# Patient Record
Sex: Male | Born: 1970 | Race: Black or African American | Hispanic: No | Smoking: Current every day smoker
Health system: Southern US, Community
[De-identification: ages and names within clinical notes are randomized; demographics above are authoritative.]

## PROBLEM LIST (undated history)

## (undated) DIAGNOSIS — K08409 Partial loss of teeth, unspecified cause, unspecified class: Secondary | ICD-10-CM

## (undated) DIAGNOSIS — K219 Gastro-esophageal reflux disease without esophagitis: Secondary | ICD-10-CM

## (undated) DIAGNOSIS — I10 Essential (primary) hypertension: Secondary | ICD-10-CM

## (undated) DIAGNOSIS — R251 Tremor, unspecified: Secondary | ICD-10-CM

## (undated) DIAGNOSIS — K649 Unspecified hemorrhoids: Secondary | ICD-10-CM

## (undated) DIAGNOSIS — E119 Type 2 diabetes mellitus without complications: Secondary | ICD-10-CM

## (undated) DIAGNOSIS — M542 Cervicalgia: Secondary | ICD-10-CM

## (undated) DIAGNOSIS — R0989 Other specified symptoms and signs involving the circulatory and respiratory systems: Secondary | ICD-10-CM

## (undated) DIAGNOSIS — Z8659 Personal history of other mental and behavioral disorders: Secondary | ICD-10-CM

## (undated) HISTORY — PX: OTHER SURGICAL HISTORY: SHX169

## (undated) HISTORY — PX: HEMORRHOID BANDING: SHX5850

## (undated) HISTORY — DX: Tremor, unspecified: R25.1

## (undated) HISTORY — DX: Unspecified hemorrhoids: K64.9

## (undated) HISTORY — DX: Other specified symptoms and signs involving the circulatory and respiratory systems: R09.89

## (undated) HISTORY — DX: Partial loss of teeth, unspecified cause, unspecified class: K08.409

---

## 2001-11-30 ENCOUNTER — Encounter: Payer: Self-pay | Admitting: Emergency Medicine

## 2001-11-30 ENCOUNTER — Emergency Department (HOSPITAL_COMMUNITY): Admission: EM | Admit: 2001-11-30 | Discharge: 2001-11-30 | Payer: Self-pay | Admitting: Emergency Medicine

## 2003-11-30 ENCOUNTER — Emergency Department (HOSPITAL_COMMUNITY): Admission: EM | Admit: 2003-11-30 | Discharge: 2003-11-30 | Payer: Self-pay | Admitting: Emergency Medicine

## 2004-03-05 ENCOUNTER — Emergency Department (HOSPITAL_COMMUNITY): Admission: EM | Admit: 2004-03-05 | Discharge: 2004-03-05 | Payer: Self-pay | Admitting: Emergency Medicine

## 2004-04-24 ENCOUNTER — Emergency Department (HOSPITAL_COMMUNITY): Admission: EM | Admit: 2004-04-24 | Discharge: 2004-04-24 | Payer: Self-pay | Admitting: Emergency Medicine

## 2004-11-10 ENCOUNTER — Ambulatory Visit (HOSPITAL_COMMUNITY): Admission: RE | Admit: 2004-11-10 | Discharge: 2004-11-10 | Payer: Self-pay | Admitting: Family Medicine

## 2005-07-31 ENCOUNTER — Ambulatory Visit: Payer: Self-pay | Admitting: Family Medicine

## 2005-08-14 ENCOUNTER — Ambulatory Visit: Payer: Self-pay | Admitting: Family Medicine

## 2005-08-15 ENCOUNTER — Ambulatory Visit: Payer: Self-pay | Admitting: Cardiology

## 2005-08-15 ENCOUNTER — Ambulatory Visit (HOSPITAL_COMMUNITY): Admission: RE | Admit: 2005-08-15 | Discharge: 2005-08-15 | Payer: Self-pay | Admitting: Family Medicine

## 2005-08-21 ENCOUNTER — Emergency Department (HOSPITAL_COMMUNITY): Admission: EM | Admit: 2005-08-21 | Discharge: 2005-08-21 | Payer: Self-pay | Admitting: Emergency Medicine

## 2005-08-28 ENCOUNTER — Ambulatory Visit (HOSPITAL_COMMUNITY): Admission: RE | Admit: 2005-08-28 | Discharge: 2005-08-28 | Payer: Self-pay | Admitting: Family Medicine

## 2005-08-28 ENCOUNTER — Ambulatory Visit: Payer: Self-pay | Admitting: Family Medicine

## 2007-11-10 ENCOUNTER — Emergency Department (HOSPITAL_COMMUNITY): Admission: EM | Admit: 2007-11-10 | Discharge: 2007-11-10 | Payer: Self-pay | Admitting: Emergency Medicine

## 2008-01-21 ENCOUNTER — Encounter: Payer: Self-pay | Admitting: Orthopedic Surgery

## 2008-01-21 ENCOUNTER — Emergency Department (HOSPITAL_COMMUNITY): Admission: EM | Admit: 2008-01-21 | Discharge: 2008-01-21 | Payer: Self-pay | Admitting: Emergency Medicine

## 2008-01-30 ENCOUNTER — Ambulatory Visit: Payer: Self-pay | Admitting: Orthopedic Surgery

## 2008-01-30 DIAGNOSIS — M79609 Pain in unspecified limb: Secondary | ICD-10-CM

## 2008-09-29 ENCOUNTER — Emergency Department (HOSPITAL_COMMUNITY): Admission: EM | Admit: 2008-09-29 | Discharge: 2008-09-29 | Payer: Self-pay | Admitting: Emergency Medicine

## 2009-02-22 ENCOUNTER — Emergency Department (HOSPITAL_COMMUNITY): Admission: EM | Admit: 2009-02-22 | Discharge: 2009-02-23 | Payer: Self-pay | Admitting: Family Medicine

## 2009-10-10 ENCOUNTER — Emergency Department (HOSPITAL_COMMUNITY): Admission: EM | Admit: 2009-10-10 | Discharge: 2009-10-11 | Payer: Self-pay | Admitting: Emergency Medicine

## 2009-10-31 ENCOUNTER — Emergency Department (HOSPITAL_COMMUNITY): Admission: EM | Admit: 2009-10-31 | Discharge: 2009-10-31 | Payer: Self-pay | Admitting: Emergency Medicine

## 2010-06-25 LAB — URINALYSIS, ROUTINE W REFLEX MICROSCOPIC
Bilirubin Urine: NEGATIVE
Ketones, ur: NEGATIVE mg/dL
Nitrite: NEGATIVE
Protein, ur: NEGATIVE mg/dL
Specific Gravity, Urine: 1.02 (ref 1.005–1.030)
pH: 5.5 (ref 5.0–8.0)

## 2010-06-25 LAB — URINE MICROSCOPIC-ADD ON

## 2010-06-25 LAB — URINE CULTURE

## 2010-06-26 LAB — DIFFERENTIAL
Basophils Absolute: 0 10*3/uL (ref 0.0–0.1)
Eosinophils Absolute: 0.5 10*3/uL (ref 0.0–0.7)
Lymphs Abs: 3.2 10*3/uL (ref 0.7–4.0)
Neutro Abs: 2.7 10*3/uL (ref 1.7–7.7)

## 2010-06-26 LAB — CBC
HCT: 44.9 % (ref 39.0–52.0)
MCH: 30.9 pg (ref 26.0–34.0)
MCHC: 34.9 g/dL (ref 30.0–36.0)
RDW: 13.7 % (ref 11.5–15.5)

## 2010-06-26 LAB — POCT CARDIAC MARKERS
CKMB, poc: 1.1 ng/mL (ref 1.0–8.0)
Myoglobin, poc: 113 ng/mL (ref 12–200)

## 2010-06-26 LAB — BASIC METABOLIC PANEL
Chloride: 102 mEq/L (ref 96–112)
Creatinine, Ser: 1.33 mg/dL (ref 0.4–1.5)
GFR calc Af Amer: >60 mL/min (ref >60)
Potassium: 3.4 mEq/L — ABNORMAL LOW (ref 3.5–5.1)

## 2010-07-13 LAB — BASIC METABOLIC PANEL
BUN: 10 mg/dL (ref 6–23)
CO2: 30 mEq/L (ref 19–32)
Calcium: 9.7 mg/dL (ref 8.4–10.5)
Chloride: 102 mEq/L (ref 96–112)
GFR calc Af Amer: >60 mL/min (ref >60)
GFR calc non Af Amer: >60 mL/min (ref >60)
Glucose, Bld: 170 mg/dL — ABNORMAL HIGH (ref 70–99)
Potassium: 3.3 mEq/L — ABNORMAL LOW (ref 3.5–5.1)

## 2010-07-13 LAB — DIFFERENTIAL
Basophils Absolute: 0 10*3/uL (ref 0.0–0.1)
Basophils Relative: 1 % (ref 0–1)
Lymphs Abs: 3.1 10*3/uL (ref 0.7–4.0)
Monocytes Relative: 5 % (ref 3–12)
Neutro Abs: 3.2 10*3/uL (ref 1.7–7.7)
Neutrophils Relative %: 44 % (ref 43–77)

## 2010-07-13 LAB — POCT CARDIAC MARKERS
Myoglobin, poc: 174 ng/mL (ref 12–200)
Troponin i, poc: <0.05 ng/mL (ref 0.00–0.09)
Troponin i, poc: <0.05 ng/mL (ref 0.00–0.09)

## 2010-07-13 LAB — CBC
Hemoglobin: 16.1 g/dL (ref 13.0–17.0)
RBC: 5.19 MIL/uL (ref 4.22–5.81)

## 2010-08-26 NOTE — Procedures (Signed)
[name], Daniel Nicholson NO.:  [id number]   MEDICAL RECORD NO.:  [id number]          PATIENT TYPE:  OUT   LOCATION:  RAD                           FACILITY:  [facility]   PHYSICIAN:  [place] Bing, M.D. [facility] OF BIRTH:  [birth year]   DATE OF PROCEDURE:  08/15/2005  DATE OF DISCHARGE:                                  ECHOCARDIOGRAM   REFERRING:  __________   CLINICAL DATA:  A 40 year old gentleman with syncope and hypertension.   M-MODE:  Aorta 3.7, left atrium 3.9, septum 1.5, posterior wall 1.2, LV  diastole 4, LV systole 3.1.   1.  Technically adequate echocardiographic study.  2.  Normal left atrium, right atrium and right ventricle.  3.  Mild aortic valvular sclerosis with normal function; aortic root      diameter at the upper limit of normal; mild calcification of the wall.  4.  Normal mitral, tricuspid, and pulmonic valve; normal proximal pulmonary      artery.  5.  Normal left ventricular size; mild hypertrophy; normal regional and      global function.  6.  Normal IVC.      [place] Bing, M.D. [facility]  Electronically Signed     RR/MEDQ  D:  08/15/2005  T:  08/16/2005  Job:  [id number]

## 2010-10-16 ENCOUNTER — Emergency Department (HOSPITAL_COMMUNITY)
Admission: EM | Admit: 2010-10-16 | Discharge: 2010-10-16 | Disposition: A | Discharge status: Home / Self Care | Payer: Medicaid Other | Attending: Emergency Medicine | Admitting: Emergency Medicine

## 2010-10-16 ENCOUNTER — Encounter: Payer: Self-pay | Admitting: Emergency Medicine

## 2010-10-16 ENCOUNTER — Emergency Department (HOSPITAL_COMMUNITY): Payer: Medicaid Other

## 2010-10-16 DIAGNOSIS — M778 Other enthesopathies, not elsewhere classified: Secondary | ICD-10-CM

## 2010-10-16 DIAGNOSIS — M109 Gout, unspecified: Secondary | ICD-10-CM | POA: Insufficient documentation

## 2010-10-16 DIAGNOSIS — K219 Gastro-esophageal reflux disease without esophagitis: Secondary | ICD-10-CM | POA: Insufficient documentation

## 2010-10-16 DIAGNOSIS — M65839 Other synovitis and tenosynovitis, unspecified forearm: Secondary | ICD-10-CM | POA: Insufficient documentation

## 2010-10-16 DIAGNOSIS — I10 Essential (primary) hypertension: Secondary | ICD-10-CM | POA: Insufficient documentation

## 2010-10-16 HISTORY — DX: Essential (primary) hypertension: I10

## 2010-10-16 HISTORY — DX: Gastro-esophageal reflux disease without esophagitis: K21.9

## 2010-10-16 MED ORDER — HYDROCODONE-ACETAMINOPHEN 5-325 MG PO TABS: ORAL_TABLET | ORAL | Status: DC

## 2010-10-16 MED ORDER — IBUPROFEN 800 MG PO TABS: 800.0000 mg | ORAL_TABLET | Freq: Three times a day (TID) | ORAL | Status: DC

## 2010-10-16 NOTE — ED Provider Notes (Signed)
History     Chief Complaint  Patient presents with  . Wrist Pain   HPI Comments: Patient c/o pain to right wrist for two days.  States the pain began after excessive use of the wrist from playing video games.  Denies swelling, numbness or weakness  Patient is a 40 y.o. male presenting with wrist pain. The history is provided by the patient.  Wrist Pain This is a new problem. The current episode started in the past 7 days. The problem occurs constantly. The problem has been unchanged. Associated symptoms include arthralgias. Pertinent negatives include no fever, myalgias, neck pain, numbness, rash, vomiting or weakness. The symptoms are aggravated by twisting (movement). He has tried nothing for the symptoms. The treatment provided no relief.    Past Medical History  Diagnosis Date  . Hypertension   . Acid reflux   . Gout     History reviewed. No pertinent past surgical history.  Family History  Problem Relation Age of Onset  . Hypertension Mother     History  Substance Use Topics  . Smoking status: Smoker, Current Status Unknown -- 0.2 packs/day  . Smokeless tobacco: Not on file  . Alcohol Use: No      Review of Systems  Constitutional: Negative.  Negative for fever.  HENT: Negative.  Negative for neck pain.   Respiratory: Negative.   Cardiovascular: Negative.   Gastrointestinal: Negative for vomiting.  Musculoskeletal: Positive for arthralgias. Negative for myalgias and gait problem.  Skin: Negative.  Negative for rash.  Neurological: Negative for weakness and numbness.    Physical Exam  BP 114/69  Pulse 80  Temp(Src) 98.5 F (36.9 C) (Oral)  Resp 20  Ht 6\' 4"  (1.93 m)  Wt 300 lb (136.079 kg)  BMI 36.52 kg/m2  SpO2 99%  Physical Exam  Constitutional: He is oriented to person, place, and time. Vital signs are normal. He appears well-developed and well-nourished.  Non-toxic appearance.  HENT:  Head: Normocephalic and atraumatic.  Eyes: Pupils are equal,  round, and reactive to light.  Neck: Normal range of motion. Neck supple.  Cardiovascular: Normal rate, regular rhythm and normal heart sounds.   Pulmonary/Chest: Effort normal and breath sounds normal.  Musculoskeletal:       Right wrist: He exhibits decreased range of motion and tenderness. He exhibits no swelling, no effusion, no crepitus and no deformity.       ttp of the right wrist.  No erythema, crepitus, numbness or weakness.  Radial pulse is strong.  Distal sensation intact.  CR<2 sec  Lymphadenopathy:    He has no cervical adenopathy.  Neurological: He is alert and oriented to person, place, and time.  Skin: Skin is warm and dry.    ED Course  Procedures  MDM  No erythema, excessive warmth to suggest cellulitis or gout.  Likely tendonitis.  No median nerve tenderness or distal numbness to suggest carpal tunnel       Brienne Liguori L. [place] Winters, [place] 10/16/10 1551

## 2010-10-16 NOTE — ED Notes (Signed)
Pt c/o r wrist pain since yesterday. Denies injury.

## 2010-10-19 ENCOUNTER — Encounter (HOSPITAL_COMMUNITY): Payer: Self-pay | Admitting: *Deleted

## 2010-10-19 ENCOUNTER — Emergency Department (HOSPITAL_COMMUNITY)
Admission: EM | Admit: 2010-10-19 | Discharge: 2010-10-19 | Disposition: A | Discharge status: Home / Self Care | Payer: Medicaid Other | Attending: Emergency Medicine | Admitting: Emergency Medicine

## 2010-10-19 ENCOUNTER — Emergency Department (HOSPITAL_COMMUNITY): Payer: Medicaid Other

## 2010-10-19 DIAGNOSIS — I10 Essential (primary) hypertension: Secondary | ICD-10-CM | POA: Insufficient documentation

## 2010-10-19 DIAGNOSIS — F17200 Nicotine dependence, unspecified, uncomplicated: Secondary | ICD-10-CM | POA: Insufficient documentation

## 2010-10-19 DIAGNOSIS — M19039 Primary osteoarthritis, unspecified wrist: Secondary | ICD-10-CM | POA: Insufficient documentation

## 2010-10-19 DIAGNOSIS — M1990 Unspecified osteoarthritis, unspecified site: Secondary | ICD-10-CM

## 2010-10-19 MED ORDER — OXYCODONE-ACETAMINOPHEN 5-325 MG PO TABS: 1.0000 | ORAL_TABLET | Freq: Four times a day (QID) | ORAL | Status: AC | PRN

## 2010-10-19 MED ORDER — IBUPROFEN 800 MG PO TABS: 800.0000 mg | ORAL_TABLET | Freq: Three times a day (TID) | ORAL | Status: AC

## 2010-10-19 MED ORDER — HYDROCODONE-ACETAMINOPHEN 5-325 MG PO TABS
2.0000 | ORAL_TABLET | Freq: Once | ORAL | Status: AC
  Administered 2010-10-19: 2 via ORAL
  Filled 2010-10-19: qty 2

## 2010-10-19 MED ORDER — DEXAMETHASONE SODIUM PHOSPHATE 4 MG/ML IJ SOLN
8.0000 mg | Freq: Once | INTRAMUSCULAR | Status: AC
  Administered 2010-10-19: 8 mg via INTRAMUSCULAR
  Filled 2010-10-19: qty 2

## 2010-10-19 NOTE — ED Notes (Signed)
Pt c/o worsening pain in his rt wrist since Sunday. States that he was seen in the ED on Sunday and told that he had tendonitis. Pt has on wrist brace but states that something is just not right.

## 2010-10-19 NOTE — ED Notes (Signed)
Patient is resting comfortably. 

## 2010-10-19 NOTE — ED Provider Notes (Signed)
History     Chief Complaint  Patient presents with  . Wrist Pain   Patient is a 40 y.o. male presenting with wrist pain. The history is provided by the patient and the spouse.  Wrist Pain This is a recurrent problem. The current episode started 1 to 4 weeks ago. The problem occurs daily. The problem has been gradually worsening. Associated symptoms include arthralgias. Pertinent negatives include no abdominal pain, chest pain, coughing or neck pain. Exacerbated by: movement. He has tried ice, NSAIDs and oral narcotics for the symptoms. The treatment provided mild relief.    Past Medical History  Diagnosis Date  . Hypertension   . Acid reflux   . Gout     History reviewed. No pertinent past surgical history.  Family History  Problem Relation Age of Onset  . Hypertension Mother     History  Substance Use Topics  . Smoking status: Smoker, Current Status Unknown -- 0.2 packs/day  . Smokeless tobacco: Not on file  . Alcohol Use: No      Review of Systems  Constitutional: Positive for activity change.       All ROS Neg except as noted in HPI  HENT: Negative for nosebleeds and neck pain.   Eyes: Negative for photophobia and discharge.  Respiratory: Negative for cough, shortness of breath and wheezing.   Cardiovascular: Negative for chest pain and palpitations.  Gastrointestinal: Negative for abdominal pain and blood in stool.  Genitourinary: Negative for dysuria, frequency and hematuria.  Musculoskeletal: Positive for arthralgias. Negative for back pain.  Skin: Negative.   Neurological: Negative for dizziness, seizures and speech difficulty.  Psychiatric/Behavioral: Negative for hallucinations and confusion. The patient is nervous/anxious.     Physical Exam  Pulse 106  Temp(Src) 98.3 F (36.8 C) (Oral)  Resp 20  Ht 6\' 4"  (1.93 m)  Wt 300 lb (136.079 kg)  BMI 36.52 kg/m2  SpO2 98%  Physical Exam  Nursing note and vitals reviewed. Constitutional: He is oriented to  person, place, and time. He appears well-developed and well-nourished.  Non-toxic appearance.  HENT:  Head: Normocephalic.  Right Ear: Tympanic membrane and external ear normal.  Left Ear: Tympanic membrane and external ear normal.  Eyes: EOM and lids are normal. Pupils are equal, round, and reactive to light.  Neck: Normal range of motion. Neck supple. Carotid bruit is not present.  Cardiovascular: Normal rate, regular rhythm, normal heart sounds, intact distal pulses and normal pulses.   Pulmonary/Chest: Breath sounds normal. No respiratory distress.  Abdominal: Soft. Bowel sounds are normal. There is no tenderness. There is no guarding.  Musculoskeletal:       Rt wrist swollen. Painful to palpation or movement of the fingers and wrist.  Lymphadenopathy:       Head (right side): No submandibular adenopathy present.       Head (left side): No submandibular adenopathy present.    He has no cervical adenopathy.  Neurological: He is alert and oriented to person, place, and time. He has normal strength. No cranial nerve deficit or sensory deficit.  Skin: Skin is warm and dry.  Psychiatric: His speech is normal.    ED Course  Procedures  MDM I have reviewed nursing notes, vital signs, and all appropriate lab and imaging results for this patient.     Kathie Dike, [place] 10/19/10 1755

## 2010-10-25 NOTE — ED Provider Notes (Signed)
Medical screening examination/treatment/procedure(s) were performed by non-physician practitioner and as supervising physician I was immediately available for consultation/collaboration.  Joya Gaskins, MD 10/25/10 [phone]

## 2010-12-08 ENCOUNTER — Encounter (HOSPITAL_COMMUNITY): Payer: Self-pay | Admitting: Emergency Medicine

## 2010-12-08 ENCOUNTER — Emergency Department (HOSPITAL_COMMUNITY)
Admission: EM | Admit: 2010-12-08 | Discharge: 2010-12-08 | Disposition: A | Discharge status: Home / Self Care | Payer: Medicaid Other | Attending: Emergency Medicine | Admitting: Emergency Medicine

## 2010-12-08 DIAGNOSIS — K219 Gastro-esophageal reflux disease without esophagitis: Secondary | ICD-10-CM | POA: Insufficient documentation

## 2010-12-08 DIAGNOSIS — I10 Essential (primary) hypertension: Secondary | ICD-10-CM | POA: Insufficient documentation

## 2010-12-08 DIAGNOSIS — M109 Gout, unspecified: Secondary | ICD-10-CM | POA: Insufficient documentation

## 2010-12-08 DIAGNOSIS — F17200 Nicotine dependence, unspecified, uncomplicated: Secondary | ICD-10-CM | POA: Insufficient documentation

## 2010-12-08 MED ORDER — HYDROCODONE-ACETAMINOPHEN 5-325 MG PO TABS
2.0000 | ORAL_TABLET | Freq: Once | ORAL | Status: AC
  Administered 2010-12-08: 2 via ORAL
  Filled 2010-12-08: qty 2
  Filled 2010-12-08: qty 1

## 2010-12-08 MED ORDER — HYDROCODONE-ACETAMINOPHEN 5-325 MG PO TABS: 1.0000 | ORAL_TABLET | ORAL | Status: AC | PRN

## 2010-12-08 NOTE — ED Notes (Signed)
Into room to evaluate patient states right wrist started hurting three days ago but got worse tonight. Characterized as a constant throbbing, aching pain. Hurts on movement and palpation. Mild swelling to wrist in comparison to left wrist. Strong pulse in right wrist. Pain 10\10. pending md eval.

## 2010-12-08 NOTE — ED Notes (Signed)
Splint applied to right wrist. Able to wiggle fingers. Wife at bedside.

## 2010-12-08 NOTE — ED Notes (Signed)
MD strand at bedside to evaluate patient. Patient resting in bed on back. Denies any needs. No distress. Pain 10\10.

## 2010-12-08 NOTE — ED Provider Notes (Signed)
History     CSN: [id number] Arrival date & time: 12/08/2010 10:37 PM  Chief Complaint  Patient presents with  . Extremity Pain   HPI Comments: Seen 2317. Patient with h/o gout, on medications who has had right wrist pain and swelling x 3 days. Using colchicine, allopurinol and indocin without relief.  Patient is a 40 y.o. male presenting with extremity pain. The history is provided by the patient.  Extremity Pain This is a new (3 days) problem. The problem occurs constantly. The problem has not changed since onset.Pertinent negatives include no chest pain, no abdominal pain, no headaches and no shortness of breath. The symptoms are aggravated by bending. The symptoms are relieved by nothing. Treatments tried: colchicine, alopurinol, indocin. The treatment provided no relief.    Past Medical History  Diagnosis Date  . Hypertension   . Acid reflux   . Gout     History reviewed. No pertinent past surgical history.  Family History  Problem Relation Age of Onset  . Hypertension Mother     History  Substance Use Topics  . Smoking status: Smoker, Current Status Unknown -- 0.2 packs/day  . Smokeless tobacco: Not on file  . Alcohol Use: No      Review of Systems  Respiratory: Negative for shortness of breath.   Cardiovascular: Negative for chest pain.  Gastrointestinal: Negative for abdominal pain.  Neurological: Negative for headaches.  All other systems reviewed and are negative.    Physical Exam  BP 130/75  Pulse 107  Temp 98.7 F (37.1 C)  Resp 20  Ht 6\' 4"  (1.93 m)  Wt 300 lb (136.079 kg)  BMI 36.52 kg/m2  SpO2 100%  Physical Exam  Nursing note and vitals reviewed. Constitutional: He is oriented to person, place, and time. He appears well-developed and well-nourished.  HENT:  Head: Normocephalic and atraumatic.  Eyes: EOM are normal.  Neck: Normal range of motion.  Cardiovascular: Normal rate, normal heart sounds and intact distal pulses.     Pulmonary/Chest: Effort normal and breath sounds normal.  Musculoskeletal:       Right wrist with mild warmth, exquisite tenderness with palpation. FROM slowly with discomfort. Pulses 2+, cap refill brisk.  Neurological: He is alert and oriented to person, place, and time. He has normal reflexes.  Skin: Skin is warm and dry.    ED Course  Procedures  Patient with h/o gout here with gout to the right wrist. Given analgesics, Rx for same. Continued home medicines. Follow up with his doctor. MDM Reviewed: nursing note and vitals        Nicoletta Dress. Colon Branch, MD 12/08/10 2336

## 2010-12-08 NOTE — ED Notes (Signed)
Swelling right wrist, history of same

## 2010-12-31 NOTE — ED Provider Notes (Signed)
Medical screening examination/treatment/procedure(s) were performed by non-physician practitioner and as supervising physician I was immediately available for consultation/collaboration.   Hiliary Osorto W. Caleigha Zale, MD 12/31/10 0715 

## 2011-01-06 IMAGING — CT CT ABD-PELV W/O CM
2 of 4 series · 18 of 46 positions shown, 20 images · non-contrast
Comparison: None.

CLINICAL DATA: Hematuria

CT ABDOMEN AND PELVIS WITHOUT CONTRAST
TECHNIQUE: Multidetector CT imaging of the abdomen and pelvis was
performed following the standard protocol without intravenous
contrast.

[Series 2: standard/full over (age)lbs 5.0 · axial · 0.75mm/px · z∈[-525,-75]mm · 15 of 98 slices shown, 17 images]
[im 4/98  soft-tissue]
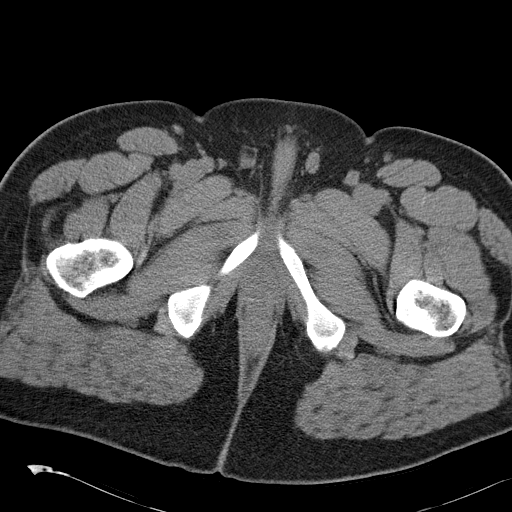
[im 4/98  bone]
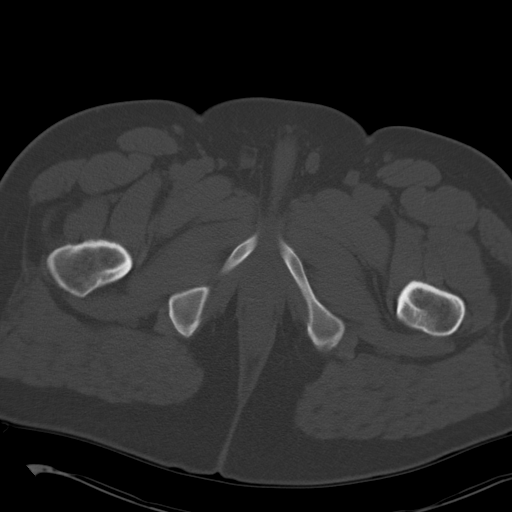
[im 12/98  soft-tissue]
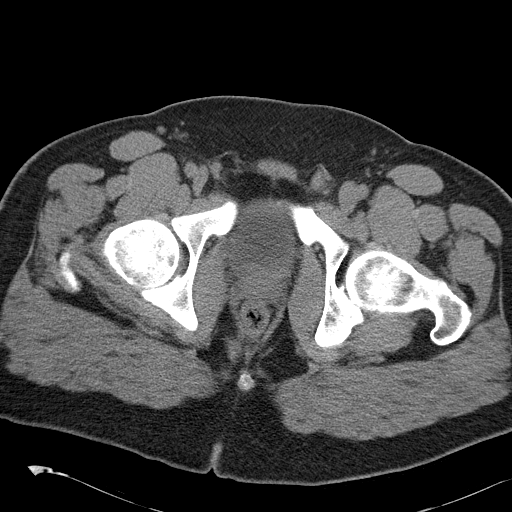
[im 20/98  soft-tissue]
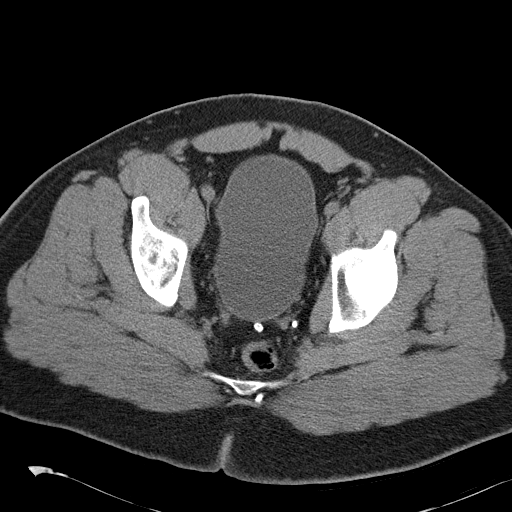
[im 24/98  soft-tissue]
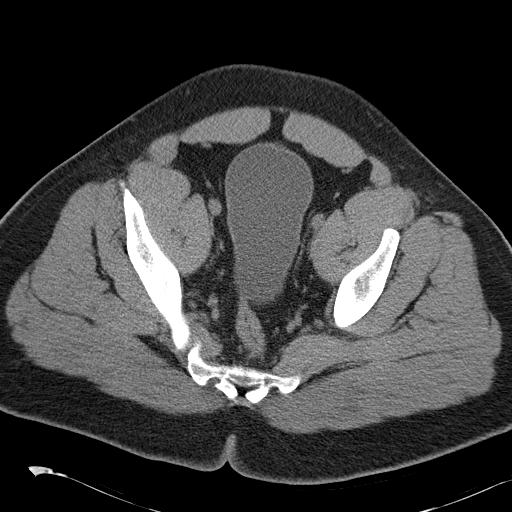
[im 32/98  soft-tissue]
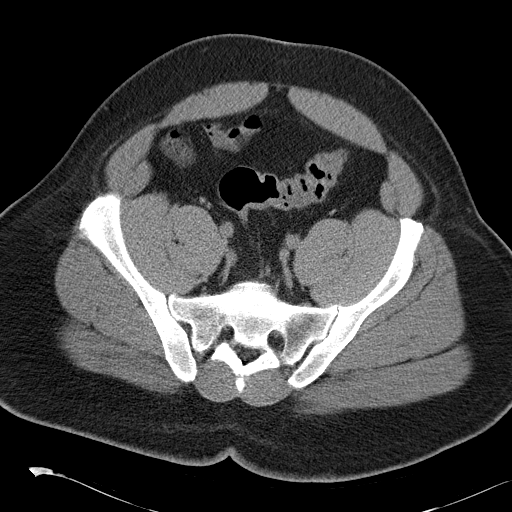
[im 35/98  soft-tissue]
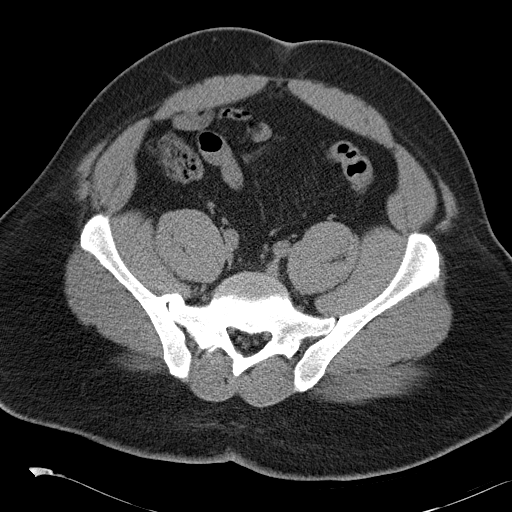
[im 43/98  soft-tissue]
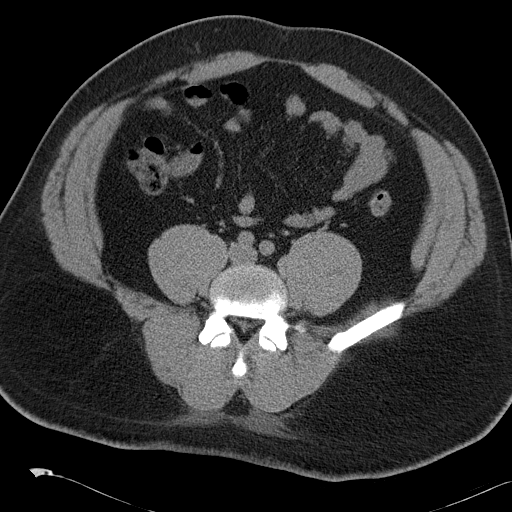
[im 51/98  soft-tissue]
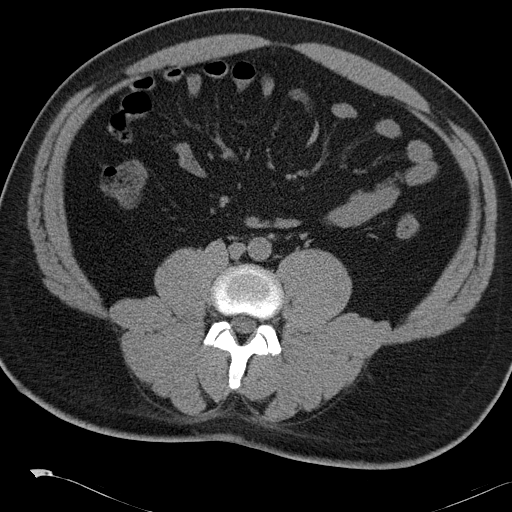
[im 55/98  soft-tissue]
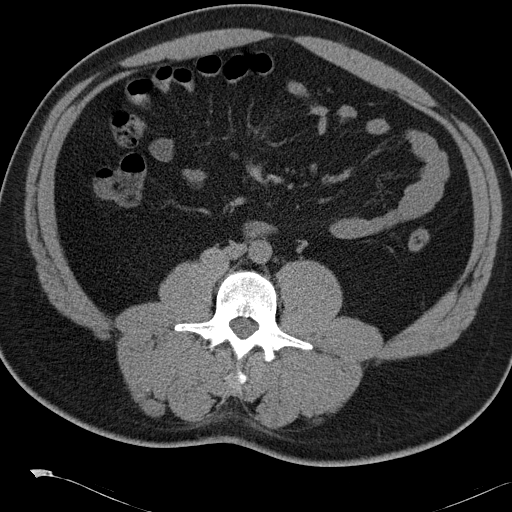
[im 55/98  bone]
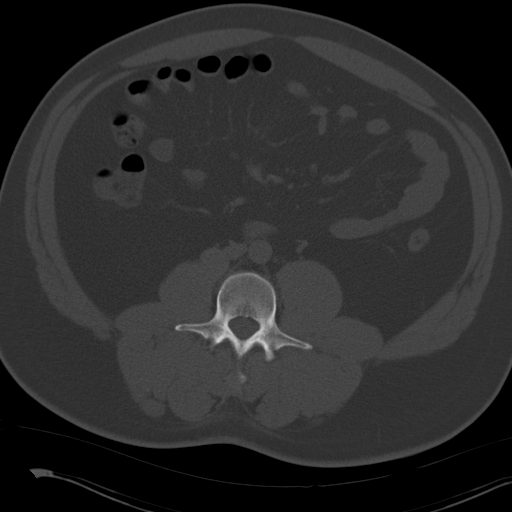
[im 63/98  soft-tissue]
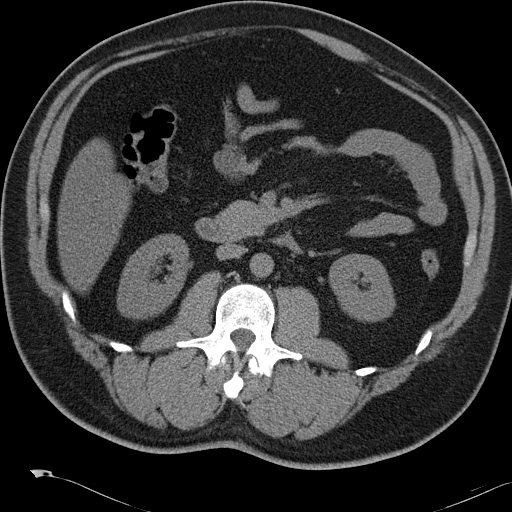
[im 66/98  soft-tissue]
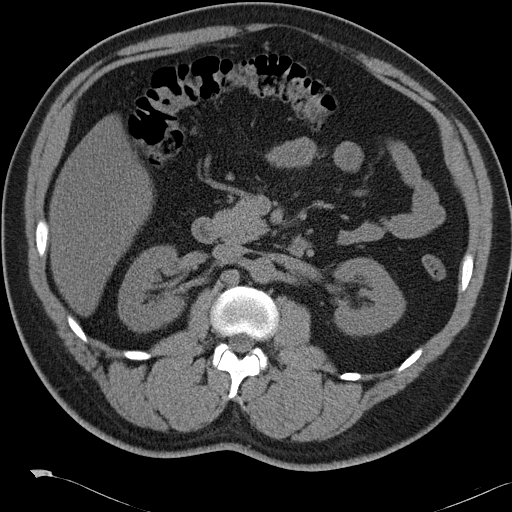
[im 74/98  soft-tissue]
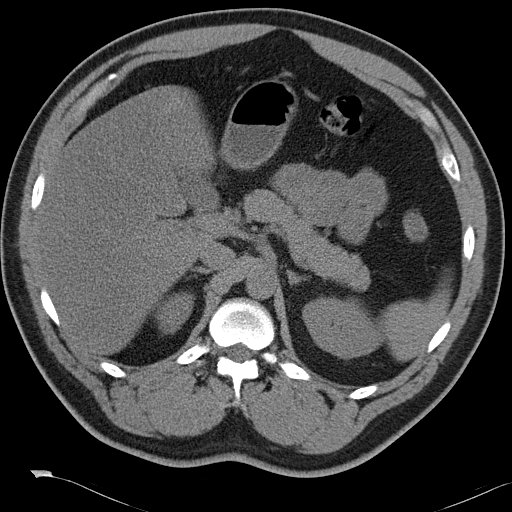
[im 82/98  soft-tissue]
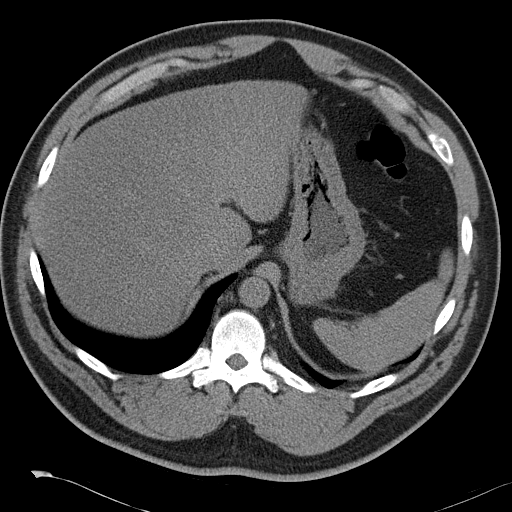
[im 86/98  soft-tissue]
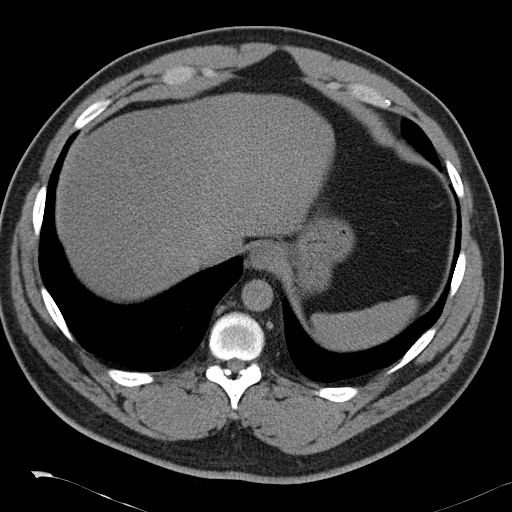
[im 94/98  soft-tissue]
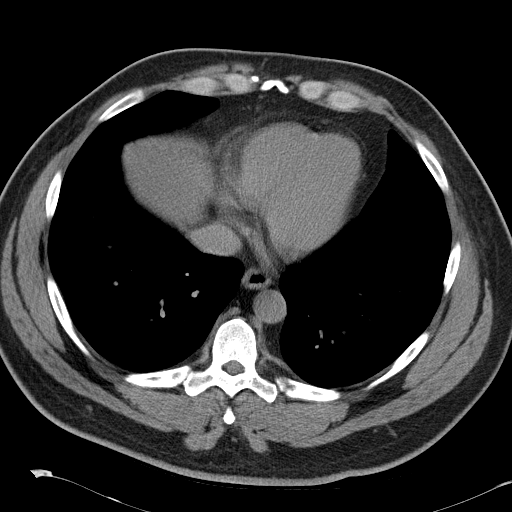

[Series 4: mpr coronal · coronal · 0.96mm/px · 3 of 98 slices shown]
[im 33/98  soft-tissue]
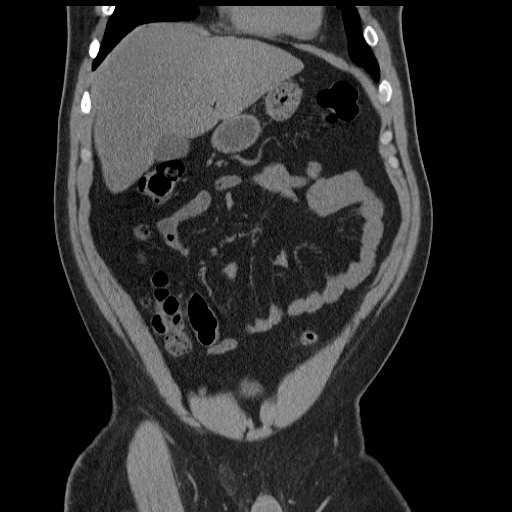
[im 44/98  soft-tissue]
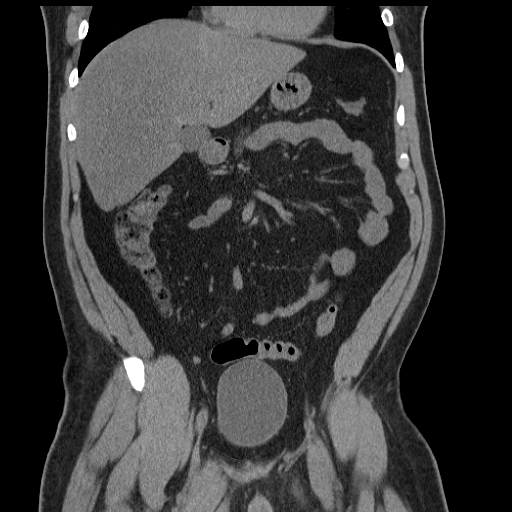
[im 54/98  soft-tissue]
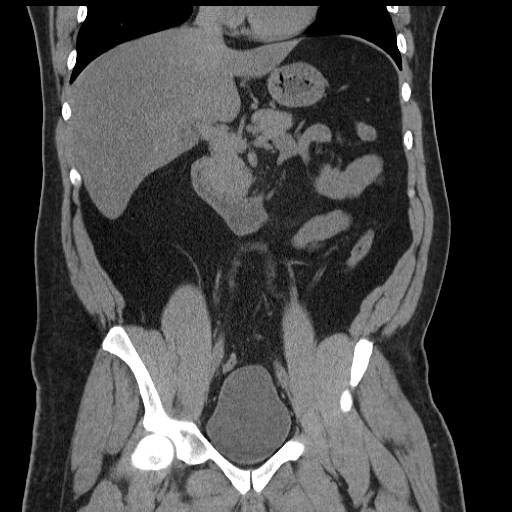

[18 of 46 positions shown; findings below may reference images not displayed]

FINDINGS: No hydronephrosis.  No ureteral calculus.  Diffuse fatty
infiltration of the liver.  Spleen, pancreas, adrenal glands,
gallbladder are within normal limits.  No free fluid.  Right
inguinal hernia containing only adipose tissue.  Normal appendix.
Bladder is distended.
IMPRESSION: No evidence of urinary calculus.

Fatty liver.

## 2011-01-10 ENCOUNTER — Encounter: Payer: Self-pay | Admitting: Orthopedic Surgery

## 2011-01-10 ENCOUNTER — Telehealth: Payer: Self-pay | Admitting: Orthopedic Surgery

## 2011-01-10 ENCOUNTER — Ambulatory Visit (INDEPENDENT_AMBULATORY_CARE_PROVIDER_SITE_OTHER): Payer: Medicaid Other | Admitting: Orthopedic Surgery

## 2011-01-10 VITALS — BP 132/98 | Ht 76.0 in | Wt 300.0 lb

## 2011-01-10 DIAGNOSIS — M109 Gout, unspecified: Secondary | ICD-10-CM

## 2011-01-10 DIAGNOSIS — M19039 Primary osteoarthritis, unspecified wrist: Secondary | ICD-10-CM

## 2011-01-10 MED ORDER — PREDNISONE 10 MG PO KIT: 10.0000 mg | PACK | ORAL | Status: DC

## 2011-01-10 NOTE — Patient Instructions (Signed)
When you get an attack  Continue the medications you are on and take the new medication as well  Wear the brace until the attack goes away

## 2011-01-10 NOTE — Progress Notes (Signed)
   New patient  Chief complaint RIGHT wrist pain  The patient is having pain on the aspect of his RIGHT wrist which came on suddenly but has now resolved.  It is associated with swelling and no trauma.  He was treated or is being treated with antigout medications  Review of systems is positive for weight gain and chills, chest pain and fainting, shortness of breath and wheezing, heartburn blood in the stool joint pain joint swelling skin poor healing, numbness tingling unsteady gait and tremor, nervousness and anxiety with depression as well as heat and cold intolerance  Physical Exam(12) GENERAL: normal development   CDV: pulses are normal   Skin: normal  Lymph: nodes were not palpable/normal  Psychiatric: awake, alert and oriented  Neuro: normal sensation  MSK Ambulation is normal 1 Wrist examination shows no swelling or tenderness at this time.  Full range of motion is noted.  The wrist is stable skin is intact.  Strength of the hand is normal.  Assessment: Hospital x-ray shows carpal arthritis.    Plan: Resolved gout attack recommend prednisone and bracing when he does get an attack.

## 2011-01-10 NOTE — Telephone Encounter (Addendum)
April at [facility] needs you to call her for clarification on the Predisone prescription she received for Aggie Cosier. Her # [phone].  She has called back and said that she just needs to know if 6-day or 12-day dose pack?

## 2011-05-19 ENCOUNTER — Emergency Department (HOSPITAL_COMMUNITY)
Admission: EM | Admit: 2011-05-19 | Discharge: 2011-05-19 | Disposition: A | Discharge status: Home / Self Care | Payer: Medicaid Other | Attending: Emergency Medicine | Admitting: Emergency Medicine

## 2011-05-19 ENCOUNTER — Encounter (HOSPITAL_COMMUNITY): Payer: Self-pay

## 2011-05-19 ENCOUNTER — Other Ambulatory Visit: Payer: Self-pay

## 2011-05-19 DIAGNOSIS — R739 Hyperglycemia, unspecified: Secondary | ICD-10-CM

## 2011-05-19 DIAGNOSIS — R5381 Other malaise: Secondary | ICD-10-CM | POA: Insufficient documentation

## 2011-05-19 DIAGNOSIS — I10 Essential (primary) hypertension: Secondary | ICD-10-CM | POA: Insufficient documentation

## 2011-05-19 DIAGNOSIS — K219 Gastro-esophageal reflux disease without esophagitis: Secondary | ICD-10-CM | POA: Insufficient documentation

## 2011-05-19 DIAGNOSIS — E1169 Type 2 diabetes mellitus with other specified complication: Secondary | ICD-10-CM | POA: Insufficient documentation

## 2011-05-19 DIAGNOSIS — R42 Dizziness and giddiness: Secondary | ICD-10-CM | POA: Insufficient documentation

## 2011-05-19 LAB — URINALYSIS, ROUTINE W REFLEX MICROSCOPIC
Bilirubin Urine: NEGATIVE
Glucose, UA: >1000 mg/dL — AB
Hgb urine dipstick: NEGATIVE
Protein, ur: NEGATIVE mg/dL
Urobilinogen, UA: 0.2 mg/dL (ref 0.0–1.0)

## 2011-05-19 LAB — CBC
MCV: 82.2 fL (ref 78.0–100.0)
Platelets: 184 10*3/uL (ref 150–400)
RBC: 5.38 MIL/uL (ref 4.22–5.81)
RDW: 12.8 % (ref 11.5–15.5)
WBC: 5.8 10*3/uL (ref 4.0–10.5)

## 2011-05-19 LAB — COMPREHENSIVE METABOLIC PANEL
ALT: 70 U/L — ABNORMAL HIGH (ref 0–53)
AST: 55 U/L — ABNORMAL HIGH (ref 0–37)
Albumin: 4.4 g/dL (ref 3.5–5.2)
Alkaline Phosphatase: 58 U/L (ref 39–117)
CO2: 25 mEq/L (ref 19–32)
Chloride: 92 mEq/L — ABNORMAL LOW (ref 96–112)
GFR calc non Af Amer: 71 mL/min — ABNORMAL LOW (ref >90)
Potassium: 3.8 mEq/L (ref 3.5–5.1)
Sodium: 131 mEq/L — ABNORMAL LOW (ref 135–145)
Total Bilirubin: 0.7 mg/dL (ref 0.3–1.2)

## 2011-05-19 LAB — CARDIAC PANEL(CRET KIN+CKTOT+MB+TROPI)
CK, MB: 2.6 ng/mL (ref 0.3–4.0)
Relative Index: 0.5 (ref 0.0–2.5)
Total CK: 526 U/L — ABNORMAL HIGH (ref 7–232)
Troponin I: <0.3 ng/mL (ref <0.30)

## 2011-05-19 LAB — GLUCOSE, CAPILLARY

## 2011-05-19 LAB — URINE MICROSCOPIC-ADD ON

## 2011-05-19 MED ORDER — INSULIN ASPART 100 UNIT/ML ~~LOC~~ SOLN
8.0000 [IU] | Freq: Once | SUBCUTANEOUS | Status: AC
  Administered 2011-05-19: 8 [IU] via SUBCUTANEOUS
  Filled 2011-05-19: qty 1

## 2011-05-19 MED ORDER — GLIPIZIDE 5 MG PO TABS: 5.0000 mg | ORAL_TABLET | ORAL | Status: DC

## 2011-05-19 MED ORDER — SODIUM CHLORIDE 0.9 % IV BOLUS (SEPSIS)
1000.0000 mL | Freq: Once | INTRAVENOUS | Status: AC
  Administered 2011-05-19: 1000 mL via INTRAVENOUS

## 2011-05-19 MED ORDER — METFORMIN HCL 500 MG PO TABS: 500.0000 mg | ORAL_TABLET | Freq: Two times a day (BID) | ORAL | Status: DC

## 2011-05-19 NOTE — ED Notes (Signed)
Pt DC to home with steady gait.  Pt verbalized understanding of DC instructions.

## 2011-05-19 NOTE — ED Notes (Signed)
Pt presents with weakness and dizziness x 3 days.

## 2011-05-19 NOTE — ED Notes (Signed)
MD at bedside. 

## 2011-05-19 NOTE — ED Provider Notes (Signed)
History     CSN: [id number]  Arrival date & time 05/19/11  1152   First MD Initiated Contact with Patient 05/19/11 1305      Chief Complaint  Patient presents with  . Weakness  . Dizziness    (Consider location/radiation/quality/duration/timing/severity/associated sxs/prior treatment) Patient is a 41 y.o. male presenting with weakness. The history is provided by the patient.  Weakness Primary symptoms do not include headaches or fever.  Additional symptoms include weakness. Additional symptoms do not include neck stiffness.  pt states has felt generally weak for 3 days. Constant. Denies exacerbating or alleviating factors. No focal weakness or numbness. Denies hx same. States has had scratchy sore throat for past few days, but currently improved. No trouble breathing or swallowing. No cough, nasal drainage, or other uri c/o. No fever or chills/sweats. Denies any current or recent chest discomfort. No unusual doe. No headache. No abd pain. No normal appetite. No nvd. No gu c/o. Denies recent change in meds. States occasionally light headed when stands. Denies change in stools, melena or rectal bleeding. Denies hx anemia.   Past Medical History  Diagnosis Date  . Hypertension   . Acid reflux   . Gout   . Poor circulation   . Acid reflux   . Tremors of nervous system     Past Surgical History  Procedure Date  . No past surgeries     Family History  Problem Relation Age of Onset  . Hypertension Mother   . Arthritis      History  Substance Use Topics  . Smoking status: Current Everyday Smoker -- 0.2 packs/day  . Smokeless tobacco: Not on file  . Alcohol Use: No      Review of Systems  Constitutional: Negative for fever, chills and diaphoresis.  HENT: Negative for neck pain and neck stiffness.   Eyes: Negative for redness and visual disturbance.  Respiratory: Negative for cough and shortness of breath.   Cardiovascular: Negative for chest pain, palpitations and leg  swelling.  Gastrointestinal: Negative for abdominal pain and blood in stool.  Genitourinary: Negative for dysuria and flank pain.  Musculoskeletal: Negative for back pain.  Skin: Negative for rash.  Neurological: Positive for weakness. Negative for numbness and headaches.  Hematological: Does not bruise/bleed easily.  Psychiatric/Behavioral: Negative for confusion.    Allergies  Review of patient's allergies indicates no known allergies.  Home Medications   Current Outpatient Rx  Name Route Sig Dispense Refill  . ALLOPURINOL 300 MG PO TABS Oral Take 300 mg by mouth daily.      . COLCHICINE 0.6 MG PO TABS Oral Take 0.6 mg by mouth daily.      [name] HYDROCHLOROTHIAZIDE 25 MG PO TABS Oral Take 25 mg by mouth daily.      . INDOMETHACIN 25 MG PO CAPS Oral Take 25 mg by mouth 2 (two) times daily with a meal.      . OMEPRAZOLE 20 MG PO CPDR Oral Take 20 mg by mouth daily.        BP 110/87  Pulse 120  Temp(Src) 98.2 F (36.8 C) (Oral)  Resp 20  Ht 6\' 2"  (1.88 m)  Wt 300 lb (136.079 kg)  BMI 38.52 kg/m2  SpO2 97%  Physical Exam  Nursing note and vitals reviewed. Constitutional: He is oriented to person, place, and time. He appears well-developed and well-nourished. No distress.  HENT:  Head: Atraumatic.  Right Ear: External ear normal.  Left Ear: External ear normal.  Eyes: Pupils  are equal, round, and reactive to light.  Neck: Normal range of motion. Neck supple. No tracheal deviation present.       No bruit  Cardiovascular: Normal rate, regular rhythm, normal heart sounds and intact distal pulses.  Exam reveals no gallop and no friction rub.   No murmur heard. Pulmonary/Chest: Effort normal and breath sounds normal. No accessory muscle usage. No respiratory distress.  Abdominal: Soft. Bowel sounds are normal. He exhibits no distension and no mass. There is no tenderness. There is no rebound and no guarding.  Musculoskeletal: Normal range of motion. He exhibits no edema and no  tenderness.  Neurological: He is alert and oriented to person, place, and time.       Motor intact bil.   Skin: Skin is warm and dry.  Psychiatric: He has a normal mood and affect.    ED Course  Procedures (including critical care time)   Labs Reviewed  CARDIAC PANEL(CRET KIN+CKTOT+MB+TROPI)  CBC  COMPREHENSIVE METABOLIC PANEL  URINALYSIS, ROUTINE W REFLEX MICROSCOPIC   Results for orders placed during the hospital encounter of 05/19/11  CARDIAC PANEL(CRET KIN+CKTOT+MB+TROPI)      Component Value Range   Total CK 526 (*) 7 - 232 (U/L)   CK, MB 2.6  0.3 - 4.0 (ng/mL)   Troponin I <0.30  <0.30 (ng/mL)   Relative Index 0.5  0.0 - 2.5   CBC      Component Value Range   WBC 5.8  4.0 - 10.5 (K/uL)   RBC 5.38  4.22 - 5.81 (MIL/uL)   Hemoglobin 15.8  13.0 - 17.0 (g/dL)   HCT 41.3  24.4 - 01.0 (%)   MCV 82.2  78.0 - 100.0 (fL)   MCH 29.4  26.0 - 34.0 (pg)   MCHC 35.7  30.0 - 36.0 (g/dL)   RDW 27.2  53.6 - 64.4 (%)   Platelets 184  150 - 400 (K/uL)  COMPREHENSIVE METABOLIC PANEL      Component Value Range   Sodium 131 (*) 135 - 145 (mEq/L)   Potassium 3.8  3.5 - 5.1 (mEq/L)   Chloride 92 (*) 96 - 112 (mEq/L)   CO2 25  19 - 32 (mEq/L)   Glucose, Bld 485 (*) 70 - 99 (mg/dL)   BUN 14  6 - 23 (mg/dL)   Creatinine, Ser 0.34  0.50 - 1.35 (mg/dL)   Calcium 9.9  8.4 - 74.2 (mg/dL)   Total Protein 7.5  6.0 - 8.3 (g/dL)   Albumin 4.4  3.5 - 5.2 (g/dL)   AST 55 (*) 0 - 37 (U/L)   ALT 70 (*) 0 - 53 (U/L)   Alkaline Phosphatase 58  39 - 117 (U/L)   Total Bilirubin 0.7  0.3 - 1.2 (mg/dL)   GFR calc non Af Amer 71 (*) >90 (mL/min)   GFR calc Af Amer 83 (*) >90 (mL/min)        MDM  Labs.    Date: 05/19/2011  Rate: 88  Rhythm: normal sinus rhythm  QRS Axis: normal  Intervals: normal  ST/T Wave abnormalities: normal  Conduction Disutrbances:none  Narrative Interpretation:   Old EKG Reviewed: unchanged   Glucose 485. Denies hx diabetes, prior blood sugar on labs noted in  170 range.  +fam hx dm.  Iv ns bolus. Discussed labs w pt. pcp is Dr Felecia Shelling.   Recheck bs improved, no nv.   Suzi Roots, MD 05/19/11 [phone]

## 2012-07-08 ENCOUNTER — Emergency Department (HOSPITAL_COMMUNITY): Payer: Medicaid Other

## 2012-07-08 ENCOUNTER — Emergency Department (HOSPITAL_COMMUNITY)
Admission: EM | Admit: 2012-07-08 | Discharge: 2012-07-08 | Disposition: A | Discharge status: Home / Self Care | Payer: Medicaid Other | Attending: Emergency Medicine | Admitting: Emergency Medicine

## 2012-07-08 ENCOUNTER — Encounter (HOSPITAL_COMMUNITY): Payer: Self-pay | Admitting: *Deleted

## 2012-07-08 DIAGNOSIS — IMO0002 Reserved for concepts with insufficient information to code with codable children: Secondary | ICD-10-CM | POA: Insufficient documentation

## 2012-07-08 DIAGNOSIS — I10 Essential (primary) hypertension: Secondary | ICD-10-CM | POA: Insufficient documentation

## 2012-07-08 DIAGNOSIS — F17200 Nicotine dependence, unspecified, uncomplicated: Secondary | ICD-10-CM | POA: Insufficient documentation

## 2012-07-08 DIAGNOSIS — K219 Gastro-esophageal reflux disease without esophagitis: Secondary | ICD-10-CM | POA: Insufficient documentation

## 2012-07-08 DIAGNOSIS — M25511 Pain in right shoulder: Secondary | ICD-10-CM

## 2012-07-08 DIAGNOSIS — E119 Type 2 diabetes mellitus without complications: Secondary | ICD-10-CM | POA: Insufficient documentation

## 2012-07-08 DIAGNOSIS — M25519 Pain in unspecified shoulder: Secondary | ICD-10-CM | POA: Insufficient documentation

## 2012-07-08 DIAGNOSIS — Z8669 Personal history of other diseases of the nervous system and sense organs: Secondary | ICD-10-CM | POA: Insufficient documentation

## 2012-07-08 DIAGNOSIS — L02412 Cutaneous abscess of left axilla: Secondary | ICD-10-CM

## 2012-07-08 DIAGNOSIS — Z79899 Other long term (current) drug therapy: Secondary | ICD-10-CM | POA: Insufficient documentation

## 2012-07-08 DIAGNOSIS — M109 Gout, unspecified: Secondary | ICD-10-CM | POA: Insufficient documentation

## 2012-07-08 DIAGNOSIS — Z8679 Personal history of other diseases of the circulatory system: Secondary | ICD-10-CM | POA: Insufficient documentation

## 2012-07-08 HISTORY — DX: Type 2 diabetes mellitus without complications: E11.9

## 2012-07-08 MED ORDER — DOXYCYCLINE HYCLATE 100 MG PO CAPS: 100.0000 mg | ORAL_CAPSULE | Freq: Two times a day (BID) | ORAL | Status: DC

## 2012-07-08 MED ORDER — HYDROCODONE-ACETAMINOPHEN 5-325 MG PO TABS: 1.0000 | ORAL_TABLET | ORAL | Status: DC | PRN

## 2012-07-08 MED ORDER — LIDOCAINE HCL (PF) 2 % IJ SOLN
INTRAMUSCULAR | Status: AC
  Administered 2012-07-08: 16:00:00
  Filled 2012-07-08: qty 10

## 2012-07-08 NOTE — ED Notes (Signed)
Pt reports left shoulder pain for 3 months, denies any known or recent injury. Also has abcess to left axilla for the last 4 days, area more swollen and red, painful to touch.

## 2012-07-08 NOTE — ED Notes (Signed)
Pain rt shoulder for 2-3 mos, no known injury, abscess to lt axilla for 3-4 days

## 2012-07-09 NOTE — ED Provider Notes (Signed)
History     CSN: [id number]  Arrival date & time 07/08/12  1257   First MD Initiated Contact with Patient 07/08/12 1453      Chief Complaint  Patient presents with  . Shoulder Pain  . Wound Check    (Consider location/radiation/quality/duration/timing/severity/associated sxs/prior treatment) Patient is a 42 y.o. male presenting with shoulder pain and abscess. The history is provided by the patient.  Shoulder Pain This is a chronic problem. The current episode started more than 1 month ago (Pain has been present for at least the past 2 months. He denies injury.). The problem occurs intermittently. The problem has been unchanged. Associated symptoms include arthralgias and joint swelling. Pertinent negatives include no fatigue, fever, headaches, myalgias, nausea, numbness, vomiting or weakness. Exacerbated by: Movement,  especially reaching overhead makes pain worse. He has tried acetaminophen and rest for the symptoms. The treatment provided no relief.  Abscess Location:  Shoulder/arm Shoulder/arm abscess location:  L axilla Size:  1 x 3 cm,  elongated. Abscess quality: induration, painful and warmth   Abscess quality: not draining   Red streaking: no   Duration:  4 days Progression:  Worsening Pain details:    Quality:  Throbbing and sharp   Severity:  Moderate   Timing:  Constant   Progression:  Worsening Chronicity:  New Context: diabetes   Context: not skin injury   Context comment:  His blood glucose is well controlled,  it was 115 today Relieved by:  Nothing Exacerbated by: palpation. Ineffective treatments:  Draining/squeezing and warm compresses Associated symptoms: no fatigue, no fever, no headaches, no nausea and no vomiting     Past Medical History  Diagnosis Date  . Hypertension   . Acid reflux   . Gout   . Poor circulation   . Acid reflux   . Tremors of nervous system   . Diabetes mellitus without complication     Past Surgical History  Procedure  Laterality Date  . No past surgeries      Family History  Problem Relation Age of Onset  . Hypertension Mother   . Arthritis      History  Substance Use Topics  . Smoking status: Current Every Day Smoker -- 0.25 packs/day  . Smokeless tobacco: Not on file  . Alcohol Use: No      Review of Systems  Constitutional: Negative for fever and fatigue.  Gastrointestinal: Negative for nausea and vomiting.  Musculoskeletal: Positive for joint swelling and arthralgias. Negative for myalgias.  Skin: Positive for wound.  Neurological: Negative for weakness, numbness and headaches.    Allergies  Review of patient's allergies indicates no known allergies.  Home Medications   Current Outpatient Rx  Name  Route  Sig  Dispense  Refill  . allopurinol (ZYLOPRIM) 300 MG tablet   Oral   Take 300 mg by mouth daily.           . colchicine 0.6 MG tablet   Oral   Take 0.6 mg by mouth daily.           [name] glipiZIDE (GLUCOTROL) 10 MG tablet   Oral   Take 10 mg by mouth daily.         . indomethacin (INDOCIN) 25 MG capsule   Oral   Take 25 mg by mouth 2 (two) times daily with a meal.           . lisinopril-hydrochlorothiazide (PRINZIDE,ZESTORETIC) 10-12.5 MG per tablet   Oral   Take 1 tablet by  mouth daily.         . metFORMIN (GLUCOPHAGE) 500 MG tablet   Oral   Take 500 mg by mouth 2 (two) times daily with a meal.         . omeprazole (PRILOSEC) 20 MG capsule   Oral   Take 20 mg by mouth 2 (two) times daily.          [name] doxycycline (VIBRAMYCIN) 100 MG capsule   Oral   Take 1 capsule (100 mg total) by mouth 2 (two) times daily.   20 capsule   0   . HYDROcodone-acetaminophen (NORCO/VICODIN) 5-325 MG per tablet   Oral   Take 1 tablet by mouth every 4 (four) hours as needed for pain.   15 tablet   0     BP 112/87  Pulse 84  Temp(Src) 99.3 F (37.4 C) (Oral)  Resp 20  Ht 6\' 4"  (1.93 m)  Wt 290 lb (131.543 kg)  BMI 35.31 kg/m2  SpO2 99%  Physical Exam   Constitutional: He appears well-developed and well-nourished.  HENT:  Head: Atraumatic.  Neck: Normal range of motion.  Cardiovascular:  Pulses equal bilaterally  Musculoskeletal: He exhibits tenderness. He exhibits no edema.       Right shoulder: He exhibits bony tenderness. He exhibits no swelling, no effusion, no crepitus, no deformity, no pain, no spasm, normal pulse and normal strength.  TTP along left anterior humeral head.  Pain triggered with resisted abduction.  Neurological: He is alert. He has normal strength. He displays normal reflexes. No sensory deficit.  Equal strength  Skin: Skin is warm and dry.  1 cm x 3 cm elongated indurated abscess,  Non draining.  Psychiatric: He has a normal mood and affect.    ED Course  Procedures (including critical care time)  INCISION AND DRAINAGE Performed by: Burgess Amor Consent: Verbal consent obtained. Risks and benefits: risks, benefits and alternatives were discussed Type: abscess  Body area: left axilla  Anesthesia: local infiltration  Incision was made with a scalpel.  Local anesthetic: lidocaine 2% without epinephrine  Anesthetic total: 2 ml  Complexity: simple Blunt dissection to break up loculations  Drainage: purulent  Drainage amount: small  Packing material: 1/4 in iodoform gauze  Patient tolerance: Patient tolerated the procedure well with no immediate complications.     Labs Reviewed  CULTURE, ROUTINE-ABSCESS   Dg Shoulder Right  07/08/2012  *RADIOLOGY REPORT*  Clinical Data: Pain  RIGHT SHOULDER - 2+ VIEW  Comparison: None.  Findings: The patient achieves good internal and external rotation. No degenerative change of the glenohumeral joint.  Normal humeral acromial distance.  Normal appearing AC joint.  Regional ribs appear normal.  IMPRESSION: Normal radiographs   Original Report Authenticated By: Paulina Fusi, M.D.      1. Abscess of axilla, left   2. Shoulder pain, acute, right       MDM   Patients labs and/or radiological studies were viewed and considered during the medical decision making and disposition process.   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  Suspect possible rotator strain/tendonitis.  Placed on hydrocodone.  Nsaids contraindicated with other meds.  Encouraged heat therapy,  Referral to pcp prn.  Left axillary abscess, simple with I & D.  Pt is comfortable removing the packing in 2 days.  Encouraged to start warm compress or shower soaks today.  Prescribed doxycline.  F/u for recheck with pcp or return here for any worsened sx.        Burgess Amor, PA-C 07/09/12 2152

## 2012-07-10 NOTE — ED Provider Notes (Signed)
Medical screening examination/treatment/procedure(s) were performed by non-physician practitioner and as supervising physician I was immediately available for consultation/collaboration. Derreon Consalvo, MD, FACEP   Amantha Sklar L Naveh Rickles, MD 07/10/12 0711 

## 2012-07-11 LAB — CULTURE, ROUTINE-ABSCESS: Culture: NO GROWTH

## 2012-08-27 ENCOUNTER — Encounter (HOSPITAL_COMMUNITY): Payer: Self-pay

## 2012-08-27 ENCOUNTER — Emergency Department (HOSPITAL_COMMUNITY)
Admission: EM | Admit: 2012-08-27 | Discharge: 2012-08-27 | Disposition: A | Discharge status: Home / Self Care | Payer: Medicaid Other | Attending: Emergency Medicine | Admitting: Emergency Medicine

## 2012-08-27 DIAGNOSIS — Z8739 Personal history of other diseases of the musculoskeletal system and connective tissue: Secondary | ICD-10-CM | POA: Insufficient documentation

## 2012-08-27 DIAGNOSIS — E119 Type 2 diabetes mellitus without complications: Secondary | ICD-10-CM | POA: Insufficient documentation

## 2012-08-27 DIAGNOSIS — I10 Essential (primary) hypertension: Secondary | ICD-10-CM | POA: Insufficient documentation

## 2012-08-27 DIAGNOSIS — Z202 Contact with and (suspected) exposure to infections with a predominantly sexual mode of transmission: Secondary | ICD-10-CM | POA: Insufficient documentation

## 2012-08-27 DIAGNOSIS — Z5189 Encounter for other specified aftercare: Secondary | ICD-10-CM | POA: Insufficient documentation

## 2012-08-27 DIAGNOSIS — F17200 Nicotine dependence, unspecified, uncomplicated: Secondary | ICD-10-CM | POA: Insufficient documentation

## 2012-08-27 DIAGNOSIS — R319 Hematuria, unspecified: Secondary | ICD-10-CM | POA: Insufficient documentation

## 2012-08-27 DIAGNOSIS — Z8679 Personal history of other diseases of the circulatory system: Secondary | ICD-10-CM | POA: Insufficient documentation

## 2012-08-27 DIAGNOSIS — Z8669 Personal history of other diseases of the nervous system and sense organs: Secondary | ICD-10-CM | POA: Insufficient documentation

## 2012-08-27 DIAGNOSIS — K219 Gastro-esophageal reflux disease without esophagitis: Secondary | ICD-10-CM | POA: Insufficient documentation

## 2012-08-27 DIAGNOSIS — Z79899 Other long term (current) drug therapy: Secondary | ICD-10-CM | POA: Insufficient documentation

## 2012-08-27 MED ORDER — METRONIDAZOLE 500 MG PO TABS: 500.0000 mg | ORAL_TABLET | Freq: Two times a day (BID) | ORAL | Status: DC

## 2012-08-27 MED ORDER — LIDOCAINE HCL (PF) 1 % IJ SOLN
INTRAMUSCULAR | Status: AC
  Filled 2012-08-27: qty 5

## 2012-08-27 MED ORDER — CEFTRIAXONE SODIUM 250 MG IJ SOLR
250.0000 mg | Freq: Once | INTRAMUSCULAR | Status: AC
  Administered 2012-08-27: 250 mg via INTRAMUSCULAR
  Filled 2012-08-27: qty 250

## 2012-08-27 MED ORDER — AZITHROMYCIN 250 MG PO TABS
1000.0000 mg | ORAL_TABLET | Freq: Once | ORAL | Status: AC
  Administered 2012-08-27: 1000 mg via ORAL
  Filled 2012-08-27: qty 4

## 2012-08-27 NOTE — ED Provider Notes (Signed)
History     This chart was scribed for Gilda Crease,, MD by Smitty Pluck, ED Scribe. The patient was seen in room APA15/APA15 and the patient's care was started at 10:00 AM.   CSN: [id number]  Arrival date & time 08/27/12  0941      No chief complaint on file.    The history is provided by the patient and medical records. No language interpreter was used.   HPI Comments: Daniel Nicholson is a 42 y.o. male who presents to the Emergency Department complaining of possible STD exposure. Pt reports that his partner was treated for trichomonas 1 day ago. He reports hematuria. Pt denies penile discharge, dysuria, lesions, fever, chills, nausea, vomiting, diarrhea, weakness, cough, SOB and any other pain.   Past Medical History  Diagnosis Date  . Hypertension   . Acid reflux   . Gout   . Poor circulation   . Acid reflux   . Tremors of nervous system   . Diabetes mellitus without complication     Past Surgical History  Procedure Laterality Date  . No past surgeries      Family History  Problem Relation Age of Onset  . Hypertension Mother   . Arthritis      History  Substance Use Topics  . Smoking status: Current Every Day Smoker -- 0.25 packs/day  . Smokeless tobacco: Not on file  . Alcohol Use: No      Review of Systems  Constitutional: Negative for fever and chills.  Genitourinary: Positive for hematuria. Negative for dysuria and discharge.  All other systems reviewed and are negative.    Allergies  Review of patient's allergies indicates no known allergies.  Home Medications   Current Outpatient Rx  Name  Route  Sig  Dispense  Refill  . allopurinol (ZYLOPRIM) 300 MG tablet   Oral   Take 300 mg by mouth daily.           . colchicine 0.6 MG tablet   Oral   Take 0.6 mg by mouth daily.           [name] doxycycline (VIBRAMYCIN) 100 MG capsule   Oral   Take 1 capsule (100 mg total) by mouth 2 (two) times daily.   20 capsule   0   .  glipiZIDE (GLUCOTROL) 10 MG tablet   Oral   Take 10 mg by mouth daily.         [name] HYDROcodone-acetaminophen (NORCO/VICODIN) 5-325 MG per tablet   Oral   Take 1 tablet by mouth every 4 (four) hours as needed for pain.   15 tablet   0   . indomethacin (INDOCIN) 25 MG capsule   Oral   Take 25 mg by mouth 2 (two) times daily with a meal.           . lisinopril-hydrochlorothiazide (PRINZIDE,ZESTORETIC) 10-12.5 MG per tablet   Oral   Take 1 tablet by mouth daily.         . metFORMIN (GLUCOPHAGE) 500 MG tablet   Oral   Take 500 mg by mouth 2 (two) times daily with a meal.         . omeprazole (PRILOSEC) 20 MG capsule   Oral   Take 20 mg by mouth 2 (two) times daily.            BP 144/102  Pulse 83  Temp(Src) 97.9 F (36.6 C) (Oral)  Resp 17  SpO2 100%  Physical Exam  Nursing note  and vitals reviewed. Constitutional: He is oriented to person, place, and time. He appears well-developed and well-nourished. No distress.  HENT:  Head: Normocephalic and atraumatic.  Right Ear: Hearing normal.  Left Ear: Hearing normal.  Nose: Nose normal.  Mouth/Throat: Oropharynx is clear and moist and mucous membranes are normal.  Eyes: Conjunctivae and EOM are normal. Pupils are equal, round, and reactive to light.  Neck: Normal range of motion. Neck supple.  Cardiovascular: Regular rhythm, S1 normal and S2 normal.  Exam reveals no gallop and no friction rub.   No murmur heard. Pulmonary/Chest: Effort normal and breath sounds normal. No respiratory distress. He exhibits no tenderness.  Abdominal: Soft. Normal appearance and bowel sounds are normal. There is no hepatosplenomegaly. There is no tenderness. There is no rebound, no guarding, no tenderness at McBurney's point and negative Murphy's sign. No hernia.  Musculoskeletal: Normal range of motion.  Neurological: He is alert and oriented to person, place, and time. He has normal strength. No cranial nerve deficit or sensory deficit.  Coordination normal. GCS eye subscore is 4. GCS verbal subscore is 5. GCS motor subscore is 6.  Skin: Skin is warm, dry and intact. No rash noted. No cyanosis.  Psychiatric: He has a normal mood and affect. His speech is normal and behavior is normal. Thought content normal.    ED Course  Procedures (including critical care time) DIAGNOSTIC STUDIES: Oxygen Saturation is 100% on room air, normal by my interpretation.    COORDINATION OF CARE: 10:02 AM Discussed ED treatment with pt and pt agrees.     Labs Reviewed - No data to display No results found.   Diagnosis: Trichomonas exposure    MDM  Patient presents with positive Trichomonas exposure. He is asymptomatic. Patient to be treated empirically for STD including Rocephin, Zithromax and a one-week course of Flagyl.  I personally performed the services described in this documentation, which was scribed in my presence. The recorded information has been reviewed and is accurate.      Gilda Crease, MD 08/27/12 1009

## 2012-08-27 NOTE — ED Notes (Signed)
Pt reports his sexual partner currently being treated for trichomonas and he would like to be tested for it.  Pt reports he would like to be checked for all STD's.  Pt denies any symptoms.

## 2012-11-12 ENCOUNTER — Telehealth: Payer: Self-pay

## 2012-11-12 NOTE — Telephone Encounter (Signed)
T/C from Tiffany at Dr[facility] Letitia Neri checking on status of pt's referral for colonoscopy. Said she sent it about 3 weeks ago. I checked and I have not received a referral for him. I asked was it just for screening and she said he has rectal bleed and I told her he would need OV first. She said she will fax over the referral.

## 2012-11-12 NOTE — Telephone Encounter (Signed)
Referral received and pt was scheduled appt by [facility] for OV with AS on 12/05/2012 at 2:00 PM.

## 2012-11-21 ENCOUNTER — Encounter (HOSPITAL_COMMUNITY): Payer: Self-pay | Admitting: Emergency Medicine

## 2012-11-21 ENCOUNTER — Emergency Department (HOSPITAL_COMMUNITY)
Admission: EM | Admit: 2012-11-21 | Discharge: 2012-11-21 | Disposition: A | Discharge status: Home / Self Care | Payer: Medicaid Other | Attending: Emergency Medicine | Admitting: Emergency Medicine

## 2012-11-21 DIAGNOSIS — M109 Gout, unspecified: Secondary | ICD-10-CM | POA: Insufficient documentation

## 2012-11-21 DIAGNOSIS — R300 Dysuria: Secondary | ICD-10-CM | POA: Insufficient documentation

## 2012-11-21 DIAGNOSIS — Z79899 Other long term (current) drug therapy: Secondary | ICD-10-CM | POA: Insufficient documentation

## 2012-11-21 DIAGNOSIS — E119 Type 2 diabetes mellitus without complications: Secondary | ICD-10-CM | POA: Insufficient documentation

## 2012-11-21 DIAGNOSIS — R197 Diarrhea, unspecified: Secondary | ICD-10-CM | POA: Insufficient documentation

## 2012-11-21 DIAGNOSIS — K921 Melena: Secondary | ICD-10-CM | POA: Insufficient documentation

## 2012-11-21 DIAGNOSIS — K219 Gastro-esophageal reflux disease without esophagitis: Secondary | ICD-10-CM | POA: Insufficient documentation

## 2012-11-21 DIAGNOSIS — F17200 Nicotine dependence, unspecified, uncomplicated: Secondary | ICD-10-CM | POA: Insufficient documentation

## 2012-11-21 DIAGNOSIS — R319 Hematuria, unspecified: Secondary | ICD-10-CM | POA: Insufficient documentation

## 2012-11-21 DIAGNOSIS — K649 Unspecified hemorrhoids: Secondary | ICD-10-CM | POA: Insufficient documentation

## 2012-11-21 DIAGNOSIS — K625 Hemorrhage of anus and rectum: Secondary | ICD-10-CM | POA: Insufficient documentation

## 2012-11-21 DIAGNOSIS — Z8679 Personal history of other diseases of the circulatory system: Secondary | ICD-10-CM | POA: Insufficient documentation

## 2012-11-21 DIAGNOSIS — I10 Essential (primary) hypertension: Secondary | ICD-10-CM | POA: Insufficient documentation

## 2012-11-21 DIAGNOSIS — R109 Unspecified abdominal pain: Secondary | ICD-10-CM | POA: Insufficient documentation

## 2012-11-21 LAB — URINALYSIS, ROUTINE W REFLEX MICROSCOPIC
Leukocytes, UA: NEGATIVE
Nitrite: NEGATIVE
Specific Gravity, Urine: 1.02 (ref 1.005–1.030)
Urobilinogen, UA: 0.2 mg/dL (ref 0.0–1.0)
pH: 6 (ref 5.0–8.0)

## 2012-11-21 LAB — OCCULT BLOOD, POC DEVICE: Fecal Occult Bld: NEGATIVE

## 2012-11-21 NOTE — ED Notes (Signed)
Pt c/o rectal bleeding x1-2 months as well as hematuria x2 days. Pt reports mild left side pain.

## 2012-11-21 NOTE — ED Notes (Signed)
Patient reports rectal bleeding x2 months, had an appointment at the end of this month but started having left flank pain with blood in urine 2 days ago. C/o some pain with urination. Patient reports blood as bright red blood in stool and urine. Patient reports diarrhea but denies any fevers, nausea, or vomiting.

## 2012-11-21 NOTE — ED Provider Notes (Signed)
CSN: [id number]     Arrival date & time 11/21/12  1351 History    This chart was scribed for Joya Gaskins, MD,  by Ashley Jacobs, ED Scribe. The patient was seen in room APA09/APA09 and the patient's care was started at 3:38 PM     Chief Complaint  Patient presents with  . Rectal Bleeding  . Flank Pain  . Hematuria    Patient is a 42 y.o. male presenting with hematochezia. The history is provided by the patient and medical records. No language interpreter was used.  Rectal Bleeding Quality:  Bright red Amount:  Scant Duration:  2 months Timing:  Constant Progression:  Unchanged Chronicity:  New Context: diarrhea and hemorrhoids   Similar prior episodes: yes   Relieved by:  Nothing Worsened by:  Wiping Ineffective treatments:  None tried Associated symptoms: no abdominal pain, no fever and no vomiting    HPI Comments: Daniel Nicholson is a 42 y.o. male who presents to the Emergency Department complaining of rectal bleeding, sharp, non radiating, L flank pain, and hematuria that present 2 days ago. Pt reports the associated symptoms of dysuria but denies penile discharge and no recent STD exposures  Pt reports bright red stool in stool and urine that is present when wiping. Pt denies abd pain, weakness, vomiting and fever. Pt mentions that was dx with hemorroids two months ago and is now scheduled for a colonoscopy at the end of the month  Past Medical History  Diagnosis Date  . Hypertension   . Acid reflux   . Gout   . Poor circulation   . Acid reflux   . Tremors of nervous system   . Diabetes mellitus without complication    Past Surgical History  Procedure Laterality Date  . No past surgeries     Family History  Problem Relation Age of Onset  . Hypertension Mother   . Arthritis     History  Substance Use Topics  . Smoking status: Current Every Day Smoker -- 0.25 packs/day for 30 years    Types: Cigarettes  . Smokeless tobacco: Never Used  . Alcohol  Use: No    Review of Systems  Constitutional: Negative for fever.  Gastrointestinal: Positive for diarrhea and hematochezia. Negative for nausea, vomiting, abdominal pain and constipation.       Rectal bleeding   Genitourinary: Positive for hematuria.  Musculoskeletal:       Flank pain  Neurological: Negative for weakness.    Allergies  Review of patient's allergies indicates no known allergies.  Home Medications   Current Outpatient Rx  Name  Route  Sig  Dispense  Refill  . allopurinol (ZYLOPRIM) 300 MG tablet   Oral   Take 300 mg by mouth every morning.          . colchicine 0.6 MG tablet   Oral   Take 0.6 mg by mouth every morning.          . indomethacin (INDOCIN) 25 MG capsule   Oral   Take 25 mg by mouth 2 (two) times daily with a meal.           . lisinopril-hydrochlorothiazide (PRINZIDE,ZESTORETIC) 10-12.5 MG per tablet   Oral   Take 1 tablet by mouth every morning.          . metFORMIN (GLUCOPHAGE) 500 MG tablet   Oral   Take 500 mg by mouth 2 (two) times daily with a meal.         .  omeprazole (PRILOSEC) 20 MG capsule   Oral   Take 20 mg by mouth 2 (two) times daily.           BP 110/62  Pulse 84  Temp(Src) 98.4 F (36.9 C) (Oral)  Resp 18  Ht 6\' 4"  (1.93 m)  Wt 295 lb (133.811 kg)  BMI 35.92 kg/m2  SpO2 100% Physical Exam CONSTITUTIONAL: Well developed/well nourished HEAD: Normocephalic/atraumatic EYES: EOMI/PERRL, conjunctiva pink  ENMT: Mucous membranes moist NECK: supple no meningeal signs SPINE:entire spine nontender CV: S1/S2 noted, no murmurs/rubs/gallops noted LUNGS: Lungs are clear to auscultation bilaterally, no apparent distress ABDOMEN: soft, nontender, no rebound or guarding GU:no cva tenderness, no testicular tenderness , no penile discharge, chaperone present Rectal: no hemorrhoids, brown stool noted, no melena or blood noted Chaperone present NEURO: Pt is awake/alert, moves all extremitiesx4 EXTREMITIES: pulses  normal, full ROM SKIN: warm, color normal PSYCH: no abnormalities of mood noted  ED Course  DIAGNOSTIC STUDIES: Oxygen Saturation is 100% on room air, normal by my interpretation.    COORDINATION OF CARE: 3:41 PM Discussed course of care with pt . Pt understands and agrees. Pt stable.  No signs of GI bleed.  No external hemorrhoids noted U/a negative and no abd/flank tenderness on my evaluation I do not feel further workup necessary as he is otherwise well appearing/nontoxic.  We discussed strict return precautions Stable for d/c Procedures   MDM  Nursing notes including past medical history and social history reviewed and considered in documentation Labs/vital reviewed and considered    I personally performed the services described in this documentation, which was scribed in my presence. The recorded information has been reviewed and is accurate.    Joya Gaskins, MD 11/21/12 [phone]

## 2012-12-05 ENCOUNTER — Ambulatory Visit (INDEPENDENT_AMBULATORY_CARE_PROVIDER_SITE_OTHER): Payer: Medicaid Other | Admitting: Gastroenterology

## 2012-12-05 ENCOUNTER — Encounter: Payer: Self-pay | Admitting: Gastroenterology

## 2012-12-05 ENCOUNTER — Other Ambulatory Visit: Payer: Self-pay | Admitting: Internal Medicine

## 2012-12-05 ENCOUNTER — Other Ambulatory Visit: Payer: Self-pay | Admitting: Gastroenterology

## 2012-12-05 VITALS — BP 125/80 | HR 100 | Temp 98.2°F | Ht 72.0 in | Wt 297.8 lb

## 2012-12-05 DIAGNOSIS — K625 Hemorrhage of anus and rectum: Secondary | ICD-10-CM

## 2012-12-05 DIAGNOSIS — R310 Gross hematuria: Secondary | ICD-10-CM

## 2012-12-05 DIAGNOSIS — K760 Fatty (change of) liver, not elsewhere classified: Secondary | ICD-10-CM | POA: Insufficient documentation

## 2012-12-05 DIAGNOSIS — K7689 Other specified diseases of liver: Secondary | ICD-10-CM

## 2012-12-05 MED ORDER — PEG 3350-KCL-NA BICARB-NACL 420 G PO SOLR: 4000.0000 mL | ORAL | Status: DC

## 2012-12-05 MED ORDER — HYDROCORTISONE ACETATE 25 MG RE SUPP: 25.0000 mg | Freq: Two times a day (BID) | RECTAL | Status: DC

## 2012-12-05 NOTE — Patient Instructions (Addendum)
Please have blood work completed. We will call with the results.  Instructions for fatty liver: Recommend 1-2# weight loss per week until ideal body weight through exercise & diet. Low fat/cholesterol diet.   Avoid sweets, sodas, fruit juices, sweetened beverages like tea, etc. Gradually increase exercise from 15 min daily up to 1 hr per day 5 days/week. Limit alcohol use.  We have set you up for a colonoscopy with Dr. Jena Gauss in the near future. I have also sent a prescription for suppositories to your pharmacy. Use this twice a day for 5 days. Avoid constipation and straining. Please review the high fiber diet and follow this.   We have also sent you to a urologist due to your urinary symptoms.   How to Increase Fiber in the Meal Plan for Diabetes Increasing fiber in the diet has many benefits including lowering blood cholesterol, helping to control blood glucose (sugar), preventing constipation, and aiding in weight management by helping you feel full longer. Start adding fiber to your diet slowly. A gradual substitution of high fiber foods for low fiber foods will allow the digestive tract to adjust. Most men under 29 years of age should aim to eat 38 g of fiber a day. Women should aim for 25 g. Over 27 years of age, most men need 30 g of fiber and most women need 21 g. Below are some suggestions for increasing fiber.  Try whole-wheat bread instead of white bread. Look for words high on the list of ingredients such as whole wheat, whole rye, or whole oats.  Try baked potato with skin instead of mashed potatoes.  Try a fresh apple with skin instead of applesauce.  Try a fresh orange instead of orange juice.  Try popcorn instead of potato chips.  Try bran cereal instead of corn flakes.  Try kidney, whole pinto, or garbanzo beans instead of bread.  Try whole-grain crackers instead of saltine crackers.  Try whole-wheat pasta instead of regular varieties.  While on a high fiber diet,  drink enough water and fluids to keep your urine clear or pale yellow.  Eat a variety of high fiber foods such as fruits, vegetables, whole grains, nuts, and seeds.  Try to increase your intake of fiber by eating high fiber foods instead of taking fiber pills or supplements that contain small amounts of fiber. There can be additional benefits for long-term health and blood glucose control with high fiber foods. Aim for 5 servings of fruit and vegetables per day. SOURCES OF FIBER The following list shows the average dietary fiber for types of food in the various food groups. Starch and Bread / Dietary Fiber (g)  Whole-grain breads, 1 slice / 2 g  Whole grain,  cup / 2 g  Whole-grain cereals,  cup / 3 g  Bran cereals,  to  cup / 8 g  Starchy vegetables,  cup / 3 g  Legumes (beans, peas, lentils),  cup / 8 g  Oatmeal,  cup / 2 g  Whole-wheat pasta,  cup / 2 g  Brown rice,  cup / 2 g  Barley,  cup / 3 g Meat and Meat Substitutes / Dietary Fiber (g) This group averages 0 grams of fiber. Exceptions are:  Nuts, seeds, 1 oz or  cup / 3 g  Chunky peanut butter, 2 tbs / 3 g Vegetables / Dietary Fiber (g)  Cooked vegetables,  to  cup / 2 to 3 g  Raw vegetables, 1 to 2 cups /  2 to 3 g Fruit / Dietary Fiber (g)  Raw or cooked fruit,  cup or 1 small, fresh piece / 2 g Milk / Dietary Fiber (g)  Milk, 1 cup or 8 oz / 0 g Fats and Oils / Dietary Fiber (g)  Fats and oils, 1 tsp / 0 g You can determine how much fiber you are eating by reading the Nutrition Facts panel on the labels of the foods you eat. FIBER IN SPECIFIC FOODS Cereals / Dietary Fiber (g)  All Bran,  cup / 9 g  All Bran with Extra Fiber,  cup / 13 g  Bran Flakes,  cup / 4 g  Cheerios,  cup / 1.5 g  Corn Bran,  cup / 4 g  Corn Flakes,  cup / 0.75 g  Cracklin' Oat Bran,  cup / 4 g  Fiber One,  cup / 13 g  Grape Nuts, 3 tbs / 3 g  Grape Nuts Flakes,  cup / 3 g  Noodles,   cup, cooked / 0.5 g  Nutrigrain Wheat,  cup / 3.5 g  Oatmeal,  cup, cooked / 1.1 g  Pasta, white (macaroni, spaghetti),  cup, cooked / 0.5 g  Pasta, whole-wheat (macaroni, spaghetti),  cup, cooked / 2 g  [place],  cup, cooked / 3 g  Rice, wild,  cup, cooked / 0.5 g  Rice, brown,  cup, cooked / 1 g  Rice, white,  cup, cooked / 0.2 g  Shredded Wheat, bite-sized,  cup / 2 g  Total,  cup / 1.75 g  Wheat Chex,  cup / 2.5 g  Wheatena,  cup, cooked / 4 g  Wheaties,  cup / 2.75 g Bread, Starchy Vegetables, and Dried Peas and Beans / Dietary Fiber (g)  Bagel, whole / 0.6 g  Baked beans in tomato sauce,  cup, cooked / 3 g  Bran muffin, 1 small / 2.5 g  Bread, cracked wheat, 1 slice / 2.5 g  Bread, pumpernickel, 1 slice / 2.5 g  Bread, white, 1 slice / 0.4 g  Bread, whole-wheat, 1 slice / 1.4 g  Corn,  cup, canned / 2.9 g  Kidney beans,  cup, cooked / 3.5 g  Lentils, cup, cooked / 3 g  Lima beans,  cup, cooked / 4 g  Navy beans,  cup, cooked / 4 g  Peas,  cup, cooked / 4 g  Popcorn, 3 cups popped, unbuttered / 3.5 g  Potato, baked (with skin), 1 small / 4 g  Potato, baked (without skin), 1 small / 2 g  Ry-Krisp, 4 crackers / 3 g  Saltine crackers, 6 squares / 0 g  Split peas,  cup, cooked / 2.5 g  Yams (sweet potato),  cup / 1.7 g Fruit / Dietary Fiber (g)  Apple, 1 small, fresh, with skin / 4 g  Apple juice,  cup / 0.4 g  Apricots, 4 medium, fresh / 4 g  Apricots, 7 halves, dried / 2 g  Banana,  medium / 1.2 g  Blueberries,  cup / 2 g  Cantaloupe, melon / 1.3 g  Cherries,  cup, canned / 1.4 g  Grapefruit,  medium / 1.6 g  Grapes, 15 small / 1.2 g  Grape juice,  cup / 0.5 g  Orange, 1 medium, fresh / 2 g  Orange juice,  cup / 0.5 g  Peach, 1 medium,fresh, with skin / 2 g  Pear, 1 medium, fresh, with skin / 4 g  Pineapple, cup, canned / 0.7 g  Plums, 2 whole / 2 g  Prunes, 3 whole / 1.5  g  Raspberries, 1 cup / 6 g  Strawberries, 1  cup / 4 g  Watermelon, 1  cup / 0.5 g Vegetables / Dietary Fiber (g)  Asparagus,  cup, cooked / 1 g  Beans, green and wax,  cup, cooked / 1.6 g  Beets,  cup, cooked / 1.8 g  Broccoli,  cup, cooked / 2.2 g  Brussels sprouts,  cup, cooked / 4 g  Cabbage,  cup, cooked / 2.5 g  Carrots,  cup, cooked / 2.3 g  Cauliflower,  cup, cooked / 1.1 g  Celery, 1 cup, raw / 1.5 g  Cucumber, 1 cup, raw / 0.8 g  Green pepper,  cup sliced, cooked / 1.5 g  Lettuce, 1 cup, sliced / 0.9 g  Mushrooms, 1 cup sliced, raw / 1.8 g  Onion, 1 cup sliced, raw / 1.6 g  Spinach,  cup, cooked / 2.4 g  Tomato, 1 medium, fresh / 1.5 g  Tomato juice,  cup / 0 g  Zucchini,  cup, cooked / 1.8 g Document Released: 09/16/2001 Document Revised: 06/19/2011 Document Reviewed: 10/13/2008 [employer] Patient Information 2014 [place], [place].

## 2012-12-05 NOTE — Assessment & Plan Note (Signed)
Per patient. Last UA in ED WNL. Notes burning with urination. Refer to urology due to persistent symptoms.

## 2012-12-05 NOTE — Progress Notes (Signed)
Referring Provider: Avon Gully, MD Primary Care Physician:  Avon Gully, MD Primary Gastroenterologist:  Dr. Jena Gauss   Chief Complaint  Patient presents with  . Rectal Bleeding    HPI:   Daniel Nicholson is a 42 year old male presenting today at the request of Dr. Felecia Shelling secondary to rectal bleeding. Onset a few months ago. Tissue hematochezia. Rectal discomfort, itching. Lower abdominal vague discomfort "like I gotta go to bathroom but don't". No changes in bowel habits. Occasional straining. No weight loss or lack of appetite. Sometimes gags at night. No dysphagia. Prilosec BID. Eats around 7 or 8 pm.    Past Medical History  Diagnosis Date  . Hypertension   . Acid reflux   . Gout   . Poor circulation   . Acid reflux   . Tremors of nervous system   . Diabetes mellitus without complication     Past Surgical History  Procedure Laterality Date  . No past surgeries      Current Outpatient Prescriptions  Medication Sig Dispense Refill  . allopurinol (ZYLOPRIM) 300 MG tablet Take 300 mg by mouth every morning.       . colchicine 0.6 MG tablet Take 0.6 mg by mouth every morning.       . indomethacin (INDOCIN) 25 MG capsule Take 25 mg by mouth 2 (two) times daily with a meal.        . lisinopril-hydrochlorothiazide (PRINZIDE,ZESTORETIC) 10-12.5 MG per tablet Take 1 tablet by mouth every morning.       . metFORMIN (GLUCOPHAGE) 500 MG tablet Take 500 mg by mouth 2 (two) times daily with a meal.      . omeprazole (PRILOSEC) 20 MG capsule Take 20 mg by mouth 2 (two) times daily.        No current facility-administered medications for this visit.    Allergies as of 12/05/2012  . (No Known Allergies)    Family History  Problem Relation Age of Onset  . Hypertension Mother   . Arthritis      History   Social History  . Marital Status: Divorced    Spouse Name: N/A    Number of Children: N/A  . Years of Education: 12th grade   Occupational History  . ?    Social History  Main Topics  . Smoking status: Current Every Day Smoker -- 0.25 packs/day for 30 years    Types: Cigarettes  . Smokeless tobacco: Never Used  . Alcohol Use: No  . Drug Use: No  . Sexual Activity: Yes    Birth Control/ Protection: Condom   Other Topics Concern  . Not on file   Social History Narrative  . No narrative on file    Review of Systems: Gen: Denies any fever, chills, loss of appetite, fatigue, weight loss. CV: Denies chest pain, heart palpitations, syncope, peripheral edema. Resp: +cough GI: see HPI GU : +urinary burning, sometimes hematuria  MS: Denies joint pain, muscle weakness, cramps, limited movement Derm: Denies rash, itching, dry skin Psych: Denies depression, anxiety, confusion or memory loss  Heme: Denies bruising, bleeding, and enlarged lymph nodes.  Physical Exam: BP 125/80  Pulse 100  Temp(Src) 98.2 F (36.8 C) (Oral)  Ht 6' (1.829 m)  Wt 297 lb 12.8 oz (135.081 kg)  BMI 40.38 kg/m2 General:   Alert and oriented. Well-developed, well-nourished, pleasant and cooperative. Head:  Normocephalic and atraumatic. Eyes:  Conjunctiva pink, sclera clear, no icterus.   Conjunctiva pink. Ears:  Normal auditory acuity. Nose:  No deformity, discharge,  or lesions. Mouth:  No deformity or lesions, mucosa pink and moist.  Neck:  Supple, without mass or thyromegaly. Lungs:  Clear to auscultation bilaterally, without wheezing, rales, or rhonchi.  Heart:  S1, S2 present without murmurs noted.  Abdomen:  +BS, soft, non-tender and non-distended. Without mass or HSM. No rebound or guarding. No hernias noted. Rectal:  External exam without abnormalities, no mass or stricture appreciated Msk:  Symmetrical without gross deformities. Normal posture. Extremities:  Without clubbing or edema. Neurologic:  Alert and  oriented x4;  grossly normal neurologically. Skin:  Intact, warm and dry without significant lesions or rashes Cervical Nodes:  No significant cervical  adenopathy. Psych:  Alert and cooperative. Normal mood and affect.

## 2012-12-05 NOTE — Assessment & Plan Note (Signed)
Noted on CT scan in past. Check HFP today for baseline. Instructions for fatty liver provided.

## 2012-12-05 NOTE — Assessment & Plan Note (Addendum)
New-onset, associated with rectal discomfort and itching. Query hemorrhoids or anal fissure, likely benign anorectal source. Low likelihood of malignancy. Needs initial screening colonoscopy in the near future. In the interim, we will provide anusol suppositories BID, high fiber diet.   Proceed with TCS with Dr. Jena Gauss in near future: the risks, benefits, and alternatives have been discussed with the patient in detail. The patient states understanding and desires to proceed. CBC now

## 2012-12-06 ENCOUNTER — Encounter (HOSPITAL_COMMUNITY): Payer: Self-pay | Admitting: Pharmacy Technician

## 2012-12-06 NOTE — Progress Notes (Signed)
CC'd to PCP 

## 2012-12-10 LAB — HEPATIC FUNCTION PANEL
AST: 27 U/L (ref 0–37)
Albumin: 4.5 g/dL (ref 3.5–5.2)
Total Bilirubin: 0.5 mg/dL (ref 0.3–1.2)

## 2012-12-11 LAB — CBC WITH DIFFERENTIAL/PLATELET
Basophils Absolute: 0 10*3/uL (ref 0.0–0.1)
HCT: 44.3 % (ref 39.0–52.0)
Lymphocytes Relative: 47 % — ABNORMAL HIGH (ref 12–46)
Lymphs Abs: 2.5 10*3/uL (ref 0.7–4.0)
Monocytes Absolute: 0.3 10*3/uL (ref 0.1–1.0)
Neutro Abs: 1.9 10*3/uL (ref 1.7–7.7)
RBC: 5.2 MIL/uL (ref 4.22–5.81)
RDW: 15 % (ref 11.5–15.5)
WBC: 5.4 10*3/uL (ref 4.0–10.5)

## 2012-12-13 NOTE — Progress Notes (Signed)
Quick Note:  LFTs WNL. Repeat in 1 year. Dx: fatty liver No anemia on CBC. Proceed with procedures as planned.   ______

## 2012-12-16 ENCOUNTER — Encounter (HOSPITAL_COMMUNITY): Payer: Self-pay | Admitting: *Deleted

## 2012-12-16 ENCOUNTER — Encounter (HOSPITAL_COMMUNITY)
Admission: RE | Disposition: A | Discharge status: Home / Self Care | Payer: Self-pay | Source: Ambulatory Visit | Attending: Internal Medicine

## 2012-12-16 ENCOUNTER — Ambulatory Visit (HOSPITAL_COMMUNITY)
Admission: RE | Admit: 2012-12-16 | Discharge: 2012-12-16 | Disposition: A | Discharge status: Home / Self Care | Payer: Medicaid Other | Source: Ambulatory Visit | Attending: Internal Medicine | Admitting: Internal Medicine

## 2012-12-16 ENCOUNTER — Telehealth: Payer: Self-pay

## 2012-12-16 DIAGNOSIS — K648 Other hemorrhoids: Secondary | ICD-10-CM | POA: Insufficient documentation

## 2012-12-16 DIAGNOSIS — E119 Type 2 diabetes mellitus without complications: Secondary | ICD-10-CM | POA: Insufficient documentation

## 2012-12-16 DIAGNOSIS — K921 Melena: Secondary | ICD-10-CM

## 2012-12-16 DIAGNOSIS — Z01812 Encounter for preprocedural laboratory examination: Secondary | ICD-10-CM | POA: Insufficient documentation

## 2012-12-16 DIAGNOSIS — K625 Hemorrhage of anus and rectum: Secondary | ICD-10-CM

## 2012-12-16 DIAGNOSIS — I10 Essential (primary) hypertension: Secondary | ICD-10-CM | POA: Insufficient documentation

## 2012-12-16 HISTORY — PX: COLONOSCOPY: SHX5424

## 2012-12-16 LAB — GLUCOSE, CAPILLARY: Glucose-Capillary: 127 mg/dL — ABNORMAL HIGH (ref 70–99)

## 2012-12-16 SURGERY — COLONOSCOPY
Anesthesia: Moderate Sedation

## 2012-12-16 MED ORDER — MIDAZOLAM HCL 5 MG/5ML IJ SOLN
INTRAMUSCULAR | Status: DC | PRN
  Administered 2012-12-16: 2 mg via INTRAVENOUS
  Administered 2012-12-16 (×2): 1 mg via INTRAVENOUS

## 2012-12-16 MED ORDER — STERILE WATER FOR IRRIGATION IR SOLN
Status: DC | PRN
  Administered 2012-12-16: 11:00:00

## 2012-12-16 MED ORDER — ONDANSETRON HCL 4 MG/2ML IJ SOLN
INTRAMUSCULAR | Status: DC | PRN
  Administered 2012-12-16: 4 mg via INTRAVENOUS

## 2012-12-16 MED ORDER — MIDAZOLAM HCL 5 MG/5ML IJ SOLN
INTRAMUSCULAR | Status: AC
  Filled 2012-12-16: qty 10

## 2012-12-16 MED ORDER — MEPERIDINE HCL 100 MG/ML IJ SOLN
INTRAMUSCULAR | Status: AC
  Filled 2012-12-16: qty 1

## 2012-12-16 MED ORDER — ONDANSETRON HCL 4 MG/2ML IJ SOLN
INTRAMUSCULAR | Status: AC
  Filled 2012-12-16: qty 2

## 2012-12-16 MED ORDER — MEPERIDINE HCL 100 MG/ML IJ SOLN
INTRAMUSCULAR | Status: DC | PRN
  Administered 2012-12-16: 25 mg via INTRAVENOUS
  Administered 2012-12-16: 50 mg via INTRAVENOUS
  Administered 2012-12-16: 25 mg via INTRAVENOUS

## 2012-12-16 MED ORDER — SODIUM CHLORIDE 0.9 % IV SOLN
INTRAVENOUS | Status: DC
  Administered 2012-12-16: 11:00:00 via INTRAVENOUS

## 2012-12-16 NOTE — Op Note (Signed)
[facility] [address] [place], [address]   COLONOSCOPY PROCEDURE REPORT  PATIENT: Daniel Nicholson, Daniel Nicholson  MR#:         [id number] BIRTHDATE: 1970/05/19 , 42  yrs. old GENDER: Male ENDOSCOPIST: R.  Roetta Sessions, MD FACP FACG REFERRED BY:  Glenice Laine, M.D. PROCEDURE DATE:  12/16/2012 PROCEDURE:     diagnostic colonoscopy  INDICATIONS: hematochezia  INFORMED CONSENT:  The risks, benefits, alternatives and imponderables including but not limited to bleeding, perforation as well as the possibility of a missed lesion have been reviewed.  The potential for biopsy, lesion removal, etc. have also been discussed.  Questions have been answered.  All parties agreeable. Please see the history and physical in the medical record for more information.  MEDICATIONS: Versed 4 mg IV and Demerol 100 mg IV in divided doses. Zofran 4 mg  DESCRIPTION OF PROCEDURE:  After a digital rectal exam was performed, the EC-3890Li ([id number])  colonoscope was advanced from the anus through the rectum and colon to the area of the cecum, ileocecal valve and appendiceal orifice.  The cecum was deeply intubated.  These structures were well-seen and photographed for the record.  From the level of the cecum and ileocecal valve, the scope was slowly and cautiously withdrawn.  The mucosal surfaces were carefully surveyed utilizing scope tip deflection to facilitate fold flattening as needed.  The scope was pulled down into the rectum where a thorough examination including retroflexion was performed.    FINDINGS:  Adequate preparation. Couple of prominent hemorrhoidal piles present; otherwise,  normal rectum. Normal-appearing colonic mucosa.  THERAPEUTIC / DIAGNOSTIC MANEUVERS PERFORMED:  None  COMPLICATIONS: none  CECAL WITHDRAWAL TIME:  8 minutes  IMPRESSION:  Internal hemorrhoids -- likely source of hematochezia. Normal colonoscopy otherwise  RECOMMENDATIONS: Will offer   hemorrhoidal banding in the office in the near future. Patient to return for a screening colonoscopy in 10 years.   _______________________________ eSigned:  R. Roetta Sessions, MD FACP [facility] 12/16/2012 11:11 AM   CC:

## 2012-12-16 NOTE — Telephone Encounter (Signed)
Darl Pikes, per RMR- pt needs to be set up for a hemorrhoid banding in the office.

## 2012-12-16 NOTE — H&P (View-Only) (Signed)
Referring Provider: Fanta, Tesfaye, MD Primary Care Physician:  FANTA,TESFAYE, MD Primary Gastroenterologist:  Dr. Rourk   Chief Complaint  Patient presents with  . Rectal Bleeding    HPI:   [name] is a 42-year-old male presenting today at the request of Dr. Fanta secondary to rectal bleeding. Onset a few months ago. Tissue hematochezia. Rectal discomfort, itching. Lower abdominal vague discomfort "like I gotta go to bathroom but don't". No changes in bowel habits. Occasional straining. No weight loss or lack of appetite. Sometimes gags at night. No dysphagia. Prilosec BID. Eats around 7 or 8 pm.    Past Medical History  Diagnosis Date  . Hypertension   . Acid reflux   . Gout   . Poor circulation   . Acid reflux   . Tremors of nervous system   . Diabetes mellitus without complication     Past Surgical History  Procedure Laterality Date  . No past surgeries      Current Outpatient Prescriptions  Medication Sig Dispense Refill  . allopurinol (ZYLOPRIM) 300 MG tablet Take 300 mg by mouth every morning.       . colchicine 0.6 MG tablet Take 0.6 mg by mouth every morning.       . indomethacin (INDOCIN) 25 MG capsule Take 25 mg by mouth 2 (two) times daily with a meal.        . lisinopril-hydrochlorothiazide (PRINZIDE,ZESTORETIC) 10-12.5 MG per tablet Take 1 tablet by mouth every morning.       . metFORMIN (GLUCOPHAGE) 500 MG tablet Take 500 mg by mouth 2 (two) times daily with a meal.      . omeprazole (PRILOSEC) 20 MG capsule Take 20 mg by mouth 2 (two) times daily.        No current facility-administered medications for this visit.    Allergies as of 12/05/2012  . (No Known Allergies)    Family History  Problem Relation Age of Onset  . Hypertension Mother   . Arthritis      History   Social History  . Marital Status: Divorced    Spouse Name: N/A    Number of Children: N/A  . Years of Education: 12th grade   Occupational History  . ?    Social History  Main Topics  . Smoking status: Current Every Day Smoker -- 0.25 packs/day for 30 years    Types: Cigarettes  . Smokeless tobacco: Never Used  . Alcohol Use: No  . Drug Use: No  . Sexual Activity: Yes    Birth Control/ Protection: Condom   Other Topics Concern  . Not on file   Social History Narrative  . No narrative on file    Review of Systems: Gen: Denies any fever, chills, loss of appetite, fatigue, weight loss. CV: Denies chest pain, heart palpitations, syncope, peripheral edema. Resp: +cough GI: see HPI GU : +urinary burning, sometimes hematuria  MS: Denies joint pain, muscle weakness, cramps, limited movement Derm: Denies rash, itching, dry skin Psych: Denies depression, anxiety, confusion or memory loss  Heme: Denies bruising, bleeding, and enlarged lymph nodes.  Physical Exam: BP 125/80  Pulse 100  Temp(Src) 98.2 F (36.8 C) (Oral)  Ht 6' (1.829 m)  Wt 297 lb 12.8 oz (135.081 kg)  BMI 40.38 kg/m2 General:   Alert and oriented. Well-developed, well-nourished, pleasant and cooperative. Head:  Normocephalic and atraumatic. Eyes:  Conjunctiva pink, sclera clear, no icterus.   Conjunctiva pink. Ears:  Normal auditory acuity. Nose:  No deformity, discharge,    or lesions. Mouth:  No deformity or lesions, mucosa pink and moist.  Neck:  Supple, without mass or thyromegaly. Lungs:  Clear to auscultation bilaterally, without wheezing, rales, or rhonchi.  Heart:  S1, S2 present without murmurs noted.  Abdomen:  +BS, soft, non-tender and non-distended. Without mass or HSM. No rebound or guarding. No hernias noted. Rectal:  External exam without abnormalities, no mass or stricture appreciated Msk:  Symmetrical without gross deformities. Normal posture. Extremities:  Without clubbing or edema. Neurologic:  Alert and  oriented x4;  grossly normal neurologically. Skin:  Intact, warm and dry without significant lesions or rashes Cervical Nodes:  No significant cervical  adenopathy. Psych:  Alert and cooperative. Normal mood and affect.   

## 2012-12-16 NOTE — Interval H&P Note (Signed)
History and Physical Interval Note:  12/16/2012 10:41 AM  Daniel Nicholson  has presented today for surgery, with the diagnosis of RECTAL BLEED  The various methods of treatment have been discussed with the patient and family. After consideration of risks, benefits and other options for treatment, the patient has consented to  Procedure(s) with comments: COLONOSCOPY (N/A) - 11:15 as a surgical intervention .  The patient's history has been reviewed, patient examined, no change in status, stable for surgery.  I have reviewed the patient's chart and labs.  Questions were answered to the patient's satisfaction.     No change. Colonoscopy per plan.The risks, benefits, limitations, alternatives and imponderables have been reviewed with the patient. Questions have been answered. All parties are agreeable.   Eula Listen

## 2012-12-17 ENCOUNTER — Other Ambulatory Visit: Payer: Self-pay | Admitting: Gastroenterology

## 2012-12-17 DIAGNOSIS — K760 Fatty (change of) liver, not elsewhere classified: Secondary | ICD-10-CM

## 2012-12-17 NOTE — Telephone Encounter (Signed)
Pt is aware of Banding in the office on 9/24 at 215 with RMR and appt card was mailed

## 2012-12-18 ENCOUNTER — Encounter (HOSPITAL_COMMUNITY): Payer: Self-pay | Admitting: Internal Medicine

## 2013-01-01 ENCOUNTER — Encounter: Payer: Self-pay | Admitting: Internal Medicine

## 2013-01-01 ENCOUNTER — Ambulatory Visit: Payer: Medicaid Other | Admitting: Internal Medicine

## 2013-01-01 VITALS — BP 142/92 | HR 88 | Temp 98.4°F | Ht 76.0 in | Wt 293.4 lb

## 2013-01-01 DIAGNOSIS — K649 Unspecified hemorrhoids: Secondary | ICD-10-CM

## 2013-01-01 NOTE — Progress Notes (Signed)
Patient recently underwent a colonoscopy for hematochezia. His found to have prominent hemorrhoids as a cause of bleeding. I offered and hemorrhoid banding. He came to the office today to have that done. Patient is hesitant about having procedure done. Wanted to discuss it further. We discussed the potential risks, benefits, limitations and alternatives at some length. His sister accompanied him today. I provided him a pamphlet on banding. I told him I felt banding would help him.  I recommended he go home and think about it and if he decided he wanted to try, he can give [place] a call and we can set it up.

## 2013-01-22 ENCOUNTER — Telehealth: Payer: Self-pay | Admitting: *Deleted

## 2013-01-22 NOTE — Telephone Encounter (Signed)
I explained to the patient that Medicaid has a $3 copayment for ov.  He stated he will come to our office before we close today and take care of that balance.

## 2013-01-22 NOTE — Telephone Encounter (Signed)
Pt came into office today stating he has a bill for 6.00$ pt states when he came in for his office visit he was told that he didn't have a copay. I made a copy of the bill and gave it to camille. I told pt that Durward Mallard will be in touch with him, that she was out of the office.

## 2013-01-29 ENCOUNTER — Encounter: Payer: Self-pay | Admitting: Internal Medicine

## 2013-01-29 ENCOUNTER — Ambulatory Visit (INDEPENDENT_AMBULATORY_CARE_PROVIDER_SITE_OTHER): Payer: Medicaid Other | Admitting: Internal Medicine

## 2013-01-29 VITALS — BP 151/97 | HR 84 | Temp 97.6°F | Wt 295.6 lb

## 2013-01-29 DIAGNOSIS — K648 Other hemorrhoids: Secondary | ICD-10-CM

## 2013-01-29 NOTE — Progress Notes (Signed)
[facility] banding procedure note:  The patient presents with symptomatic grade 2 hemorrhoids, unresponsive to maximal medical therapy, requesting rubber band ligation of his/her hemorrhoidal disease. All risks, benefits, and alternative forms of therapy were described and informed consent was obtained.  In the left lateral decubitus position,  anoscopic examination revealed a particularly prominent grade 2 right posterior hemorrhoid.  The decision was made to band the right posterior internal hemorrhoid, and the [facility] O'Regan System was used to perform band ligation without complication. Digital rectal exam was performed prior to the procedure. Digital anorectal examination was then performed after band placement and to adjust the banded tissue as required. The band was palpated to be in excellent position and the patient had a pinching her pain following deployment. The patient was discharged home without pain or other issues. No complications were encountered and the patient tolerated the procedure well.  Office visit in 2-3 weeks to reassess and decide about additional band placement

## 2013-01-29 NOTE — Patient Instructions (Signed)
Continue to avoid straining with a bowel movement  Continue daily fiber supplement  Call with any problems.  Office visit with [place] in 2-3 weeks to decide about further banding

## 2013-02-14 ENCOUNTER — Encounter: Payer: Self-pay | Admitting: Internal Medicine

## 2013-02-26 ENCOUNTER — Encounter: Payer: Medicaid Other | Admitting: Internal Medicine

## 2013-03-11 ENCOUNTER — Encounter: Payer: Medicaid Other | Admitting: Internal Medicine

## 2013-03-25 ENCOUNTER — Ambulatory Visit (INDEPENDENT_AMBULATORY_CARE_PROVIDER_SITE_OTHER): Payer: Medicaid Other | Admitting: Internal Medicine

## 2013-03-25 ENCOUNTER — Encounter (INDEPENDENT_AMBULATORY_CARE_PROVIDER_SITE_OTHER): Payer: Self-pay

## 2013-03-25 ENCOUNTER — Encounter: Payer: Self-pay | Admitting: Internal Medicine

## 2013-03-25 VITALS — BP 146/98 | HR 60 | Temp 97.3°F | Ht 76.0 in | Wt 298.4 lb

## 2013-03-25 DIAGNOSIS — K648 Other hemorrhoids: Secondary | ICD-10-CM

## 2013-03-25 NOTE — Progress Notes (Signed)
[facility] banding procedure note:   Patient is status post banding of right posterior hemorrhoid a few weeks ago. Continues to have some bleeding itching and seepage but somewhat improved. Taking fiber supplement everyday was too much having upwards of 4-5  loose stools daily. Has cut back to every other day to every 2 days with better results. I discussed proceeding with banding the other 2 hemorrhoids piles-one at a time.. Patient has asked to do both bands today.  I explained to him it would best to do 1 band per session.   I elected to go ahead and offer banding of the left lateral hemorrhoid today.  The decision was made to band the left lateral internal hemorrhoid and the [facility] O'Regan System was used to perform band ligation without complication. Digital anorectal examination was then performed to assure proper positioning of the band and to adjust the banded tissue as required. Followup digital rectal exam demonstrated the band to be in good position. Patient had no pain or itching after placement The patient was discharged home without pain or other issues. Dietary and behavioral recommendations were given, along with follow-up instructions. The patient will return in 2-3  weeks for followup and possible additional banding as required.  No complications were encountered and the patient tolerated the procedure well.

## 2013-03-25 NOTE — Patient Instructions (Signed)
Avoid straining.  Okay to take fiber supplement every other day or every 2 days as he is currently doing to get good results.  Limit toilet time to 2-3 minutes  Call with any interim problems  Schedule followup appointment in 2-3 weeks from now

## 2013-04-22 ENCOUNTER — Encounter: Payer: Self-pay | Admitting: Internal Medicine

## 2013-04-22 ENCOUNTER — Ambulatory Visit (INDEPENDENT_AMBULATORY_CARE_PROVIDER_SITE_OTHER): Payer: Medicaid Other | Admitting: Internal Medicine

## 2013-04-22 ENCOUNTER — Encounter (INDEPENDENT_AMBULATORY_CARE_PROVIDER_SITE_OTHER): Payer: Self-pay

## 2013-04-22 VITALS — BP 144/92 | HR 87 | Temp 98.4°F | Wt 298.2 lb

## 2013-04-22 DIAGNOSIS — K648 Other hemorrhoids: Secondary | ICD-10-CM

## 2013-04-22 MED ORDER — HYDROCORTISONE 2.5 % RE CREA: 1.0000 "application " | TOPICAL_CREAM | Freq: Two times a day (BID) | RECTAL | Status: DC

## 2013-04-22 NOTE — Progress Notes (Signed)
[facility] banding procedure note:  The patient presents with symptomatic hemorrhoids. He is status post banding of the left lateral and right posterior pile. He states overall he is doing much better. He is here to get his right anterior hemorrhoid banded; . All risks, benefits, and alternative forms of therapy were described and informed consent was obtained.  In the left lateral decubitus position digital rectal exam performed. The decision was made to band the right anterior internal hemorrhoid; the [facility] O'Regan System was used to perform band ligation without complication. Digital anorectal examination was then performed to assure proper positioning of the band.  Band was in excellent position. No pinching or pain after this procedure was performed. The patient was discharged home without pain or other issues. Dietary and behavioral recommendations were given; prescription for Anusol cream as needed provided.

## 2013-04-22 NOTE — Patient Instructions (Addendum)
Avoid straining.  Benefiber 2 teaspoons twice daily  Limit toilet time to 2-3 minutes  Anusol cream to ano-rectum twice daily as needed  Call with any interim problems  Schedule followup appointment in 2-3 months from now

## 2013-07-08 ENCOUNTER — Telehealth: Payer: Self-pay

## 2013-07-08 MED ORDER — HYDROCORTISONE 2.5 % RE CREA: 1.0000 "application " | TOPICAL_CREAM | Freq: Two times a day (BID) | RECTAL | Status: DC

## 2013-07-08 NOTE — Telephone Encounter (Signed)
Pt is calling because he is having burn and itching around his rectal area. He does not think it is coming from his bands. He used the [facility] in [place].  Please advise.

## 2013-07-08 NOTE — Telephone Encounter (Signed)
I sent in more Anusol. Call if no improvement.

## 2013-07-09 NOTE — Telephone Encounter (Signed)
Pt is aware of medication at drug store.

## 2013-07-09 NOTE — Telephone Encounter (Signed)
Okay. Will try Lotrisone cream. Dispense one unit. Apply to the anorectum twice daily as needed. 1 refill.

## 2013-07-21 ENCOUNTER — Ambulatory Visit (INDEPENDENT_AMBULATORY_CARE_PROVIDER_SITE_OTHER): Payer: Medicaid Other | Admitting: Gastroenterology

## 2013-07-21 ENCOUNTER — Encounter: Payer: Self-pay | Admitting: Gastroenterology

## 2013-07-21 ENCOUNTER — Encounter (INDEPENDENT_AMBULATORY_CARE_PROVIDER_SITE_OTHER): Payer: Self-pay

## 2013-07-21 VITALS — BP 128/85 | HR 97 | Temp 98.1°F | Ht 76.0 in | Wt 299.8 lb

## 2013-07-21 DIAGNOSIS — K760 Fatty (change of) liver, not elsewhere classified: Secondary | ICD-10-CM

## 2013-07-21 DIAGNOSIS — K7689 Other specified diseases of liver: Secondary | ICD-10-CM

## 2013-07-21 DIAGNOSIS — K625 Hemorrhage of anus and rectum: Secondary | ICD-10-CM

## 2013-07-21 MED ORDER — HYDROCORTISONE 2.5 % RE CREA: 1.0000 "application " | TOPICAL_CREAM | Freq: Two times a day (BID) | RECTAL | Status: DC

## 2013-07-21 NOTE — Assessment & Plan Note (Signed)
Secondary to internal hemorrhoids. Banding X 3 completed, with last banding Jan 2015. Since that time, patient noted itching that was actually located in intergluteal cleft (picture brought in by patient). ?Yeast. Keep area dry, will refill proctosol for any further rectal discomfort. Contact office if recurrent symptoms or rectal bleeding.

## 2013-07-21 NOTE — Assessment & Plan Note (Signed)
Recent LFTs normal. No recent US abdomen. Hepatomegaly on exam. proceed with US abdomen.

## 2013-07-21 NOTE — Progress Notes (Signed)
Referring Provider: Avon GullyFanta, Tesfaye, MD Primary Care Physician:  Avon GullyFANTA,TESFAYE, MD Primary GI: Dr Jena Gaussourk   Chief Complaint  Patient presents with  . Follow-up    HPI:   Daniel Nicholson presents today in follow-up with history of internal hemorrhoids s/p right posterior, left lateral, right anterior banding. Last banding procedure Jan 2015. Called in with rectal burning and itching a few weeks ago. Given Lotrisone and Proctosol cream. Thinks he is allergic to Lotrisone. States rectum became very red with this. However, looking at a picture that he brought, it appears to be in the intergluteal cleft, not at rectum.  Proctosol cream helped significantly. No rectal bleeding. No constipation, diarrhea.    Past Medical History  Diagnosis Date  . Hypertension   . Acid reflux   . Gout   . Poor circulation   . Tremors of nervous system   . Diabetes mellitus without complication   . Hemorrhoids     s/p right posterior, left lateral, right anterior banding. Last banding procedure Jan 2015.     Past Surgical History  Procedure Laterality Date  . None    . Colonoscopy N/A 12/16/2012    Dr. Jena Gaussourk- internal hemorrhoids otherwise normal. Screening in 2024.     Current Outpatient Prescriptions  Medication Sig Dispense Refill  . allopurinol (ZYLOPRIM) 300 MG tablet Take 300 mg by mouth every morning.       . colchicine 0.6 MG tablet Take 0.6 mg by mouth every morning.       . indomethacin (INDOCIN) 25 MG capsule Take 25 mg by mouth 2 (two) times daily with a meal.        . lisinopril-hydrochlorothiazide (PRINZIDE,ZESTORETIC) 10-12.5 MG per tablet Take 1 tablet by mouth every morning.       . metFORMIN (GLUCOPHAGE) 500 MG tablet Take 500 mg by mouth 2 (two) times daily with a meal.      . omeprazole (PRILOSEC) 20 MG capsule Take 20 mg by mouth 2 (two) times daily.       . hydrocortisone (ANUSOL-HC) 2.5 % rectal cream Place 1 application rectally 2 (two) times daily.  30 g  0   No current  facility-administered medications for this visit.    Allergies as of 07/21/2013  . (No Known Allergies)    Family History  Problem Relation Age of Onset  . Hypertension Mother   . Arthritis    . Colon cancer Neg Hx     History   Social History  . Marital Status: Divorced    Spouse Name: N/A    Number of Children: N/A  . Years of Education: 12th grade   Occupational History  . unemployed    Social History Main Topics  . Smoking status: Current Every Day Smoker -- 0.25 packs/day for 30 years    Types: Cigarettes  . Smokeless tobacco: Never Used     Comment: Smokes 6-7 cigarettes daily  . Alcohol Use: No  . Drug Use: No  . Sexual Activity: Yes    Birth Control/ Protection: Condom   Other Topics Concern  . None   Social History Narrative  . None    Review of Systems: As mentioned in HPI.   Physical Exam: BP 128/85  Pulse 97  Temp(Src) 98.1 F (36.7 C) (Oral)  Ht 6\' 4"  (1.93 m)  Wt 299 lb 12.8 oz (135.988 kg)  BMI 36.51 kg/m2 General:   Alert and oriented. No distress noted. Pleasant and cooperative.  Head:  Normocephalic  and atraumatic. Eyes:  Conjuctiva clear without scleral icterus. Mouth:  Oral mucosa pink and moist. Good dentition. No lesions. Heart:  S1, S2 present without murmurs, rubs, or gallops. Regular rate and rhythm. Abdomen:  +BS, soft, non-tender and non-distended. No rebound or guarding. Hepatomegaly with liver margin palpable several at least 4 fingerbreadths below right costal margin Msk:  Symmetrical without gross deformities. Normal posture. Extremities:  Without edema. Neurologic:  Alert and  oriented x4;  grossly normal neurologically. Skin:  Intact without significant lesions or rashes. Psych:  Alert and cooperative. Normal mood and affect.

## 2013-07-21 NOTE — Patient Instructions (Signed)
We have scheduled you for an ultrasound of your abdomen.   I have refilled the Proctosol.  Please call if you have any further rectal discomfort or notice any rectal bleeding.

## 2013-07-22 NOTE — Progress Notes (Signed)
cc'd to pcp 

## 2013-07-25 ENCOUNTER — Ambulatory Visit (HOSPITAL_COMMUNITY)
Admission: RE | Admit: 2013-07-25 | Discharge: 2013-07-25 | Disposition: A | Discharge status: Home / Self Care | Payer: Medicaid Other | Source: Ambulatory Visit | Attending: Gastroenterology | Admitting: Gastroenterology

## 2013-07-25 DIAGNOSIS — K7689 Other specified diseases of liver: Secondary | ICD-10-CM | POA: Insufficient documentation

## 2013-07-25 DIAGNOSIS — R160 Hepatomegaly, not elsewhere classified: Secondary | ICD-10-CM | POA: Insufficient documentation

## 2013-07-25 DIAGNOSIS — K760 Fatty (change of) liver, not elsewhere classified: Secondary | ICD-10-CM

## 2013-07-30 NOTE — Progress Notes (Signed)
Quick Note:  Liver upper limits of normal in size. Fatty liver. No significant abnormalities. Needs to follow low fat diet, serial measurements of LFTs. Recent set on file. ______

## 2013-07-31 ENCOUNTER — Telehealth: Payer: Self-pay | Admitting: Internal Medicine

## 2013-07-31 NOTE — Telephone Encounter (Signed)
Opened in Error.

## 2013-10-21 ENCOUNTER — Telehealth: Payer: Self-pay | Admitting: Internal Medicine

## 2013-10-21 NOTE — Telephone Encounter (Signed)
Pt has had several bandings already and was told to call us if he had any more problems. He said the past 2 days he is starting to notice blood when he wipes. Please advise if he needs OV or banding. [phone]

## 2013-10-23 NOTE — Telephone Encounter (Signed)
Tried to call pt- LMOM 

## 2013-10-23 NOTE — Telephone Encounter (Signed)
I spoke with pt, he said for two days he was having mucous and blood coming from his rectum. Slight diarrhea. He said he is feeling much better now and hasnt seen any more blood. He thinks he may have had a virus.

## 2013-10-23 NOTE — Telephone Encounter (Signed)
He said he doesn't want to do anything else at this time and will call back if he has any further problems.

## 2013-10-23 NOTE — Telephone Encounter (Signed)
Noted  

## 2013-11-20 ENCOUNTER — Other Ambulatory Visit: Payer: Self-pay

## 2013-11-20 DIAGNOSIS — K760 Fatty (change of) liver, not elsewhere classified: Secondary | ICD-10-CM

## 2013-11-25 LAB — HEPATIC FUNCTION PANEL
ALK PHOS: 45 U/L (ref 39–117)
ALT: 89 U/L — ABNORMAL HIGH (ref 0–53)
AST: 55 U/L — AB (ref 0–37)
Albumin: 4.5 g/dL (ref 3.5–5.2)
BILIRUBIN DIRECT: 0.1 mg/dL (ref 0.0–0.3)
BILIRUBIN TOTAL: 0.6 mg/dL (ref 0.2–1.2)
Indirect Bilirubin: 0.5 mg/dL (ref 0.2–1.2)
Total Protein: 6.7 g/dL (ref 6.0–8.3)

## 2013-12-03 NOTE — Progress Notes (Signed)
Quick Note:  Mild elevation in transaminases, AST 55 and ALT 89. Last drawn a year ago.  I say let's have him follow a strict low fat diet, avoid ETOH, goal of 1 lbs weight loss per week.  Recheck in 3 months.  If still elevated, need to proceed with at the minimum viral markers. ______

## 2013-12-15 ENCOUNTER — Emergency Department (HOSPITAL_COMMUNITY)
Admission: EM | Admit: 2013-12-15 | Discharge: 2013-12-15 | Disposition: A | Discharge status: Home / Self Care | Payer: Medicaid Other | Attending: Emergency Medicine | Admitting: Emergency Medicine

## 2013-12-15 ENCOUNTER — Encounter (HOSPITAL_COMMUNITY): Payer: Self-pay | Admitting: Emergency Medicine

## 2013-12-15 DIAGNOSIS — Z791 Long term (current) use of non-steroidal anti-inflammatories (NSAID): Secondary | ICD-10-CM | POA: Diagnosis not present

## 2013-12-15 DIAGNOSIS — M109 Gout, unspecified: Secondary | ICD-10-CM | POA: Diagnosis not present

## 2013-12-15 DIAGNOSIS — Z8719 Personal history of other diseases of the digestive system: Secondary | ICD-10-CM | POA: Diagnosis not present

## 2013-12-15 DIAGNOSIS — R739 Hyperglycemia, unspecified: Secondary | ICD-10-CM

## 2013-12-15 DIAGNOSIS — E119 Type 2 diabetes mellitus without complications: Secondary | ICD-10-CM | POA: Diagnosis present

## 2013-12-15 DIAGNOSIS — Z79899 Other long term (current) drug therapy: Secondary | ICD-10-CM | POA: Diagnosis not present

## 2013-12-15 DIAGNOSIS — K219 Gastro-esophageal reflux disease without esophagitis: Secondary | ICD-10-CM | POA: Insufficient documentation

## 2013-12-15 DIAGNOSIS — E669 Obesity, unspecified: Secondary | ICD-10-CM | POA: Diagnosis not present

## 2013-12-15 DIAGNOSIS — I10 Essential (primary) hypertension: Secondary | ICD-10-CM | POA: Insufficient documentation

## 2013-12-15 DIAGNOSIS — F17200 Nicotine dependence, unspecified, uncomplicated: Secondary | ICD-10-CM | POA: Diagnosis not present

## 2013-12-15 LAB — CBG MONITORING, ED: Glucose-Capillary: 270 mg/dL — ABNORMAL HIGH (ref 70–99)

## 2013-12-15 LAB — CBC WITH DIFFERENTIAL/PLATELET
BASOS ABS: 0 10*3/uL (ref 0.0–0.1)
Basophils Relative: 0 % (ref 0–1)
EOS PCT: 6 % — AB (ref 0–5)
Eosinophils Absolute: 0.4 10*3/uL (ref 0.0–0.7)
HEMATOCRIT: 42.5 % (ref 39.0–52.0)
HEMOGLOBIN: 15.3 g/dL (ref 13.0–17.0)
LYMPHS PCT: 47 % — AB (ref 12–46)
Lymphs Abs: 3.2 10*3/uL (ref 0.7–4.0)
MCH: 30.7 pg (ref 26.0–34.0)
MCHC: 36 g/dL (ref 30.0–36.0)
MCV: 85.3 fL (ref 78.0–100.0)
MONO ABS: 0.3 10*3/uL (ref 0.1–1.0)
MONOS PCT: 5 % (ref 3–12)
Neutro Abs: 2.9 10*3/uL (ref 1.7–7.7)
Neutrophils Relative %: 42 % — ABNORMAL LOW (ref 43–77)
Platelets: 186 10*3/uL (ref 150–400)
RBC: 4.98 MIL/uL (ref 4.22–5.81)
RDW: 13.1 % (ref 11.5–15.5)
WBC: 6.8 10*3/uL (ref 4.0–10.5)

## 2013-12-15 LAB — BASIC METABOLIC PANEL
ANION GAP: 22 — AB (ref 5–15)
BUN: 13 mg/dL (ref 6–23)
CALCIUM: 9.8 mg/dL (ref 8.4–10.5)
CHLORIDE: 99 meq/L (ref 96–112)
CO2: 19 meq/L (ref 19–32)
CREATININE: 1.03 mg/dL (ref 0.50–1.35)
GFR calc Af Amer: >90 mL/min (ref >90)
GFR calc non Af Amer: 87 mL/min — ABNORMAL LOW (ref >90)
GLUCOSE: 302 mg/dL — AB (ref 70–99)
Potassium: 4.1 mEq/L (ref 3.7–5.3)
Sodium: 140 mEq/L (ref 137–147)

## 2013-12-15 MED ORDER — SODIUM CHLORIDE 0.9 % IV BOLUS (SEPSIS)
1000.0000 mL | Freq: Once | INTRAVENOUS | Status: AC
  Administered 2013-12-15: 1000 mL via INTRAVENOUS

## 2013-12-15 MED ORDER — METFORMIN HCL 500 MG PO TABS: ORAL_TABLET | ORAL | Status: DC

## 2013-12-15 NOTE — ED Notes (Signed)
Pt states CBG elevated recently, last checked at home was 319, on oral agents for DM

## 2013-12-15 NOTE — ED Notes (Signed)
Pt c/o high blood sugars. Denies any nausea, abdominal pain, increased thirst or urinary frequency. Nad noted at present.

## 2013-12-15 NOTE — ED Notes (Signed)
Pt given 3 - 500 mg tablets of Metformin to replace home meds dropped by pharmacy tech.

## 2013-12-15 NOTE — ED Provider Notes (Signed)
CSN: [id number]     Arrival date & time 12/15/13  2134 History  This chart was scribed for Linwood Dibbles, MD by Milly Jakob, ED Scribe. The patient was seen in room APA18/APA18. Patient's care was started at 9:48 PM.   Chief Complaint  Patient presents with  . Hyperglycemia   The history is provided by the patient. No language interpreter was used.   HPI Comments: Daniel Nicholson is a 43 y.o. male with a history of HTN and DM who presents to the Emergency Department complaining of an abnormally high blood sugar of 319. He reports that his doctor recently prescribed him a new medication for diabetic nerve pain, after which his sugar has started running high. He denies trouble managing his DM in the past. He denies fevers, vomiting, and diarrhea.   Past Medical History  Diagnosis Date  . Hypertension   . Acid reflux   . Gout   . Poor circulation   . Tremors of nervous system   . Diabetes mellitus without complication   . Hemorrhoids     s/p right posterior, left lateral, right anterior banding. Last banding procedure Jan 2015.    Past Surgical History  Procedure Laterality Date  . None    . Colonoscopy N/A 12/16/2012    Dr. Jena Gauss- internal hemorrhoids otherwise normal. Screening in 2024.    Family History  Problem Relation Age of Onset  . Hypertension Mother   . Arthritis    . Colon cancer Neg Hx    History  Substance Use Topics  . Smoking status: Current Every Day Smoker -- 0.25 packs/day for 30 years    Types: Cigarettes  . Smokeless tobacco: Never Used     Comment: Smokes 6-7 cigarettes daily  . Alcohol Use: Yes     Comment: occ. beer    Review of Systems  Endocrine:       Hyperglycemia  All other systems reviewed and are negative.   Allergies  Review of patient's allergies indicates no known allergies.  Home Medications   Prior to Admission medications   Medication Sig Start Date End Date Taking? Authorizing Provider  allopurinol (ZYLOPRIM) 300 MG tablet  Take 300 mg by mouth every morning.    Yes Historical Provider, MD  atorvastatin (LIPITOR) 20 MG tablet Take 20 mg by mouth daily.   Yes Historical Provider, MD  colchicine 0.6 MG tablet Take 0.6 mg by mouth every morning.    Yes Historical Provider, MD  gabapentin (NEURONTIN) 300 MG capsule Take 600 mg by mouth 2 (two) times daily.   Yes Historical Provider, MD  indomethacin (INDOCIN) 25 MG capsule Take 25 mg by mouth 2 (two) times daily with a meal.     Yes Historical Provider, MD  lisinopril-hydrochlorothiazide (PRINZIDE,ZESTORETIC) 10-12.5 MG per tablet Take 1 tablet by mouth every morning.    Yes Historical Provider, MD  loratadine (CLARITIN) 10 MG tablet Take 10 mg by mouth daily.   Yes Historical Provider, MD  omeprazole (PRILOSEC) 20 MG capsule Take 20 mg by mouth 2 (two) times daily.    Yes Historical Provider, MD  metFORMIN (GLUCOPHAGE) 500 MG tablet Take 1000 mg in the AM and 500 mg in the evening 12/15/13   Linwood Dibbles, MD   Triage Vitals: BP 144/96  Pulse 99  Temp(Src) 98.2 F (36.8 C) (Oral)  Resp 18  Ht  (1.93 m)  Wt 305 lb (138.347 kg)  BMI 37.14 kg/m2  SpO2 99% Physical Exam  Nursing  note and vitals reviewed. Constitutional: He appears well-developed. No distress.  Obese.  HENT:  Head: Normocephalic and atraumatic.  Right Ear: External ear normal.  Left Ear: External ear normal.  Eyes: Conjunctivae are normal. Right eye exhibits no discharge. Left eye exhibits no discharge. No scleral icterus.  Neck: Neck supple. No tracheal deviation present.  Cardiovascular: Normal rate, regular rhythm and intact distal pulses.   Pulmonary/Chest: Effort normal and breath sounds normal. No stridor. No respiratory distress. He has no wheezes. He has no rales.  Abdominal: Soft. Bowel sounds are normal. He exhibits no distension. There is no tenderness. There is no rebound and no guarding.  Musculoskeletal: He exhibits no edema and no tenderness.  Neurological: He is alert. He has  normal strength. No cranial nerve deficit (no facial droop, extraocular movements intact, no slurred speech) or sensory deficit. He exhibits normal muscle tone. He displays no seizure activity. Coordination normal.  Skin: Skin is warm and dry. No rash noted.  Psychiatric: He has a normal mood and affect.    ED Course  Procedures (including critical care time) DIAGNOSTIC STUDIES: Oxygen Saturation is 99% on room air, normal by my interpretation.    COORDINATION OF CARE: 9:53 PM-Discussed treatment plan which includes blood work with pt at bedside and pt agreed to plan.   Labs Review Labs Reviewed  CBC WITH DIFFERENTIAL - Abnormal; Notable for the following:    Neutrophils Relative % 42 (*)    Lymphocytes Relative 47 (*)    Eosinophils Relative 6 (*)    All other components within normal limits  BASIC METABOLIC PANEL - Abnormal; Notable for the following:    Glucose, Bld 302 (*)    GFR calc non Af Amer 87 (*)    Anion gap 22 (*)    All other components within normal limits  CBG MONITORING, ED - Abnormal; Notable for the following:    Glucose-Capillary 270 (*)    All other components within normal limits    MDM   Final diagnoses:  Hyperglycemia    Pt with hyperglycemia.  No DKA.  He is asymptomatic otherwise.  I will have him increase his metformin.  Follow up with his PCP to discuss his diabetes management.   Linwood Dibbles, MD 12/15/13 [phone]

## 2013-12-15 NOTE — Discharge Instructions (Signed)

## 2013-12-15 NOTE — ED Notes (Signed)
MD at bedside. 

## 2013-12-16 ENCOUNTER — Other Ambulatory Visit: Payer: Self-pay | Admitting: Gastroenterology

## 2013-12-16 DIAGNOSIS — K760 Fatty (change of) liver, not elsewhere classified: Secondary | ICD-10-CM

## 2014-01-11 ENCOUNTER — Emergency Department (HOSPITAL_COMMUNITY)
Admission: EM | Admit: 2014-01-11 | Discharge: 2014-01-11 | Disposition: A | Discharge status: Home / Self Care | Payer: Medicaid Other | Attending: Emergency Medicine | Admitting: Emergency Medicine

## 2014-01-11 ENCOUNTER — Encounter (HOSPITAL_COMMUNITY): Payer: Self-pay | Admitting: Emergency Medicine

## 2014-01-11 DIAGNOSIS — E119 Type 2 diabetes mellitus without complications: Secondary | ICD-10-CM | POA: Diagnosis not present

## 2014-01-11 DIAGNOSIS — Z79899 Other long term (current) drug therapy: Secondary | ICD-10-CM | POA: Diagnosis not present

## 2014-01-11 DIAGNOSIS — I10 Essential (primary) hypertension: Secondary | ICD-10-CM | POA: Diagnosis not present

## 2014-01-11 DIAGNOSIS — M109 Gout, unspecified: Secondary | ICD-10-CM | POA: Diagnosis not present

## 2014-01-11 DIAGNOSIS — S3120XA Unspecified open wound of penis, initial encounter: Secondary | ICD-10-CM | POA: Insufficient documentation

## 2014-01-11 DIAGNOSIS — S39848A Other specified injuries of external genitals, initial encounter: Secondary | ICD-10-CM | POA: Diagnosis present

## 2014-01-11 DIAGNOSIS — S3121XA Laceration without foreign body of penis, initial encounter: Secondary | ICD-10-CM

## 2014-01-11 DIAGNOSIS — K219 Gastro-esophageal reflux disease without esophagitis: Secondary | ICD-10-CM | POA: Diagnosis not present

## 2014-01-11 DIAGNOSIS — N501 Vascular disorders of male genital organs: Secondary | ICD-10-CM | POA: Diagnosis not present

## 2014-01-11 DIAGNOSIS — Z720 Tobacco use: Secondary | ICD-10-CM | POA: Insufficient documentation

## 2014-01-11 NOTE — ED Provider Notes (Signed)
CSN: [id number]     Arrival date & time 01/11/14  [id number] History  This chart was scribed for Enid SkeensJoshua M Katelan Hirt, MD, by Yevette EdwardsAngela Bracken, ED Scribe. This patient was seen in room APA06/APA06 and the patient's care was started at 8:17 AM.   First MD Initiated Contact with Patient 01/11/14 [phone]     Chief Complaint  Patient presents with  . Penis Injury    Patient is a 43 y.o. male presenting with penile injury. The history is provided by the patient. No language interpreter was used.  Penis Injury This is a new problem. The current episode started 1 to 2 hours ago. The problem occurs rarely. The problem has been rapidly improving. Pertinent negatives include no abdominal pain. Nothing aggravates the symptoms. Nothing relieves the symptoms. He has tried nothing for the symptoms. The treatment provided no relief.   HPI Comments: Daniel Nicholson is a 43 y.o. male who presents to the Emergency Department complaining of bleeding from his penile tissue which he noticed this morning prior to intercourse. There is no active bleeding. He denies penile pain and abdominal pain. He denies a h/o similar symptoms. He also denies use of blood thinners. The pt endorses a h/o HTN, DM, gout, GERD, and neuropathy. He is a current smoker.   Past Medical History  Diagnosis Date  . Hypertension   . Acid reflux   . Gout   . Poor circulation   . Tremors of nervous system   . Diabetes mellitus without complication   . Hemorrhoids     s/p right posterior, left lateral, right anterior banding. Last banding procedure Jan 2015.    Past Surgical History  Procedure Laterality Date  . None    . Colonoscopy N/A 12/16/2012    Dr. Jena Gaussourk- internal hemorrhoids otherwise normal. Screening in 2024.    Family History  Problem Relation Age of Onset  . Hypertension Mother   . Arthritis    . Colon cancer Neg Hx    History  Substance Use Topics  . Smoking status: Current Every Day Smoker -- 0.25 packs/day for 30 years     Types: Cigarettes  . Smokeless tobacco: Never Used     Comment: Smokes 6-7 cigarettes daily  . Alcohol Use: Yes     Comment: occ. beer    Review of Systems  Gastrointestinal: Negative for abdominal pain.    A complete 10 system review of systems was obtained, and all systems were negative except where indicated in the HPI and PE.    Allergies  Review of patient's allergies indicates no known allergies.  Home Medications   Prior to Admission medications   Medication Sig Start Date End Date Taking? Authorizing Provider  allopurinol (ZYLOPRIM) 300 MG tablet Take 300 mg by mouth every morning.     Historical Provider, MD  atorvastatin (LIPITOR) 20 MG tablet Take 20 mg by mouth daily.    Historical Provider, MD  colchicine 0.6 MG tablet Take 0.6 mg by mouth every morning.     Historical Provider, MD  gabapentin (NEURONTIN) 300 MG capsule Take 600 mg by mouth 2 (two) times daily.    Historical Provider, MD  indomethacin (INDOCIN) 25 MG capsule Take 25 mg by mouth 2 (two) times daily with a meal.      Historical Provider, MD  lisinopril-hydrochlorothiazide (PRINZIDE,ZESTORETIC) 10-12.5 MG per tablet Take 1 tablet by mouth every morning.     Historical Provider, MD  loratadine (CLARITIN) 10 MG tablet Take 10 mg by  mouth daily.    Historical Provider, MD  metFORMIN (GLUCOPHAGE) 500 MG tablet Take 1000 mg in the AM and 500 mg in the evening 12/15/13   Linwood Dibbles, MD  omeprazole (PRILOSEC) 20 MG capsule Take 20 mg by mouth 2 (two) times daily.     Historical Provider, MD   Triage Vitals: BP 139/107  Pulse 90  Temp(Src) 98.1 F (36.7 C) (Oral)  Resp 18  Ht 6\' 4"  (1.93 m)  Wt 300 lb (136.079 kg)  BMI 36.53 kg/m2  SpO2 97%  Physical Exam  Nursing note and vitals reviewed. Constitutional: He is oriented to person, place, and time. He appears well-developed and well-nourished. No distress.  HENT:  Head: Normocephalic and atraumatic.  Eyes: Conjunctivae and EOM are normal.  Neck: Neck  supple. No tracheal deviation present.  Cardiovascular: Normal rate.   Pulmonary/Chest: Effort normal. No respiratory distress.  Abdominal: Soft. There is no tenderness.  Genitourinary: No penile tenderness.  2 mm papule (small skin tag) with dried blood at tip. Penis is non-tender. Testicles no swelling, no tenderness, normal position.   Musculoskeletal: Normal range of motion.  Neurological: He is alert and oriented to person, place, and time.  Skin: Skin is warm and dry.  Psychiatric: He has a normal mood and affect. His behavior is normal.    ED Course  Procedures (including critical care time) Penile shaft skin tear/laceration Distal shaft of the penis 2 mm area a small amount of blood, Dermabond applied conservatively. Bleeding controlled. Performed by myself  DIAGNOSTIC STUDIES: Oxygen Saturation is 97% on room air, normal by my interpretation.    COORDINATION OF CARE:  8:21 AM- Discussed treatment plan with patient, and the patient agreed to the plan.   8:25 AM- Applied skin glue.   Labs Review Labs Reviewed - No data to display  Imaging Review No results found.   EKG Interpretation None      MDM   Final diagnoses:  Bleeding of penis  Laceration of penis, initial encounter   Well-appearing patient with small skin tear/laceration to the distal shaft of his penis. Bleeding controlled in ER. Small amount of Dermabond placed on the site to help prevent recurrent bleeding. Discussed supportive care and reasons to return.  Results and differential diagnosis were discussed with the patient/parent/guardian. Close follow up outpatient was discussed, comfortable with the plan.   Medications - No data to display  Filed Vitals:   01/11/14 0753 01/11/14 0758 01/11/14 0815  BP: 139/107    Pulse: 90  86  Temp: 98.1 F (36.7 C)    TempSrc: Oral    Resp: 18 18   Height: 6\' 4"  (1.93 m)    Weight: 300 lb (136.079 kg)    SpO2: 96% 97% 98%    Final diagnoses:   Bleeding of penis  Laceration of penis, initial encounter      Enid Skeens, MD 01/11/14 [phone]

## 2014-01-11 NOTE — ED Notes (Signed)
Derma bond applied to penis by MD at this time.

## 2014-01-11 NOTE — ED Notes (Signed)
MD at bedside. 

## 2014-01-11 NOTE — ED Notes (Signed)
Pt noted that he was bleeding from a vessel on his penis this am.  Pts wife states that there was "quite a bit of blood".  Bleeding controlled at this time.

## 2014-01-11 NOTE — Discharge Instructions (Signed)
Keep area dry for 24 hours. If bleeding resumes hold direct pressure for 10 minutes at a time.  If you were given medicines take as directed.  If you are on coumadin or contraceptives realize their levels and effectiveness is altered by many different medicines.  If you have any reaction (rash, tongues swelling, other) to the medicines stop taking and see a physician.   Please follow up as directed and return to the ER or see a physician for new or worsening symptoms.  Thank you. Filed Vitals:   01/11/14 0753 01/11/14 0758 01/11/14 0815  BP: 139/107    Pulse: 90  86  Temp: 98.1 F (36.7 C)    TempSrc: Oral    Resp: 18 18   Height: 6\' 4"  (1.93 m)    Weight: 300 lb (136.079 kg)    SpO2: 96% 97% 98%

## 2014-02-05 ENCOUNTER — Encounter (HOSPITAL_COMMUNITY): Payer: Self-pay | Admitting: Emergency Medicine

## 2014-02-05 ENCOUNTER — Emergency Department (HOSPITAL_COMMUNITY)
Admission: EM | Admit: 2014-02-05 | Discharge: 2014-02-05 | Disposition: A | Discharge status: Home / Self Care | Payer: Medicaid Other | Attending: Emergency Medicine | Admitting: Emergency Medicine

## 2014-02-05 DIAGNOSIS — Z720 Tobacco use: Secondary | ICD-10-CM | POA: Diagnosis not present

## 2014-02-05 DIAGNOSIS — R531 Weakness: Secondary | ICD-10-CM | POA: Insufficient documentation

## 2014-02-05 DIAGNOSIS — E1165 Type 2 diabetes mellitus with hyperglycemia: Secondary | ICD-10-CM | POA: Insufficient documentation

## 2014-02-05 DIAGNOSIS — M109 Gout, unspecified: Secondary | ICD-10-CM | POA: Diagnosis not present

## 2014-02-05 DIAGNOSIS — R110 Nausea: Secondary | ICD-10-CM | POA: Insufficient documentation

## 2014-02-05 DIAGNOSIS — I10 Essential (primary) hypertension: Secondary | ICD-10-CM | POA: Diagnosis not present

## 2014-02-05 DIAGNOSIS — K219 Gastro-esophageal reflux disease without esophagitis: Secondary | ICD-10-CM | POA: Diagnosis not present

## 2014-02-05 DIAGNOSIS — Z79899 Other long term (current) drug therapy: Secondary | ICD-10-CM | POA: Diagnosis not present

## 2014-02-05 DIAGNOSIS — R739 Hyperglycemia, unspecified: Secondary | ICD-10-CM

## 2014-02-05 LAB — CBC WITH DIFFERENTIAL/PLATELET
Basophils Absolute: 0.1 10*3/uL (ref 0.0–0.1)
Basophils Relative: 1 % (ref 0–1)
EOS ABS: 0.6 10*3/uL (ref 0.0–0.7)
EOS PCT: 7 % — AB (ref 0–5)
HEMATOCRIT: 46 % (ref 39.0–52.0)
HEMOGLOBIN: 16.3 g/dL (ref 13.0–17.0)
LYMPHS ABS: 3.2 10*3/uL (ref 0.7–4.0)
LYMPHS PCT: 39 % (ref 12–46)
MCH: 29.5 pg (ref 26.0–34.0)
MCHC: 35.4 g/dL (ref 30.0–36.0)
MCV: 83.2 fL (ref 78.0–100.0)
MONO ABS: 0.4 10*3/uL (ref 0.1–1.0)
MONOS PCT: 4 % (ref 3–12)
Neutro Abs: 4 10*3/uL (ref 1.7–7.7)
Neutrophils Relative %: 49 % (ref 43–77)
Platelets: 195 10*3/uL (ref 150–400)
RBC: 5.53 MIL/uL (ref 4.22–5.81)
RDW: 12.8 % (ref 11.5–15.5)
WBC: 8.2 10*3/uL (ref 4.0–10.5)

## 2014-02-05 LAB — BLOOD GAS, VENOUS
Acid-Base Excess: 1.1 mmol/L (ref 0.0–2.0)
BICARBONATE: 25.9 meq/L — AB (ref 20.0–24.0)
O2 SAT: 49.8 %
PCO2 VEN: 47.1 mmHg (ref 45.0–50.0)
PH VEN: 7.36 — AB (ref 7.250–7.300)
Patient temperature: 37
TCO2: 22.7 mmol/L (ref 0–100)

## 2014-02-05 LAB — CBG MONITORING, ED
GLUCOSE-CAPILLARY: 258 mg/dL — AB (ref 70–99)
Glucose-Capillary: 423 mg/dL — ABNORMAL HIGH (ref 70–99)

## 2014-02-05 LAB — COMPREHENSIVE METABOLIC PANEL
ALT: 70 U/L — ABNORMAL HIGH (ref 0–53)
AST: 38 U/L — ABNORMAL HIGH (ref 0–37)
Albumin: 5 g/dL (ref 3.5–5.2)
Alkaline Phosphatase: 68 U/L (ref 39–117)
Anion gap: 17 — ABNORMAL HIGH (ref 5–15)
BILIRUBIN TOTAL: 0.6 mg/dL (ref 0.3–1.2)
BUN: 23 mg/dL (ref 6–23)
CALCIUM: 10.7 mg/dL — AB (ref 8.4–10.5)
CO2: 27 meq/L (ref 19–32)
Chloride: 90 mEq/L — ABNORMAL LOW (ref 96–112)
Creatinine, Ser: 1.37 mg/dL — ABNORMAL HIGH (ref 0.50–1.35)
GFR calc Af Amer: 72 mL/min — ABNORMAL LOW (ref >90)
GFR calc non Af Amer: 62 mL/min — ABNORMAL LOW (ref >90)
GLUCOSE: 438 mg/dL — AB (ref 70–99)
Potassium: 4.5 mEq/L (ref 3.7–5.3)
SODIUM: 134 meq/L — AB (ref 137–147)
TOTAL PROTEIN: 8.2 g/dL (ref 6.0–8.3)

## 2014-02-05 LAB — URINALYSIS, ROUTINE W REFLEX MICROSCOPIC
Bilirubin Urine: NEGATIVE
Hgb urine dipstick: NEGATIVE
LEUKOCYTES UA: NEGATIVE
Nitrite: NEGATIVE
PH: 5.5 (ref 5.0–8.0)
Protein, ur: NEGATIVE mg/dL
Specific Gravity, Urine: 1.01 (ref 1.005–1.030)
Urobilinogen, UA: 0.2 mg/dL (ref 0.0–1.0)

## 2014-02-05 LAB — URINE MICROSCOPIC-ADD ON

## 2014-02-05 MED ORDER — ONDANSETRON HCL 4 MG/2ML IJ SOLN
4.0000 mg | Freq: Once | INTRAMUSCULAR | Status: AC
  Administered 2014-02-05: 4 mg via INTRAVENOUS

## 2014-02-05 MED ORDER — SODIUM CHLORIDE 0.9 % IV BOLUS (SEPSIS)
1000.0000 mL | Freq: Once | INTRAVENOUS | Status: AC
  Administered 2014-02-05: 1000 mL via INTRAVENOUS

## 2014-02-05 MED ORDER — METFORMIN HCL 500 MG PO TABS: 1000.0000 mg | ORAL_TABLET | Freq: Two times a day (BID) | ORAL | Status: DC

## 2014-02-05 MED ORDER — ONDANSETRON HCL 4 MG/2ML IJ SOLN
4.0000 mg | Freq: Once | INTRAMUSCULAR | Status: DC
  Filled 2014-02-05: qty 2

## 2014-02-05 MED ORDER — INSULIN ASPART 100 UNIT/ML ~~LOC~~ SOLN
10.0000 [IU] | Freq: Once | SUBCUTANEOUS | Status: AC
  Administered 2014-02-05: 10 [IU] via INTRAVENOUS
  Filled 2014-02-05: qty 1

## 2014-02-05 NOTE — ED Provider Notes (Signed)
CSN: [id number]     Arrival date & time 02/05/14  1634 History   First MD Initiated Contact with Patient 02/05/14 1643     Chief Complaint  Patient presents with  . Hyperglycemia      HPI  Patient presents with concern of weakness. Weakness is generalized, nonfocal.  There is no associated pain, nor syncope, confusion. There is associated nausea. Patient notes that he is taking all medication as directed, including recently changed metformin dosing.  No clear alleviating factors.  Past Medical History  Diagnosis Date  . Hypertension   . Acid reflux   . Gout   . Poor circulation   . Tremors of nervous system   . Diabetes mellitus without complication   . Hemorrhoids     s/p right posterior, left lateral, right anterior banding. Last banding procedure Jan 2015.    Past Surgical History  Procedure Laterality Date  . None    . Colonoscopy N/A 12/16/2012    Dr. Jena Gaussourk- internal hemorrhoids otherwise normal. Screening in 2024.    Family History  Problem Relation Age of Onset  . Hypertension Mother   . Arthritis    . Colon cancer Neg Hx    History  Substance Use Topics  . Smoking status: Current Every Day Smoker -- 0.25 packs/day for 30 years    Types: Cigarettes  . Smokeless tobacco: Never Used     Comment: Smokes 6-7 cigarettes daily  . Alcohol Use: Yes     Comment: occ. beer    Review of Systems  Constitutional:       Per HPI, otherwise negative  HENT:       Per HPI, otherwise negative  Respiratory:       Per HPI, otherwise negative  Cardiovascular:       Per HPI, otherwise negative  Gastrointestinal: Positive for nausea. Negative for vomiting.  Endocrine:       Negative aside from HPI  Genitourinary:       Neg aside from HPI   Musculoskeletal:       Per HPI, otherwise negative  Skin: Negative.   Neurological: Positive for weakness. Negative for syncope.      Allergies  Review of patient's allergies indicates no known allergies.  Home Medications    Prior to Admission medications   Medication Sig Start Date End Date Taking? Authorizing Provider  allopurinol (ZYLOPRIM) 300 MG tablet Take 300 mg by mouth every morning.     Historical Provider, MD  atorvastatin (LIPITOR) 20 MG tablet Take 20 mg by mouth daily.    Historical Provider, MD  colchicine 0.6 MG tablet Take 0.6 mg by mouth every morning.     Historical Provider, MD  gabapentin (NEURONTIN) 300 MG capsule Take 600 mg by mouth 2 (two) times daily.    Historical Provider, MD  indomethacin (INDOCIN) 25 MG capsule Take 25 mg by mouth 2 (two) times daily with a meal.      Historical Provider, MD  lisinopril-hydrochlorothiazide (PRINZIDE,ZESTORETIC) 10-12.5 MG per tablet Take 1 tablet by mouth every morning.     Historical Provider, MD  loratadine (CLARITIN) 10 MG tablet Take 10 mg by mouth daily.    Historical Provider, MD  metFORMIN (GLUCOPHAGE) 500 MG tablet Take 1000 mg in the AM and 500 mg in the evening 12/15/13   Linwood DibblesJon Knapp, MD  omeprazole (PRILOSEC) 20 MG capsule Take 20 mg by mouth 2 (two) times daily.     Historical Provider, MD   BP 123/77  Pulse 92  Temp(Src) 97.4 F (36.3 C) (Oral)  Resp 18  Ht 6\' 4"  (1.93 m)  Wt 300 lb (136.079 kg)  BMI 36.53 kg/m2  SpO2 99% Physical Exam  Nursing note and vitals reviewed. Constitutional: He is oriented to person, place, and time. He appears well-developed. No distress.  HENT:  Head: Normocephalic and atraumatic.  Eyes: Conjunctivae and EOM are normal.  Cardiovascular: Normal rate and regular rhythm.   Pulmonary/Chest: Effort normal. No stridor. No respiratory distress.  Abdominal: He exhibits no distension.  Musculoskeletal: He exhibits no edema.  Neurological: He is alert and oriented to person, place, and time. No cranial nerve deficit. He exhibits normal muscle tone. Coordination normal.  Skin: Skin is warm and dry.  Psychiatric: He has a normal mood and affect.    ED Course  Procedures (including critical care time) Labs  Review Labs Reviewed  CBG MONITORING, ED - Abnormal; Notable for the following:    Glucose-Capillary 423 (*)    All other components within normal limits  CBC WITH DIFFERENTIAL  COMPREHENSIVE METABOLIC PANEL  URINALYSIS, ROUTINE W REFLEX MICROSCOPIC  BLOOD GAS, VENOUS    6:22 PM Patient was nauseous w emesis. He improved w zofran.  Labs initially notable for AG 17, hyperglycemia   8:52 PM On exam the patient is awake and alert, eating, drinking, no complaints. Patient's blood glucose is now less than 300.  Patient is concerned of his right great toe.  On exam, the toenail is long, with  trace erythema about the lateral ridge.  No evidence for infection, drainage, discharge.  We discussed hygiene  MDM   Patient presents with generalized weakness, some be hyperglycemic, with a borderline anion gap. With fluid resuscitation, insulin, patient improved substantially, was tolerant of oral intake. With no evidence for distress, patient had adjustment of his diabetic medication, will follow up with primary care in the next 3 days for long-term medication adjustments.    Gerhard Munchobert Deseray Daponte, MD 02/05/14 2053

## 2014-02-05 NOTE — Discharge Instructions (Signed)
As discussed, it is important to follow-up with your physician within the next 3 days for long-term adjustment of your medication.  In regards to your toe, please use Epsom salt baths, 3 times daily until the wound has healed. Return here for concerning changes in your condition. Hyperglycemia Hyperglycemia occurs when the glucose (sugar) in your blood is too high. Hyperglycemia can happen for many reasons, but it most often happens to people who do not know they have diabetes or are not managing their diabetes properly.  CAUSES  Whether you have diabetes or not, there are other causes of hyperglycemia. Hyperglycemia can occur when you have diabetes, but it can also occur in other situations that you might not be as aware of, such as: Diabetes  If you have diabetes and are having problems controlling your blood glucose, hyperglycemia could occur because of some of the following reasons:  Not following your meal plan.  Not taking your diabetes medications or not taking it properly.  Exercising less or doing less activity than you normally do.  Being sick. Pre-diabetes  This cannot be ignored. Before people develop Type 2 diabetes, they almost always have "pre-diabetes." This is when your blood glucose levels are higher than normal, but not yet high enough to be diagnosed as diabetes. Research has shown that some long-term damage to the body, especially the heart and circulatory system, may already be occurring during pre-diabetes. If you take action to manage your blood glucose when you have pre-diabetes, you may delay or prevent Type 2 diabetes from developing. Stress  If you have diabetes, you may be "diet" controlled or on oral medications or insulin to control your diabetes. However, you may find that your blood glucose is higher than usual in the hospital whether you have diabetes or not. This is often referred to as "stress hyperglycemia." Stress can elevate your blood glucose. This  happens because of hormones put out by the body during times of stress. If stress has been the cause of your high blood glucose, it can be followed regularly by your caregiver. That way he/she can make sure your hyperglycemia does not continue to get worse or progress to diabetes. Steroids  Steroids are medications that act on the infection fighting system (immune system) to block inflammation or infection. One side effect can be a rise in blood glucose. Most people can produce enough extra insulin to allow for this rise, but for those who cannot, steroids make blood glucose levels go even higher. It is not unusual for steroid treatments to "uncover" diabetes that is developing. It is not always possible to determine if the hyperglycemia will go away after the steroids are stopped. A special blood test called an A1c is sometimes done to determine if your blood glucose was elevated before the steroids were started. SYMPTOMS  Thirsty.  Frequent urination.  Dry mouth.  Blurred vision.  Tired or fatigue.  Weakness.  Sleepy.  Tingling in feet or leg. DIAGNOSIS  Diagnosis is made by monitoring blood glucose in one or all of the following ways:  A1c test. This is a chemical found in your blood.  Fingerstick blood glucose monitoring.  Laboratory results. TREATMENT  First, knowing the cause of the hyperglycemia is important before the hyperglycemia can be treated. Treatment may include, but is not be limited to:  Education.  Change or adjustment in medications.  Change or adjustment in meal plan.  Treatment for an illness, infection, etc.  More frequent blood glucose monitoring.  Change  in exercise plan.  Decreasing or stopping steroids.  Lifestyle changes. HOME CARE INSTRUCTIONS   Test your blood glucose as directed.  Exercise regularly. Your caregiver will give you instructions about exercise. Pre-diabetes or diabetes which comes on with stress is helped by  exercising.  Eat wholesome, balanced meals. Eat often and at regular, fixed times. Your caregiver or nutritionist will give you a meal plan to guide your sugar intake.  Being at an ideal weight is important. If needed, losing as little as 10 to 15 pounds may help improve blood glucose levels. SEEK MEDICAL CARE IF:   You have questions about medicine, activity, or diet.  You continue to have symptoms (problems such as increased thirst, urination, or weight gain). SEEK IMMEDIATE MEDICAL CARE IF:   You are vomiting or have diarrhea.  Your breath smells fruity.  You are breathing faster or slower.  You are very sleepy or incoherent.  You have numbness, tingling, or pain in your feet or hands.  You have chest pain.  Your symptoms get worse even though you have been following your caregiver's orders.  If you have any other questions or concerns. Document Released: 09/20/2000 Document Revised: 06/19/2011 Document Reviewed: 07/24/2011 [facility] Patient Information 2015 [place], [place]. This information is not intended to replace advice given to you by your health care provider. Make sure you discuss any questions you have with your health care provider.

## 2014-02-05 NOTE — ED Notes (Signed)
Pt says he feels dizzy and weak,  cbg was over 400 this am at home.  Alert, ambulatory into triage.  No NVD.

## 2014-02-20 ENCOUNTER — Emergency Department (HOSPITAL_COMMUNITY)
Admission: EM | Admit: 2014-02-20 | Discharge: 2014-02-20 | Disposition: A | Discharge status: Home / Self Care | Payer: Medicaid Other | Attending: Emergency Medicine | Admitting: Emergency Medicine

## 2014-02-20 ENCOUNTER — Encounter (HOSPITAL_COMMUNITY): Payer: Self-pay | Admitting: Emergency Medicine

## 2014-02-20 DIAGNOSIS — K219 Gastro-esophageal reflux disease without esophagitis: Secondary | ICD-10-CM | POA: Diagnosis not present

## 2014-02-20 DIAGNOSIS — Z791 Long term (current) use of non-steroidal anti-inflammatories (NSAID): Secondary | ICD-10-CM | POA: Diagnosis not present

## 2014-02-20 DIAGNOSIS — Z720 Tobacco use: Secondary | ICD-10-CM | POA: Insufficient documentation

## 2014-02-20 DIAGNOSIS — G40909 Epilepsy, unspecified, not intractable, without status epilepticus: Secondary | ICD-10-CM | POA: Diagnosis not present

## 2014-02-20 DIAGNOSIS — I10 Essential (primary) hypertension: Secondary | ICD-10-CM | POA: Insufficient documentation

## 2014-02-20 DIAGNOSIS — E119 Type 2 diabetes mellitus without complications: Secondary | ICD-10-CM | POA: Insufficient documentation

## 2014-02-20 DIAGNOSIS — M109 Gout, unspecified: Secondary | ICD-10-CM | POA: Insufficient documentation

## 2014-02-20 DIAGNOSIS — Z794 Long term (current) use of insulin: Secondary | ICD-10-CM | POA: Diagnosis not present

## 2014-02-20 DIAGNOSIS — M79645 Pain in left finger(s): Secondary | ICD-10-CM | POA: Diagnosis present

## 2014-02-20 DIAGNOSIS — L03012 Cellulitis of left finger: Secondary | ICD-10-CM | POA: Diagnosis not present

## 2014-02-20 DIAGNOSIS — Z79899 Other long term (current) drug therapy: Secondary | ICD-10-CM | POA: Diagnosis not present

## 2014-02-20 MED ORDER — HYDROCODONE-ACETAMINOPHEN 5-325 MG PO TABS: 1.0000 | ORAL_TABLET | ORAL | Status: DC | PRN

## 2014-02-20 MED ORDER — LIDOCAINE HCL (PF) 2 % IJ SOLN
2.0000 mL | Freq: Once | INTRAMUSCULAR | Status: DC
  Filled 2014-02-20: qty 10

## 2014-02-20 MED ORDER — SULFAMETHOXAZOLE-TRIMETHOPRIM 800-160 MG PO TABS: 1.0000 | ORAL_TABLET | Freq: Two times a day (BID) | ORAL | Status: DC

## 2014-02-20 NOTE — ED Notes (Signed)
Patient with no complaints at this time. Respirations even and unlabored. Skin warm/dry. Discharge instructions reviewed with patient at this time. Patient given opportunity to voice concerns/ask questions. Patient discharged at this time and left Emergency Department with steady gait.   

## 2014-02-20 NOTE — ED Notes (Addendum)
Pt reports left index finger swelling x1 week. Pt denies any known injury. Mild swelling noted to fingertip. Cap refil wnl. Distal pulses intact. No obvious deformity noted.no drainage or redness noted to site.

## 2014-02-20 NOTE — ED Provider Notes (Signed)
CSN: [id number]     Arrival date & time 02/20/14  1142 History   First MD Initiated Contact with Patient 02/20/14 1359     Chief Complaint  Patient presents with  . Finger Injury     (Consider location/radiation/quality/duration/timing/severity/associated sxs/prior Treatment) The history is provided by the patient.   Daniel Nicholson is a 43 y.o. male presenting with pain and swelling of his distal left index finger for the past week.  He has a history of pulling a "hangnail" along his cuticle edge which then became more sore, swollen and painful.  He squeezed the area and obtained a small amount of pus several days ago, but it has become more swollen and painful since.  He denies fevers or chills, swelling proximal to the site of infection, no nausea, vomiting or other complaint.  He has had no medicine for this prior to arrival.     Past Medical History  Diagnosis Date  . Hypertension   . Acid reflux   . Gout   . Poor circulation   . Tremors of nervous system   . Diabetes mellitus without complication   . Hemorrhoids     s/p right posterior, left lateral, right anterior banding. Last banding procedure Jan 2015.    Past Surgical History  Procedure Laterality Date  . None    . Colonoscopy N/A 12/16/2012    Dr. Jena Gaussourk- internal hemorrhoids otherwise normal. Screening in 2024.    Family History  Problem Relation Age of Onset  . Hypertension Mother   . Arthritis    . Colon cancer Neg Hx    History  Substance Use Topics  . Smoking status: Current Every Day Smoker -- 0.25 packs/day for 30 years    Types: Cigarettes  . Smokeless tobacco: Never Used     Comment: Smokes 6-7 cigarettes daily  . Alcohol Use: Yes     Comment: occ. beer    Review of Systems  Constitutional: Negative for fever and chills.  Respiratory: Negative for shortness of breath and wheezing.   Musculoskeletal: Positive for arthralgias.  Skin: Positive for color change and wound.  Neurological:  Negative for numbness.      Allergies  Review of patient's allergies indicates no known allergies.  Home Medications   Prior to Admission medications   Medication Sig Start Date End Date Taking? Authorizing Provider  allopurinol (ZYLOPRIM) 300 MG tablet Take 300 mg by mouth every morning.    Yes Historical Provider, MD  atorvastatin (LIPITOR) 20 MG tablet Take 20 mg by mouth daily.   Yes Historical Provider, MD  colchicine 0.6 MG tablet Take 0.6 mg by mouth every morning.    Yes Historical Provider, MD  gabapentin (NEURONTIN) 300 MG capsule Take 600 mg by mouth 2 (two) times daily.   Yes Historical Provider, MD  indomethacin (INDOCIN) 25 MG capsule Take 25 mg by mouth 2 (two) times daily with a meal.     Yes Historical Provider, MD  insulin detemir (LEVEMIR) 100 UNIT/ML injection Inject 30 Units into the skin at bedtime.   Yes Historical Provider, MD  lisinopril-hydrochlorothiazide (PRINZIDE,ZESTORETIC) 10-12.5 MG per tablet Take 1 tablet by mouth every morning.    Yes Historical Provider, MD  loratadine (CLARITIN) 10 MG tablet Take 10 mg by mouth daily.   Yes Historical Provider, MD  metFORMIN (GLUCOPHAGE) 500 MG tablet Take 2 tablets (1,000 mg total) by mouth 2 (two) times daily. Patient taking 2 tablets in the morning and 1 tablet in the evening.  02/05/14  Yes Gerhard Munchobert Lockwood, MD  omeprazole (PRILOSEC) 20 MG capsule Take 20 mg by mouth 2 (two) times daily.    Yes Historical Provider, MD  HYDROcodone-acetaminophen (NORCO/VICODIN) 5-325 MG per tablet Take 1 tablet by mouth every 4 (four) hours as needed. 02/20/14   Burgess AmorJulie Karrie Fluellen, PA-C  sulfamethoxazole-trimethoprim (SEPTRA DS) 800-160 MG per tablet Take 1 tablet by mouth every 12 (twelve) hours. 02/20/14   Burgess AmorJulie Leshawn Straka, PA-C   BP 130/82 mmHg  Pulse 98  Temp(Src) 98 F (36.7 C) (Oral)  Resp 18  Ht 6\' 4"  (1.93 m)  Wt 295 lb (133.811 kg)  BMI 35.92 kg/m2  SpO2 100% Physical Exam  Constitutional: He appears well-developed and  well-nourished.  HENT:  Head: Atraumatic.  Neck: Normal range of motion.  Cardiovascular:  Pulses equal bilaterally  Musculoskeletal: He exhibits edema and tenderness.  Neurological: He is alert. He has normal strength. He displays normal reflexes. No sensory deficit.  Skin: Skin is warm and dry. There is erythema.  Localized erythema and edema left index finger along cuticle nail fold.  Central white/yellow discoloration.  No pointing or drainage.  Distal cap refill less than 2 seconds.  No red streaking.  Psychiatric: He has a normal mood and affect.    ED Course  Procedures (including critical care time)  INCISION AND DRAINAGE Performed by: Burgess AmorIDOL, Danylah Holden Consent: Verbal consent obtained. Risks and benefits: risks, benefits and alternatives were discussed Type: abscess  Body area: left index finger  Anesthesia:digital block  Incision was made with a scalpel. No skin infection.  Cuticle was lifted from nail plate using #16#11 blade  Local anesthetic: lidocaine 2% without epinephrine  Anesthetic total: 2 ml  Complexity: simple  Blunt dissection to break up loculations  Drainage: purulent  Drainage amount: small  Packing material: no packing  Patient tolerance: Patient tolerated the procedure well with no immediate complications.    Labs Review Labs Reviewed - No data to display  Imaging Review No results found.   EKG Interpretation None      MDM   Final diagnoses:  Paronychia of finger, left    Pt prescribed bactrim, hydrocodone. Encouraged warm epsom salt soaks tid.  F/u with pcp for any worsened sx.    Burgess AmorJulie Osama Coleson, PA-C 02/20/14 2123  Layla MawKristen N Ward, DO 02/21/14 [id number]

## 2014-02-20 NOTE — Discharge Instructions (Signed)
Paronychia Paronychia is an inflammatory reaction involving the folds of the skin surrounding the fingernail. This is commonly caused by an infection in the skin around a nail. The most common cause of paronychia is frequent wetting of the hands (as seen with bartenders, food servers, nurses or others who wet their hands). This makes the skin around the fingernail susceptible to infection by bacteria (germs) or fungus. Other predisposing factors are:  Aggressive manicuring.  Nail biting.  Thumb sucking. The most common cause is a staphylococcal (a type of germ) infection, or a fungal (Candida) infection. When caused by a germ, it usually comes on suddenly with redness, swelling, pus and is often painful. It may get under the nail and form an abscess (collection of pus), or form an abscess around the nail. If the nail itself is infected with a fungus, the treatment is usually prolonged and may require oral medicine for up to one year. Your caregiver will determine the length of time treatment is required. The paronychia caused by bacteria (germs) may largely be avoided by not pulling on hangnails or picking at cuticles. When the infection occurs at the tips of the finger it is called felon. When the cause of paronychia is from the herpes simplex virus (HSV) it is called herpetic whitlow. TREATMENT  When an abscess is present treatment is often incision and drainage. This means that the abscess must be cut open so the pus can get out. When this is done, the following home care instructions should be followed. HOME CARE INSTRUCTIONS   It is important to keep the affected fingers very dry. Rubber or plastic gloves over cotton gloves should be used whenever the hand must be placed in water.  Keep wound clean, dry and dressed as suggested by your caregiver between warm soaks or warm compresses.  Soak in warm water for fifteen to twenty minutes three to four times per day for bacterial infections. For  bacterial (germ) infections take antibiotics (medicine which kill germs) as directed and finish the prescription, even if the problem appears to be solved before the medicine is gone.  Only take over-the-counter or prescription medicines for pain, discomfort, or fever as directed by your caregiver. SEEK IMMEDIATE MEDICAL CARE IF:  You have redness, swelling, or increasing pain in the wound.  You notice pus coming from the wound.  You have a fever.  You notice a bad smell coming from the wound or dressing. Document Released: 09/20/2000 Document Revised: 06/19/2011 Document Reviewed: 05/22/2008 [facility] Patient Information 2015 [place]Care, [place]. This information is not intended to replace advice given to you by your health care provider. Make sure you discuss any questions you have with your health care provider.

## 2014-02-24 ENCOUNTER — Encounter: Payer: Self-pay | Admitting: *Deleted

## 2014-02-24 ENCOUNTER — Other Ambulatory Visit: Payer: Self-pay | Admitting: *Deleted

## 2014-02-24 DIAGNOSIS — K760 Fatty (change of) liver, not elsewhere classified: Secondary | ICD-10-CM

## 2014-03-10 LAB — HEPATIC FUNCTION PANEL
ALT: 71 U/L — AB (ref 0–53)
AST: 42 U/L — AB (ref 0–37)
Albumin: 4.3 g/dL (ref 3.5–5.2)
Alkaline Phosphatase: 38 U/L — ABNORMAL LOW (ref 39–117)
BILIRUBIN DIRECT: 0.1 mg/dL (ref 0.0–0.3)
Indirect Bilirubin: 0.3 mg/dL (ref 0.2–1.2)
Total Bilirubin: 0.4 mg/dL (ref 0.2–1.2)
Total Protein: 6.2 g/dL (ref 6.0–8.3)

## 2014-03-24 NOTE — Progress Notes (Signed)
Quick Note:  Persistent mildly elevated transaminases. Is he still drinking ETOH? ______

## 2014-05-14 NOTE — Progress Notes (Signed)
Quick Note:  Noted. Instructions for fatty liver: Recommend 1-2# weight loss per week until ideal body weight through exercise & diet. Low fat/cholesterol diet.  Avoid sweets, sodas, fruit juices, sweetened beverages like tea, etc. Gradually increase exercise from 15 min daily up to 1 hr per day 5 days/week. Limit alcohol use.  Repeat HFP in 3 months. ______

## 2014-05-21 ENCOUNTER — Other Ambulatory Visit: Payer: Self-pay | Admitting: Gastroenterology

## 2014-05-21 DIAGNOSIS — K760 Fatty (change of) liver, not elsewhere classified: Secondary | ICD-10-CM

## 2014-07-01 ENCOUNTER — Other Ambulatory Visit: Payer: Self-pay

## 2014-07-01 DIAGNOSIS — K760 Fatty (change of) liver, not elsewhere classified: Secondary | ICD-10-CM

## 2014-07-14 LAB — HEPATIC FUNCTION PANEL
ALK PHOS: 35 U/L — AB (ref 39–117)
ALT: 52 U/L (ref 0–53)
AST: 37 U/L (ref 0–37)
Albumin: 4.6 g/dL (ref 3.5–5.2)
BILIRUBIN TOTAL: 0.5 mg/dL (ref 0.2–1.2)
Bilirubin, Direct: 0.1 mg/dL (ref 0.0–0.3)
Indirect Bilirubin: 0.4 mg/dL (ref 0.2–1.2)
Total Protein: 6.5 g/dL (ref 6.0–8.3)

## 2014-07-28 NOTE — Progress Notes (Signed)
Quick Note:  LFTs now normal! Let's have a routine office visit, non-urgent. Reason: fatty liver ______

## 2014-07-30 ENCOUNTER — Encounter: Payer: Self-pay | Admitting: Internal Medicine

## 2014-07-30 NOTE — Progress Notes (Signed)
APPOINTMENT MADE AND LETTER SENT °

## 2014-08-20 ENCOUNTER — Ambulatory Visit (INDEPENDENT_AMBULATORY_CARE_PROVIDER_SITE_OTHER): Payer: Medicaid Other | Admitting: Gastroenterology

## 2014-08-20 ENCOUNTER — Encounter: Payer: Self-pay | Admitting: Gastroenterology

## 2014-08-20 VITALS — BP 130/92 | HR 80 | Temp 98.2°F | Ht 76.0 in | Wt 296.0 lb

## 2014-08-20 DIAGNOSIS — K760 Fatty (change of) liver, not elsewhere classified: Secondary | ICD-10-CM

## 2014-08-20 NOTE — Progress Notes (Signed)
Referring Provider: Avon GullyFanta, Tesfaye, MD Primary Care Physician:  [facility]  Chief Complaint  Patient presents with  . fatty liver    HPI:   Daniel Nicholson is a 44 y.o. male presenting today with a history of internal hemorrhoids s/p right posterior, left lateral, right anterior banding. Last banding procedure Jan 2015. Denies any lower GI symptoms. No upper GI symptoms. History of fatty liver, with most recent transaminases virtually normalized. Walks daily. Attempting low fat diet. 3 lbs down since last year.   Past Medical History  Diagnosis Date  . Hypertension   . Acid reflux   . Gout   . Poor circulation   . Tremors of nervous system   . Diabetes mellitus without complication   . Hemorrhoids     s/p right posterior, left lateral, right anterior banding. Last banding procedure Jan 2015.     Past Surgical History  Procedure Laterality Date  . None    . Colonoscopy N/A 12/16/2012    Dr. Jena Gaussourk- internal hemorrhoids otherwise normal. Screening in 2024.     Current Outpatient Prescriptions  Medication Sig Dispense Refill  . allopurinol (ZYLOPRIM) 300 MG tablet Take 300 mg by mouth every morning.     [name]. atorvastatin (LIPITOR) 20 MG tablet Take 20 mg by mouth daily.    . colchicine 0.6 MG tablet Take 0.6 mg by mouth every morning.     . gabapentin (NEURONTIN) 300 MG capsule Take 600 mg by mouth 2 (two) times daily.    . indomethacin (INDOCIN) 25 MG capsule Take 25 mg by mouth 2 (two) times daily with a meal.      . insulin detemir (LEVEMIR) 100 UNIT/ML injection Inject 50 Units into the skin at bedtime.     [name]. lisinopril-hydrochlorothiazide (PRINZIDE,ZESTORETIC) 10-12.5 MG per tablet Take 1 tablet by mouth every morning.     . loratadine (CLARITIN) 10 MG tablet Take 10 mg by mouth daily.    . metFORMIN (GLUCOPHAGE) 500 MG tablet Take 2 tablets (1,000 mg total) by mouth 2 (two) times daily. Patient taking 2 tablets in the morning and 1 tablet in the  evening. 60 tablet 0  . omeprazole (PRILOSEC) 20 MG capsule Take 20 mg by mouth 2 (two) times daily.     [name]. HYDROcodone-acetaminophen (NORCO/VICODIN) 5-325 MG per tablet Take 1 tablet by mouth every 4 (four) hours as needed. (Patient not taking: Reported on 08/20/2014) 20 tablet 0  . sulfamethoxazole-trimethoprim (SEPTRA DS) 800-160 MG per tablet Take 1 tablet by mouth every 12 (twelve) hours. (Patient not taking: Reported on 08/20/2014) 20 tablet 0   No current facility-administered medications for this visit.    Allergies as of 08/20/2014  . (No Known Allergies)    Family History  Problem Relation Age of Onset  . Hypertension Mother   . Arthritis    . Colon cancer Neg Hx     History   Social History  . Marital Status: Divorced    Spouse Name: N/A  . Number of Children: N/A  . Years of Education: 12th grade   Occupational History  . unemployed    Social History Main Topics  . Smoking status: Current Every Day Smoker -- 0.25 packs/day for 30 years    Types: Cigarettes  . Smokeless tobacco: Never Used     Comment: Smokes 6-7 cigarettes daily  . Alcohol Use: 0.0 oz/week    0 Standard drinks or equivalent per week     Comment: occ.  beer but rare   . Drug Use: No  . Sexual Activity: Yes    Birth Control/ Protection: Condom   Other Topics Concern  . None   Social History Narrative    Review of Systems: Negative unless mentioned in HPI.   Physical Exam: BP 130/92 mmHg  Pulse 80  Temp(Src) 98.2 F (36.8 C)  Ht 6\' 4"  (1.93 m)  Wt 296 lb (134.265 kg)  BMI 36.05 kg/m2 General:   Alert and oriented. No distress noted. Pleasant and cooperative.  Head:  Normocephalic and atraumatic. Eyes:  Conjuctiva clear without scleral icterus. Heart:  S1, S2 present without murmurs, rubs, or gallops. Regular rate and rhythm. Abdomen:  +BS, soft, non-tender and non-distended. Palpable liver margin several fingerbreadths below right costal margin Msk:  Symmetrical without gross  deformities. Normal posture. Extremities:  Without edema. Neurologic:  Alert and  oriented x4;  grossly normal neurologically. Skin:  Intact without significant lesions or rashes. Psych:  Alert and cooperative. Normal mood and affect.  Lab Results  Component Value Date   ALT 52 07/13/2014   AST 37 07/13/2014   ALKPHOS 35* 07/13/2014   BILITOT 0.5 07/13/2014

## 2014-08-20 NOTE — Patient Instructions (Addendum)
I would like to check your liver numbers again in 6 months.   We will see you physically back in 1 year.   Please review the handout.   Fatty Liver Fatty liver is the accumulation of fat in liver cells. It is also called hepatosteatosis or steatohepatitis. It is normal for your liver to contain some fat. If fat is more than 5 to 10% of your liver's weight, you have fatty liver.  There are often no symptoms (problems) for years while damage is still occurring. People often learn about their fatty liver when they have medical tests for other reasons. Fat can damage your liver for years or even decades without causing problems. When it becomes severe, it can cause fatigue, weight loss, weakness, and confusion. This makes you more likely to develop more serious liver problems. The liver is the largest organ in the body. It does a lot of work and often gives no warning signs when it is sick until late in a disease. The liver has many important jobs including:  Breaking down foods.  Storing vitamins, iron, and other minerals.  Making proteins.  Making bile for food digestion.  Breaking down many products including medications, alcohol and some poisons. CAUSES  There are a number of different conditions, medications, and poisons that can cause a fatty liver. Eating too many calories causes fat to build up in the liver. Not processing and breaking fats down normally may also cause this. Certain conditions, such as obesity, diabetes, and high triglycerides also cause this. Most fatty liver patients tend to be middle-aged and over weight.  Some causes of fatty liver are:  Alcohol over consumption.  Malnutrition.  Steroid use.  Valproic acid toxicity.  Obesity.  Cushing's syndrome.  Poisons.  Tetracycline in high dosages.  Pregnancy.  Diabetes.  Hyperlipidemia.  Rapid weight loss. Some people develop fatty liver even having none of these conditions. SYMPTOMS  Fatty liver most  often causes no problems. This is called asymptomatic.  It can be diagnosed with blood tests and also by a liver biopsy.  It is one of the most common causes of minor elevations of liver enzymes on routine blood tests.  Specialized Imaging of the liver using ultrasound, CT (computed tomography) scan, or MRI (magnetic resonance imaging) can suggest a fatty liver but a biopsy is needed to confirm it.  A biopsy involves taking a small sample of liver tissue. This is done by using a needle. It is then looked at under a microscope by a specialist. TREATMENT  It is important to treat the cause. Simple fatty liver without a medical reason may not need treatment.  Weight loss, fat restriction, and exercise in overweight patients produces inconsistent results but is worth trying.  Fatty liver due to alcohol toxicity may not improve even with stopping drinking.  Good control of diabetes may reduce fatty liver.  Lower your triglycerides through diet, medication or both.  Eat a balanced, healthy diet.  Increase your physical activity.  Get regular checkups from a liver specialist.  There are no medical or surgical treatments for a fatty liver or NASH, but improving your diet and increasing your exercise may help prevent or reverse some of the damage. PROGNOSIS  Fatty liver may cause no damage or it can lead to an inflammation of the liver. This is, called steatohepatitis. When it is linked to alcohol abuse, it is called alcoholic steatohepatitis. It often is not linked to alcohol. It is then called nonalcoholic steatohepatitis, or  NASH. Over time the liver may become scarred and hardened. This condition is called cirrhosis. Cirrhosis is serious and may lead to liver failure or cancer. NASH is one of the leading causes of cirrhosis. About 10-20% of Americans have fatty liver and a smaller 2-5% has NASH. Document Released: 05/12/2005 Document Revised: 06/19/2011 Document Reviewed:  08/06/2013 [facility]Exit[facility] Patient Information 2015 [place]ExitCare, [place]LL[place]. This information is not intended to replace advice given to you by your health care provider. Make sure you discuss any questions you have with your health care provider.

## 2014-08-20 NOTE — Assessment & Plan Note (Signed)
44 year old male with known fatty liver and history of mild elevation of transaminases in past, most recently in April 2016 normalized. Discussed at length diet, exercise, behavior modification. Continues to limit alcohol to rare occasions. Serial LFTs recommended every 6 months, with return to clinic in 1 year for routine office visit. Next colonoscopy 2024.

## 2014-08-20 NOTE — Progress Notes (Signed)
cc'ed to pcp °

## 2014-08-20 NOTE — Addendum Note (Signed)
Addended by: Kennis CarinaFARRIS, Islam Eichinger L on: 08/20/2014 10:25 AM   Modules accepted: Medications

## 2014-08-26 ENCOUNTER — Other Ambulatory Visit: Payer: Self-pay

## 2014-08-26 DIAGNOSIS — K760 Fatty (change of) liver, not elsewhere classified: Secondary | ICD-10-CM

## 2014-11-07 ENCOUNTER — Encounter (HOSPITAL_COMMUNITY): Payer: Self-pay | Admitting: Emergency Medicine

## 2014-11-07 ENCOUNTER — Emergency Department (HOSPITAL_COMMUNITY)
Admission: EM | Admit: 2014-11-07 | Discharge: 2014-11-07 | Disposition: A | Discharge status: Home / Self Care | Payer: Medicaid Other | Attending: Emergency Medicine | Admitting: Emergency Medicine

## 2014-11-07 DIAGNOSIS — M62838 Other muscle spasm: Secondary | ICD-10-CM | POA: Diagnosis not present

## 2014-11-07 DIAGNOSIS — K219 Gastro-esophageal reflux disease without esophagitis: Secondary | ICD-10-CM | POA: Insufficient documentation

## 2014-11-07 DIAGNOSIS — Z794 Long term (current) use of insulin: Secondary | ICD-10-CM | POA: Diagnosis not present

## 2014-11-07 DIAGNOSIS — M542 Cervicalgia: Secondary | ICD-10-CM | POA: Diagnosis present

## 2014-11-07 DIAGNOSIS — E119 Type 2 diabetes mellitus without complications: Secondary | ICD-10-CM | POA: Insufficient documentation

## 2014-11-07 DIAGNOSIS — Z79899 Other long term (current) drug therapy: Secondary | ICD-10-CM | POA: Diagnosis not present

## 2014-11-07 DIAGNOSIS — M109 Gout, unspecified: Secondary | ICD-10-CM | POA: Diagnosis not present

## 2014-11-07 DIAGNOSIS — I10 Essential (primary) hypertension: Secondary | ICD-10-CM | POA: Diagnosis not present

## 2014-11-07 DIAGNOSIS — Z720 Tobacco use: Secondary | ICD-10-CM | POA: Diagnosis not present

## 2014-11-07 MED ORDER — METHOCARBAMOL 500 MG PO TABS
500.0000 mg | ORAL_TABLET | Freq: Two times a day (BID) | ORAL | Status: DC
Start: 2014-11-07 — End: 2016-12-09

## 2014-11-07 NOTE — Discharge Instructions (Signed)
Take the prescribed medication as directed. You may take Tylenol or Motrin with this if needed. Recommend heat therapy to help relax muscles --  warm soaks, hot showers, heating pad, warm compresses, etc.  Follow-up with  your primary care physician.  Return to the ED for new or worsening symptoms.

## 2014-11-07 NOTE — ED Provider Notes (Signed)
CSN: [id number]     Arrival date & time 11/07/14  1709 History   First MD Initiated Contact with Patient 11/07/14 1716     Chief Complaint  Patient presents with  . Neck Injury     (Consider location/radiation/quality/duration/timing/severity/associated sxs/prior Treatment) The history is provided by the patient and medical records.    44 year old male with history of hypertension, gout, diabetes, presenting to the ED for posterior neck pain. Patient states this has been intermittent over the past month. He states worse in the morning upon waking and gradually resolved throughout the day. He states his pain is like a "tension" in his muscles. He does admit to buying a new pillow top mattress approximately 1 month ago around the time his symptoms began. He denies any numbness, weakness, or paresthesias of his upper extremities. He denies any headache, fever, or chills. Patient has been taking hot showers which he states helped the pain in his neck, however pain returned about an hour after his shower. No medications tried.  VSS.  Past Medical History  Diagnosis Date  . Hypertension   . Acid reflux   . Gout   . Poor circulation   . Tremors of nervous system   . Diabetes mellitus without complication   . Hemorrhoids     s/p right posterior, left lateral, right anterior banding. Last banding procedure Jan 2015.    Past Surgical History  Procedure Laterality Date  . None    . Colonoscopy N/A 12/16/2012    Dr. Jena Gauss- internal hemorrhoids otherwise normal. Screening in 2024.   [name] Hemorrhoid banding     Family History  Problem Relation Age of Onset  . Hypertension Mother   . Arthritis    . Colon cancer Neg Hx    History  Substance Use Topics  . Smoking status: Current Every Day Smoker -- 0.50 packs/day for 30 years    Types: Cigarettes  . Smokeless tobacco: Never Used     Comment: Smokes 6-7 cigarettes daily  . Alcohol Use: 0.0 oz/week    0 Standard drinks or equivalent per week    Comment: occ. beer but rare     Review of Systems  Musculoskeletal: Positive for neck pain.  All other systems reviewed and are negative.     Allergies  Review of patient's allergies indicates no known allergies.  Home Medications   Prior to Admission medications   Medication Sig Start Date End Date Taking? Authorizing Provider  allopurinol (ZYLOPRIM) 300 MG tablet Take 300 mg by mouth every morning.     Historical Provider, MD  atorvastatin (LIPITOR) 20 MG tablet Take 20 mg by mouth daily.    Historical Provider, MD  colchicine 0.6 MG tablet Take 0.6 mg by mouth every morning.     Historical Provider, MD  gabapentin (NEURONTIN) 300 MG capsule Take 600 mg by mouth 2 (two) times daily.    Historical Provider, MD  hydrOXYzine (ATARAX/VISTARIL) 50 MG tablet Take 50 mg by mouth 3 (three) times daily as needed.    Historical Provider, MD  indomethacin (INDOCIN) 25 MG capsule Take 25 mg by mouth 2 (two) times daily with a meal.      Historical Provider, MD  insulin detemir (LEVEMIR) 100 UNIT/ML injection Inject 50 Units into the skin at bedtime.     Historical Provider, MD  lisinopril-hydrochlorothiazide (PRINZIDE,ZESTORETIC) 10-12.5 MG per tablet Take 1 tablet by mouth every morning.     Historical Provider, MD  loratadine (CLARITIN) 10 MG tablet Take 10  mg by mouth daily.    Historical Provider, MD  metFORMIN (GLUCOPHAGE) 500 MG tablet Take 2 tablets (1,000 mg total) by mouth 2 (two) times daily. Patient taking 2 tablets in the morning and 1 tablet in the evening. 02/05/14   Gerhard Munch, MD  omeprazole (PRILOSEC) 20 MG capsule Take 20 mg by mouth 2 (two) times daily.     Historical Provider, MD   BP 113/81 mmHg  Pulse 105  Temp(Src) 97.6 F (36.4 C) (Oral)  Resp 18  Ht  (1.93 m)  Wt 295 lb (133.811 kg)  BMI 35.92 kg/m2  SpO2 100%   Physical Exam  Constitutional: He is oriented to person, place, and time. He appears well-developed and well-nourished.  HENT:  Head:  Normocephalic and atraumatic.  Mouth/Throat: Oropharynx is clear and moist.  Eyes: Conjunctivae and EOM are normal. Pupils are equal, round, and reactive to light.  Neck: Normal range of motion and full passive range of motion without pain. Neck supple. Muscular tenderness present. No spinous process tenderness present. No rigidity. No edema, no erythema and normal range of motion present.    Tenderness of cervical paraspinal muscles bilaterally; no midline tenderness, deformity, or step-off; full range of motion maintained, no rigidity; normal strength and sensation of bilateral upper extremities; both arms are neurovascularly intact  Cardiovascular: Normal rate, regular rhythm and normal heart sounds.   Pulmonary/Chest: Effort normal and breath sounds normal. No respiratory distress. He has no wheezes.  Musculoskeletal: Normal range of motion.  Neurological: He is alert and oriented to person, place, and time.  Skin: Skin is warm and dry.  Psychiatric: He has a normal mood and affect.  Nursing note and vitals reviewed.   ED Course  Procedures (including critical care time) Labs Review Labs Reviewed - No data to display  Imaging Review No results found.   EKG Interpretation None      MDM   Final diagnoses:  Muscle spasms of neck   44 year old male with intermittent neck pain for the past month. Signs and symptoms consistent with muscle spasm, likely from his new mattress. No focal neurologic deficits to suggest central cord syndrome. No headache, nuchal rigidity, or fever to suggest meningitis. Patient will be discharged home with muscle relaxant, may use NSAIDs as well if needed. Also recommended continued heat therapy as this has been helping him.  He will follow with his primary care physician.  Discussed plan with patient, he/she acknowledged understanding and agreed with plan of care.  Return precautions given for new or worsening symptoms.  Garlon Hatchet, PA-C 11/07/14  1727  Mancel Bale, MD 11/07/14 [phone]

## 2014-11-07 NOTE — ED Notes (Signed)
Pt states that he has been having neck pain for over a month with no known injury.  States has been worse over the past 3 days.

## 2014-12-11 ENCOUNTER — Ambulatory Visit (HOSPITAL_COMMUNITY)
Admission: RE | Admit: 2014-12-11 | Discharge: 2014-12-11 | Disposition: A | Discharge status: Home / Self Care | Payer: Medicaid Other | Source: Ambulatory Visit | Attending: Nurse Practitioner | Admitting: Nurse Practitioner

## 2014-12-11 ENCOUNTER — Other Ambulatory Visit (HOSPITAL_COMMUNITY): Payer: Self-pay | Admitting: Nurse Practitioner

## 2014-12-11 DIAGNOSIS — M5032 Other cervical disc degeneration, mid-cervical region: Secondary | ICD-10-CM | POA: Insufficient documentation

## 2014-12-11 DIAGNOSIS — M542 Cervicalgia: Secondary | ICD-10-CM

## 2014-12-11 DIAGNOSIS — M47892 Other spondylosis, cervical region: Secondary | ICD-10-CM | POA: Insufficient documentation

## 2015-01-19 ENCOUNTER — Other Ambulatory Visit: Payer: Self-pay

## 2015-01-19 DIAGNOSIS — K760 Fatty (change of) liver, not elsewhere classified: Secondary | ICD-10-CM

## 2015-01-26 LAB — HEPATIC FUNCTION PANEL
ALBUMIN: 4.4 g/dL (ref 3.6–5.1)
ALK PHOS: 33 U/L — AB (ref 40–115)
ALT: 23 U/L (ref 9–46)
AST: 21 U/L (ref 10–40)
BILIRUBIN TOTAL: 0.5 mg/dL (ref 0.2–1.2)
Bilirubin, Direct: 0.1 mg/dL (ref <=0.2)
Indirect Bilirubin: 0.4 mg/dL (ref 0.2–1.2)
TOTAL PROTEIN: 6.8 g/dL (ref 6.1–8.1)

## 2015-01-28 ENCOUNTER — Emergency Department (HOSPITAL_COMMUNITY)
Admission: EM | Admit: 2015-01-28 | Discharge: 2015-01-28 | Disposition: A | Discharge status: Home / Self Care | Payer: Medicaid Other | Attending: Emergency Medicine | Admitting: Emergency Medicine

## 2015-01-28 ENCOUNTER — Encounter (HOSPITAL_COMMUNITY): Payer: Self-pay | Admitting: *Deleted

## 2015-01-28 DIAGNOSIS — M109 Gout, unspecified: Secondary | ICD-10-CM | POA: Diagnosis not present

## 2015-01-28 DIAGNOSIS — Z79899 Other long term (current) drug therapy: Secondary | ICD-10-CM | POA: Insufficient documentation

## 2015-01-28 DIAGNOSIS — E119 Type 2 diabetes mellitus without complications: Secondary | ICD-10-CM | POA: Diagnosis not present

## 2015-01-28 DIAGNOSIS — Z720 Tobacco use: Secondary | ICD-10-CM | POA: Insufficient documentation

## 2015-01-28 DIAGNOSIS — K219 Gastro-esophageal reflux disease without esophagitis: Secondary | ICD-10-CM | POA: Diagnosis not present

## 2015-01-28 DIAGNOSIS — R21 Rash and other nonspecific skin eruption: Secondary | ICD-10-CM | POA: Insufficient documentation

## 2015-01-28 DIAGNOSIS — Z791 Long term (current) use of non-steroidal anti-inflammatories (NSAID): Secondary | ICD-10-CM | POA: Diagnosis not present

## 2015-01-28 DIAGNOSIS — I10 Essential (primary) hypertension: Secondary | ICD-10-CM | POA: Diagnosis not present

## 2015-01-28 DIAGNOSIS — Z794 Long term (current) use of insulin: Secondary | ICD-10-CM | POA: Diagnosis not present

## 2015-01-28 NOTE — ED Provider Notes (Signed)
CSN: [id number]     Arrival date & time 01/28/15  1659 History   First MD Initiated Contact with Patient 01/28/15 1720     Chief Complaint  Patient presents with  . Rash     (Consider location/radiation/quality/duration/timing/severity/associated sxs/prior Treatment) Patient is a 44 y.o. male presenting with rash. The history is provided by the patient.  Rash Location:  Hand and leg Hand rash location:  L hand Leg rash location:  R leg and L leg Severity:  Moderate Duration:  2 weeks Timing:  Constant Progression:  Worsening Chronicity:  New  Daniel Nicholson is a 44 y.o. male who presents to the ED with itching and a rash to the dorsum of the left hand and back of his knees. The symptoms started 2 weeks ago. He has taken nothing for the itching. He denies change of lotions, detergents, soap or other products. He does not know if he may have come in contact with anything outside that may have caused the rash.   Past Medical History  Diagnosis Date  . Hypertension   . Acid reflux   . Gout   . Poor circulation   . Tremors of nervous system   . Diabetes mellitus without complication (HCC)   . Hemorrhoids     s/p right posterior, left lateral, right anterior banding. Last banding procedure Jan 2015.    Past Surgical History  Procedure Laterality Date  . None    . Colonoscopy N/A 12/16/2012    Dr. Jena Gauss- internal hemorrhoids otherwise normal. Screening in 2024.   [name] Hemorrhoid banding     Family History  Problem Relation Age of Onset  . Hypertension Mother   . Arthritis    . Colon cancer Neg Hx    Social History  Substance Use Topics  . Smoking status: Current Every Day Smoker -- 0.50 packs/day for 30 years    Types: Cigarettes  . Smokeless tobacco: Never Used     Comment: Smokes 6-7 cigarettes daily  . Alcohol Use: 0.0 oz/week    0 Standard drinks or equivalent per week     Comment: occ. beer but rare     Review of Systems  Skin: Positive for rash.  all other  systems negative     Allergies  Review of patient's allergies indicates no known allergies.  Home Medications   Prior to Admission medications   Medication Sig Start Date End Date Taking? Authorizing Provider  allopurinol (ZYLOPRIM) 300 MG tablet Take 300 mg by mouth every morning.     Historical Provider, MD  atorvastatin (LIPITOR) 20 MG tablet Take 20 mg by mouth daily.    Historical Provider, MD  colchicine 0.6 MG tablet Take 0.6 mg by mouth every morning.     Historical Provider, MD  gabapentin (NEURONTIN) 300 MG capsule Take 600 mg by mouth 2 (two) times daily.    Historical Provider, MD  hydrOXYzine (ATARAX/VISTARIL) 50 MG tablet Take 50 mg by mouth 3 (three) times daily as needed.    Historical Provider, MD  indomethacin (INDOCIN) 25 MG capsule Take 25 mg by mouth 2 (two) times daily with a meal.      Historical Provider, MD  insulin detemir (LEVEMIR) 100 UNIT/ML injection Inject 50 Units into the skin at bedtime.     Historical Provider, MD  lisinopril-hydrochlorothiazide (PRINZIDE,ZESTORETIC) 10-12.5 MG per tablet Take 1 tablet by mouth every morning.     Historical Provider, MD  loratadine (CLARITIN) 10 MG tablet Take 10 mg by mouth  daily.    Historical Provider, MD  metFORMIN (GLUCOPHAGE) 500 MG tablet Take 2 tablets (1,000 mg total) by mouth 2 (two) times daily. Patient taking 2 tablets in the morning and 1 tablet in the evening. 02/05/14   Gerhard Munchobert Lockwood, MD  methocarbamol (ROBAXIN) 500 MG tablet Take 1 tablet (500 mg total) by mouth 2 (two) times daily. 11/07/14   Garlon HatchetLisa M Sanders, PA-C  omeprazole (PRILOSEC) 20 MG capsule Take 20 mg by mouth 2 (two) times daily.     Historical Provider, MD   BP 124/83 mmHg  Pulse 94  Temp(Src) 98.3 F (36.8 C) (Oral)  Resp 16  Ht 6\' 3"  (1.905 m)  Wt 295 lb (133.811 kg)  BMI 36.87 kg/m2  SpO2 100% Physical Exam  Constitutional: He is oriented to person, place, and time. He appears well-developed and well-nourished. No distress.  HENT:   Head: Normocephalic.  Mouth/Throat: Oropharynx is clear and moist and mucous membranes are normal.  Eyes: Conjunctivae and EOM are normal.  Neck: Neck supple.  Cardiovascular: Normal rate and regular rhythm.   Pulmonary/Chest: Effort normal and breath sounds normal. He has no wheezes. He has no rales.  Abdominal: Soft. There is no tenderness.  Musculoskeletal: Normal range of motion.  See skin exam  Neurological: He is alert and oriented to person, place, and time. No cranial nerve deficit.  Skin: Skin is warm and dry.  There is a fine rash noted to the dorsum of the left hand and to the posterior aspect of the knees and upper calf. No increased warmth, no red streaking.  Psychiatric: He has a normal mood and affect. His behavior is normal.  Nursing note and vitals reviewed.   ED Course  Procedures  MDM  44 y.o. male with rash and itching to the left hand and posterior aspect of legs. Stable for d/c without respiratory symptoms, no difficulty swallowing and no swelling. Discussed with the patient plan of care and all questioned fully answered. He wants to only take Benadryl for the itching and follow up with his PCP. He will return here as needed.   Final diagnoses:  Rash and nonspecific skin eruption       Janne NapoleonHope M Koen Antilla, NP 01/28/15 1749  Samuel JesterKathleen McManus, DO 02/02/15 1231

## 2015-01-28 NOTE — Discharge Instructions (Signed)
Take Benadryl as needed for the itching. Follow up with your doctor. Return here as needed.

## 2015-01-28 NOTE — ED Notes (Signed)
Patient reports rash to left hand and bilateral legs x 2 weeks. Denies any knew detergents or lotions.

## 2015-02-02 NOTE — Progress Notes (Signed)
Quick Note:  LFTs look good. Repeat in 6 months. ______

## 2015-02-05 ENCOUNTER — Other Ambulatory Visit: Payer: Self-pay | Admitting: Gastroenterology

## 2015-02-05 DIAGNOSIS — K760 Fatty (change of) liver, not elsewhere classified: Secondary | ICD-10-CM

## 2015-07-09 ENCOUNTER — Other Ambulatory Visit: Payer: Self-pay

## 2015-07-09 DIAGNOSIS — K760 Fatty (change of) liver, not elsewhere classified: Secondary | ICD-10-CM

## 2015-07-17 LAB — HEPATIC FUNCTION PANEL
ALK PHOS: 31 U/L — AB (ref 40–115)
ALT: 22 U/L (ref 9–46)
AST: 20 U/L (ref 10–40)
Albumin: 4.3 g/dL (ref 3.6–5.1)
Bilirubin, Direct: 0.1 mg/dL (ref <=0.2)
Indirect Bilirubin: 0.4 mg/dL (ref 0.2–1.2)
Total Bilirubin: 0.5 mg/dL (ref 0.2–1.2)
Total Protein: 6.7 g/dL (ref 6.1–8.1)

## 2015-07-26 NOTE — Progress Notes (Signed)
Quick Note:  LFTs remaining normal. Would recommend repeat in 6 months. He should be returning to see us soon. ______

## 2015-07-27 ENCOUNTER — Encounter: Payer: Self-pay | Admitting: Internal Medicine

## 2015-07-27 ENCOUNTER — Other Ambulatory Visit: Payer: Self-pay | Admitting: Gastroenterology

## 2015-07-27 DIAGNOSIS — K760 Fatty (change of) liver, not elsewhere classified: Secondary | ICD-10-CM

## 2015-07-27 NOTE — Progress Notes (Signed)
APPT MADE AND LETTER SENT  °

## 2015-08-19 ENCOUNTER — Ambulatory Visit (INDEPENDENT_AMBULATORY_CARE_PROVIDER_SITE_OTHER): Payer: Medicaid Other | Admitting: Gastroenterology

## 2015-08-19 ENCOUNTER — Encounter: Payer: Self-pay | Admitting: Gastroenterology

## 2015-08-19 VITALS — BP 146/104 | HR 103 | Temp 97.0°F | Ht 76.0 in | Wt 283.2 lb

## 2015-08-19 DIAGNOSIS — K219 Gastro-esophageal reflux disease without esophagitis: Secondary | ICD-10-CM | POA: Diagnosis not present

## 2015-08-19 DIAGNOSIS — K760 Fatty (change of) liver, not elsewhere classified: Secondary | ICD-10-CM | POA: Diagnosis not present

## 2015-08-19 NOTE — Progress Notes (Signed)
Referring Provider: [facility] Primary Care Physician:  Alfredo Batty, NP  Primary GI: Dr. Jena Gauss   Chief Complaint  Patient presents with  . Follow-up    HPI:   Daniel Nicholson is a 45 y.o. male presenting today with a history of fatty liver. We have followed his LFTs serially, every 6 months. They have remained normal since 2015. He has been faithful to focus on dietary and behavior modification.   Down 13 lbs from last visit. Doing a lot of walking, watching what he is eating. No rectal bleeding. Sometimes has an urgency to have a BM. Will sometimes have a [place] #7. Usually food-related.   Past Medical History  Diagnosis Date  . Hypertension   . Acid reflux   . Gout   . Poor circulation   . Tremors of nervous system   . Diabetes mellitus without complication (HCC)   . Hemorrhoids     s/p right posterior, left lateral, right anterior banding. Last banding procedure Jan 2015.     Past Surgical History  Procedure Laterality Date  . None    . Colonoscopy N/A 12/16/2012    Dr. Jena Gauss- internal hemorrhoids otherwise normal. Screening in 2024.   [name] Hemorrhoid banding      Current Outpatient Prescriptions  Medication Sig Dispense Refill  . allopurinol (ZYLOPRIM) 300 MG tablet Take 300 mg by mouth every morning.     [name] atorvastatin (LIPITOR) 20 MG tablet Take 20 mg by mouth daily.    . colchicine 0.6 MG tablet Take 0.6 mg by mouth every morning.     . gabapentin (NEURONTIN) 300 MG capsule Take 600 mg by mouth 2 (two) times daily.    . hydrOXYzine (ATARAX/VISTARIL) 50 MG tablet Take 50 mg by mouth 3 (three) times daily as needed.    . indomethacin (INDOCIN) 25 MG capsule Take 25 mg by mouth 2 (two) times daily with a meal.      . insulin detemir (LEVEMIR) 100 UNIT/ML injection Inject 50 Units into the skin at bedtime.     [name] lisinopril-hydrochlorothiazide (PRINZIDE,ZESTORETIC) 10-12.5 MG per tablet Take 1 tablet by mouth every morning.     . loratadine (CLARITIN)  10 MG tablet Take 10 mg by mouth daily.    . metFORMIN (GLUCOPHAGE) 500 MG tablet Take 2 tablets (1,000 mg total) by mouth 2 (two) times daily. Patient taking 2 tablets in the morning and 1 tablet in the evening. 60 tablet 0  . methocarbamol (ROBAXIN) 500 MG tablet Take 1 tablet (500 mg total) by mouth 2 (two) times daily. 20 tablet 0  . omeprazole (PRILOSEC) 20 MG capsule Take 20 mg by mouth 2 (two) times daily.      No current facility-administered medications for this visit.    Allergies as of 08/19/2015  . (No Known Allergies)    Family History  Problem Relation Age of Onset  . Hypertension Mother   . Arthritis    . Colon cancer Neg Hx     Social History   Social History  . Marital Status: Divorced    Spouse Name: N/A  . Number of Children: N/A  . Years of Education: 12th grade   Occupational History  . unemployed    Social History Main Topics  . Smoking status: Current Every Day Smoker -- 0.50 packs/day for 30 years    Types: Cigarettes  . Smokeless tobacco: Never Used     Comment: Smokes 6-7 cigarettes daily  . Alcohol Use:  0.0 oz/week    0 Standard drinks or equivalent per week     Comment: occ. beer but rare   . Drug Use: No  . Sexual Activity: Yes    Birth Control/ Protection: Condom   Other Topics Concern  . None   Social History Narrative    Review of Systems: Negative unless mentioned in HPI.   Physical Exam: BP 146/104 mmHg  Pulse 103  Temp(Src) 97 F (36.1 C)  Ht 6\' 4"  (1.93 m)  Wt 283 lb 3.2 oz (128.459 kg)  BMI 34.49 kg/m2 General:   Alert and oriented. No distress noted. Pleasant and cooperative.  Head:  Normocephalic and atraumatic. Eyes:  Conjuctiva clear without scleral icterus. Abdomen:  +BS, soft, non-tender and non-distended. No rebound or guarding. No HSM or masses noted. Msk:  Symmetrical without gross deformities. Normal posture. Extremities:  Without edema. Neurologic:  Alert and  oriented x4;  grossly normal  neurologically. Psych:  Alert and cooperative. Normal mood and affect.   Lab Results  Component Value Date   ALT 22 07/16/2015   AST 20 07/16/2015   ALKPHOS 31* 07/16/2015   BILITOT 0.5 07/16/2015

## 2015-08-19 NOTE — Assessment & Plan Note (Signed)
Continue excellent dietary/behavior modification. Applauded on weight loss efforts. Serial LFTs remain normal. Continue to check every 6 months. Return in 1 year. Up-to-date on screening colonoscopy, with next in 2024.

## 2015-08-19 NOTE — Patient Instructions (Signed)
Great job on the weight loss! I am so proud of you! This is excellent for your health and your liver.   We will see you back in 1 year.

## 2015-08-19 NOTE — Assessment & Plan Note (Signed)
Continue PPI ?

## 2015-08-19 NOTE — Progress Notes (Signed)
cc'ed to pcp °

## 2015-09-15 ENCOUNTER — Emergency Department (HOSPITAL_COMMUNITY)
Admission: EM | Admit: 2015-09-15 | Discharge: 2015-09-15 | Disposition: A | Discharge status: Home / Self Care | Payer: Medicaid Other | Attending: Emergency Medicine | Admitting: Emergency Medicine

## 2015-09-15 ENCOUNTER — Encounter (HOSPITAL_COMMUNITY): Payer: Self-pay | Admitting: Emergency Medicine

## 2015-09-15 DIAGNOSIS — Z7984 Long term (current) use of oral hypoglycemic drugs: Secondary | ICD-10-CM | POA: Diagnosis not present

## 2015-09-15 DIAGNOSIS — Z79899 Other long term (current) drug therapy: Secondary | ICD-10-CM | POA: Insufficient documentation

## 2015-09-15 DIAGNOSIS — I10 Essential (primary) hypertension: Secondary | ICD-10-CM | POA: Diagnosis not present

## 2015-09-15 DIAGNOSIS — F410 Panic disorder [episodic paroxysmal anxiety] without agoraphobia: Secondary | ICD-10-CM | POA: Insufficient documentation

## 2015-09-15 DIAGNOSIS — E119 Type 2 diabetes mellitus without complications: Secondary | ICD-10-CM | POA: Diagnosis not present

## 2015-09-15 DIAGNOSIS — Z794 Long term (current) use of insulin: Secondary | ICD-10-CM | POA: Insufficient documentation

## 2015-09-15 DIAGNOSIS — F419 Anxiety disorder, unspecified: Secondary | ICD-10-CM | POA: Diagnosis present

## 2015-09-15 LAB — CBG MONITORING, ED: GLUCOSE-CAPILLARY: 118 mg/dL — AB (ref 65–99)

## 2015-09-15 MED ORDER — ALPRAZOLAM 1 MG PO TABS: 1.0000 mg | ORAL_TABLET | Freq: Three times a day (TID) | ORAL | Status: DC | PRN

## 2015-09-15 MED ORDER — LORAZEPAM 1 MG PO TABS
2.0000 mg | ORAL_TABLET | Freq: Once | ORAL | Status: AC
  Administered 2015-09-15: 2 mg via ORAL
  Filled 2015-09-15: qty 2

## 2015-09-15 NOTE — ED Notes (Signed)
Pt c/o anxiety, sob and states he does not feel right.

## 2015-09-15 NOTE — Discharge Instructions (Signed)

## 2015-09-15 NOTE — ED Provider Notes (Signed)
CSN: [id number]     Arrival date & time 09/15/15  0602 History   First MD Initiated Contact with Patient 09/15/15 [phone]     Chief Complaint  Patient presents with  . Anxiety     (Consider location/radiation/quality/duration/timing/severity/associated sxs/prior Treatment) HPI Comments: Patient presents to the emergency department for evaluation of shortness of breath. Patient reports that he awakened this morning and felt severe anxiety. He felt like he was having trouble catching his breath. There is no associated chest pain. Patient does report that he has a history of anxiety and panic attack and has had similar symptoms with panic attack. He does, however, report that he has not had a panic attack for approximately 2 years.  Patient is a 45 y.o. male presenting with anxiety.  Anxiety    Past Medical History  Diagnosis Date  . Hypertension   . Acid reflux   . Gout   . Poor circulation   . Tremors of nervous system   . Diabetes mellitus without complication (HCC)   . Hemorrhoids     s/p right posterior, left lateral, right anterior banding. Last banding procedure Jan 2015.    Past Surgical History  Procedure Laterality Date  . None    . Colonoscopy N/A 12/16/2012    Dr. Jena Gauss- internal hemorrhoids otherwise normal. Screening in 2024.   [name] Hemorrhoid banding     Family History  Problem Relation Age of Onset  . Hypertension Mother   . Arthritis    . Colon cancer Neg Hx    Social History  Substance Use Topics  . Smoking status: Current Every Day Smoker -- 0.50 packs/day for 30 years    Types: Cigarettes  . Smokeless tobacco: Never Used     Comment: Smokes 6-7 cigarettes daily  . Alcohol Use: 0.0 oz/week    0 Standard drinks or equivalent per week     Comment: occ. beer but rare     Review of Systems  Psychiatric/Behavioral: The patient is nervous/anxious.   All other systems reviewed and are negative.     Allergies  Review of patient's allergies indicates no known  allergies.  Home Medications   Prior to Admission medications   Medication Sig Start Date End Date Taking? Authorizing Provider  allopurinol (ZYLOPRIM) 300 MG tablet Take 300 mg by mouth every morning.    Yes Historical Provider, MD  atorvastatin (LIPITOR) 20 MG tablet Take 20 mg by mouth daily.   Yes Historical Provider, MD  colchicine 0.6 MG tablet Take 0.6 mg by mouth every morning.    Yes Historical Provider, MD  gabapentin (NEURONTIN) 300 MG capsule Take 600 mg by mouth 2 (two) times daily.   Yes Historical Provider, MD  hydrOXYzine (ATARAX/VISTARIL) 50 MG tablet Take 50 mg by mouth 3 (three) times daily as needed.   Yes Historical Provider, MD  indomethacin (INDOCIN) 25 MG capsule Take 25 mg by mouth 2 (two) times daily with a meal.     Yes Historical Provider, MD  insulin detemir (LEVEMIR) 100 UNIT/ML injection Inject 50 Units into the skin at bedtime.    Yes Historical Provider, MD  lisinopril-hydrochlorothiazide (PRINZIDE,ZESTORETIC) 10-12.5 MG per tablet Take 1 tablet by mouth every morning.    Yes Historical Provider, MD  loratadine (CLARITIN) 10 MG tablet Take 10 mg by mouth daily.   Yes Historical Provider, MD  metFORMIN (GLUCOPHAGE) 500 MG tablet Take 2 tablets (1,000 mg total) by mouth 2 (two) times daily. Patient taking 2 tablets in the morning  and 1 tablet in the evening. 02/05/14  Yes Gerhard Munchobert Lockwood, MD  omeprazole (PRILOSEC) 20 MG capsule Take 20 mg by mouth 2 (two) times daily.    Yes Historical Provider, MD  methocarbamol (ROBAXIN) 500 MG tablet Take 1 tablet (500 mg total) by mouth 2 (two) times daily. 11/07/14   Garlon HatchetLisa M Sanders, PA-C   BP 132/93 mmHg  Pulse 85  Temp(Src) 97.3 F (36.3 C)  Resp 18  Ht 6\' 4"  (1.93 m)  Wt 292 lb (132.45 kg)  BMI 35.56 kg/m2  SpO2 97% Physical Exam  Constitutional: He is oriented to person, place, and time. He appears well-developed and well-nourished. No distress.  HENT:  Head: Normocephalic and atraumatic.  Right Ear: Hearing  normal.  Left Ear: Hearing normal.  Nose: Nose normal.  Mouth/Throat: Oropharynx is clear and moist and mucous membranes are normal.  Eyes: Conjunctivae and EOM are normal. Pupils are equal, round, and reactive to light.  Neck: Normal range of motion. Neck supple.  Cardiovascular: Regular rhythm, S1 normal and S2 normal.  Exam reveals no gallop and no friction rub.   No murmur heard. Pulmonary/Chest: Effort normal and breath sounds normal. No respiratory distress. He exhibits no tenderness.  Abdominal: Soft. Normal appearance and bowel sounds are normal. There is no hepatosplenomegaly. There is no tenderness. There is no rebound, no guarding, no tenderness at McBurney's point and negative Murphy's sign. No hernia.  Musculoskeletal: Normal range of motion.  Neurological: He is alert and oriented to person, place, and time. He has normal strength. No cranial nerve deficit or sensory deficit. Coordination normal. GCS eye subscore is 4. GCS verbal subscore is 5. GCS motor subscore is 6.  Skin: Skin is warm, dry and intact. No rash noted. No cyanosis.  Psychiatric: His speech is normal and behavior is normal. Thought content normal. His mood appears anxious.  Nursing note and vitals reviewed.   ED Course  Procedures (including critical care time) Labs Review Labs Reviewed - No data to display  Imaging Review No results found. I have personally reviewed and evaluated these images and lab results as part of my medical decision-making.   EKG Interpretation   Date/Time:  Wednesday September 15 2015 06:14:42 EDT Ventricular Rate:  77 PR Interval:  140 QRS Duration: 98 QT Interval:  375 QTC Calculation: 424 R Axis:   51 Text Interpretation:  Sinus rhythm Normal ECG Confirmed by POLLINA  MD,  CHRISTOPHER [phone]([id number]) on 09/15/2015 6:19:38 AM      MDM   Final diagnoses:  Panic attack    [facility] complaints of severe anxiety. He awakens with a feeling of dread and  reports that he just felt like he was going to die. He does report a previous history of panic attack with similar symptoms. Patient not experiencing chest pain. He did feel like he was having some difficulty breathing, but lungs are clear and oxygenation is normal. I do not have any concern for PE. EKG is normal, not expecting chest pain, not felt to be likely cardiac in nature. Patient's symptoms are consistent with acute anxiety and panic attack. Patient feeling much better after administration of Ativan. No further workup necessary.    Gilda Creasehristopher J Pollina, MD 09/15/15 [phone]

## 2015-12-29 ENCOUNTER — Other Ambulatory Visit: Payer: Self-pay

## 2015-12-29 DIAGNOSIS — K760 Fatty (change of) liver, not elsewhere classified: Secondary | ICD-10-CM

## 2016-01-03 ENCOUNTER — Telehealth: Payer: Self-pay | Admitting: Internal Medicine

## 2016-01-03 NOTE — Telephone Encounter (Signed)
PATIENT RECEIVED ORDER TO HAVE LABS DRAWN AND WANTS TO KNOW IF WE CAN ADD STD'S AND HIV TO IT  PLEASE ADVISE [phone]

## 2016-01-04 ENCOUNTER — Telehealth: Payer: Self-pay | Admitting: Internal Medicine

## 2016-01-04 NOTE — Telephone Encounter (Signed)
Routing to Anna

## 2016-01-04 NOTE — Telephone Encounter (Signed)
Pt is having HFP done. Routing to [place].

## 2016-01-04 NOTE — Telephone Encounter (Signed)
Patient called back today asking about his lab orders and being tested for "other diseases". I told him that the nurse was still at lunch and I will let her know that he had called back. Please advise and call [phone]

## 2016-01-05 NOTE — Telephone Encounter (Signed)
See other phone note

## 2016-01-05 NOTE — Telephone Encounter (Signed)
Needs to have that done through PCP. Thanks!

## 2016-01-06 NOTE — Telephone Encounter (Signed)
Patient made aware that he needs to reach out to his PCP

## 2016-01-08 LAB — HEPATIC FUNCTION PANEL
ALK PHOS: 29 U/L — AB (ref 40–115)
ALT: 15 U/L (ref 9–46)
AST: 20 U/L (ref 10–40)
Albumin: 4.3 g/dL (ref 3.6–5.1)
BILIRUBIN DIRECT: 0.1 mg/dL (ref <=0.2)
Indirect Bilirubin: 0.4 mg/dL (ref 0.2–1.2)
TOTAL PROTEIN: 6.6 g/dL (ref 6.1–8.1)
Total Bilirubin: 0.5 mg/dL (ref 0.2–1.2)

## 2016-01-12 NOTE — Progress Notes (Signed)
Repeat LFTs yearly. Normal.

## 2016-01-19 ENCOUNTER — Other Ambulatory Visit: Payer: Self-pay | Admitting: Gastroenterology

## 2016-01-19 DIAGNOSIS — K760 Fatty (change of) liver, not elsewhere classified: Secondary | ICD-10-CM

## 2016-07-13 ENCOUNTER — Encounter: Payer: Self-pay | Admitting: Internal Medicine

## 2016-08-16 DIAGNOSIS — K08409 Partial loss of teeth, unspecified cause, unspecified class: Secondary | ICD-10-CM

## 2016-08-16 HISTORY — DX: Partial loss of teeth, unspecified cause, unspecified class: K08.409

## 2016-08-23 ENCOUNTER — Ambulatory Visit (INDEPENDENT_AMBULATORY_CARE_PROVIDER_SITE_OTHER): Payer: Medicaid Other | Admitting: Nurse Practitioner

## 2016-08-23 ENCOUNTER — Encounter: Payer: Self-pay | Admitting: Nurse Practitioner

## 2016-08-23 VITALS — BP 115/75 | HR 88 | Temp 98.0°F | Ht 76.0 in | Wt 256.0 lb

## 2016-08-23 DIAGNOSIS — K219 Gastro-esophageal reflux disease without esophagitis: Secondary | ICD-10-CM

## 2016-08-23 DIAGNOSIS — K760 Fatty (change of) liver, not elsewhere classified: Secondary | ICD-10-CM

## 2016-08-23 NOTE — Patient Instructions (Signed)
1. Continue taking your acid blocker medicine. 2. Have your lab work checked in the next couple days. 3. Recheck your labs in 6 months. We will remind you when it is time to have this done. 4. Return for follow-up in one year. 5. All if you have any worsening or severe symptoms and we can try to see you before then.

## 2016-08-23 NOTE — Progress Notes (Signed)
Referring Provider: Alfredo BattyMalik, Adolf, NP Primary Care Physician:  Alfredo BattyMalik, Adolf, NP Primary GI:  Dr. Jena Gaussourk  Chief Complaint  Patient presents with  . Gastroesophageal Reflux    HPI:   Daniel Nicholson is a 46 y.o. male who presents for follow-up on GERD. The patient was last seen in our office 08/19/2015 for GERD and fatty liver. Serial LFTs every 6 months remained normal since 2015, faithful to focus on dietary and behavior modification, weight down 13 pounds with a lot of walking and watching what he is eating. Occasional fecal urgency and loose stools. No rectal bleeding. Typically food related.  Recommend serial LFTs every 6 months, return for follow-up in one year. Continue PPI. Up-to-date on colonoscopy next due 2024.  Last HFP completed 01/08/2016 with normal LFTs.  Today he states he's doing well overall. Continuing to do well with weight loss. GERD well controlled and without breakthrough symptoms. Denies dysphagia and odynophagia, abdominal pain, N/V, hematochezia, melena, acute changes in bowel habits/consistency/caliber. Has objectively lost about 28 lbs in the last year with concerted efforts with diet and exercise/healthy lifestyle. Denies yellowing of skin/eyes, darkened urine, acute episodic confusion, tremors. Denies chest pain, dyspnea, dizziness, lightheadedness, syncope, near syncope. Denies any other upper or lower GI symptoms.  Past Medical History:  Diagnosis Date  . Acid reflux   . Diabetes mellitus without complication (HCC)   . Gout   . Hemorrhoids    s/p right posterior, left lateral, right anterior banding. Last banding procedure Jan 2015.   [name]. Hypertension   . Poor circulation   . S/P tooth extraction 08/16/2016   three teeth extracted  . Tremors of nervous system     Past Surgical History:  Procedure Laterality Date  . COLONOSCOPY N/A 12/16/2012   Dr. Jena Gaussourk- internal hemorrhoids otherwise normal. Screening in 2024.   [name]. HEMORRHOID BANDING    . None        Current Outpatient Prescriptions  Medication Sig Dispense Refill  . allopurinol (ZYLOPRIM) 300 MG tablet Take 300 mg by mouth every morning.     [name]. ALPRAZolam (XANAX) 1 MG tablet Take 1 tablet (1 mg total) by mouth 3 (three) times daily as needed for anxiety. 10 tablet 0  . atorvastatin (LIPITOR) 20 MG tablet Take 20 mg by mouth daily.    . colchicine 0.6 MG tablet Take 0.6 mg by mouth every morning.     . gabapentin (NEURONTIN) 300 MG capsule Take 600 mg by mouth 2 (two) times daily.    . hydrOXYzine (ATARAX/VISTARIL) 50 MG tablet Take 50 mg by mouth 3 (three) times daily as needed.    . indomethacin (INDOCIN) 25 MG capsule Take 25 mg by mouth 2 (two) times daily with a meal.      . insulin detemir (LEVEMIR) 100 UNIT/ML injection Inject 49 Units into the skin at bedtime.     [name]. lisinopril-hydrochlorothiazide (PRINZIDE,ZESTORETIC) 10-12.5 MG per tablet Take 1 tablet by mouth every morning.     . loratadine (CLARITIN) 10 MG tablet Take 10 mg by mouth daily.    . metFORMIN (GLUCOPHAGE) 500 MG tablet Take 2 tablets (1,000 mg total) by mouth 2 (two) times daily. Patient taking 2 tablets in the morning and 1 tablet in the evening. 60 tablet 0  . methocarbamol (ROBAXIN) 500 MG tablet Take 1 tablet (500 mg total) by mouth 2 (two) times daily. 20 tablet 0  . omeprazole (PRILOSEC) 20 MG capsule Take 20 mg by mouth 2 (two) times daily.  No current facility-administered medications for this visit.     Allergies as of 08/23/2016  . (No Known Allergies)    Family History  Problem Relation Age of Onset  . Hypertension Mother   . Arthritis Unknown   . Colon cancer Neg Hx     Social History   Social History  . Marital status: Divorced    Spouse name: N/A  . Number of children: N/A  . Years of education: 12th grade   Occupational History  . unemployed    Social History Main Topics  . Smoking status: Current Every Day Smoker    Packs/day: 0.50    Years: 30.00    Types: Cigarettes   . Smokeless tobacco: Never Used     Comment: Smokes 6-7 cigarettes daily  . Alcohol use 0.0 oz/week     Comment: occ. beer but rare   . Drug use: No  . Sexual activity: Yes    Birth control/ protection: Condom   Other Topics Concern  . None   Social History Narrative  . None    Review of Systems: General: Negative for anorexia, weight loss, fever, chills, fatigue, weakness. ENT: Negative for hoarseness, difficulty swallowing. CV: Negative for chest pain, angina, palpitations, peripheral edema.  Respiratory: Negative for dyspnea at rest, cough, sputum, wheezing.  GI: See history of present illness. Endo: Negative for unusual weight change.  Heme: Negative for bruising or bleeding. Allergy: Negative for rash or hives.   Physical Exam: BP 115/75   Pulse 88   Temp 98 F (36.7 C) (Oral)   Ht 6\' 4"  (1.93 m)   Wt 256 lb (116.1 kg)   BMI 31.16 kg/m  General:   Obese male. Alert and oriented. Pleasant and cooperative. Well-nourished and well-developed.  Eyes:  Without icterus, sclera clear and conjunctiva pink.  Ears:  Normal auditory acuity. Cardiovascular:  S1, S2 present without murmurs appreciated. Extremities without clubbing or edema. Respiratory:  Clear to auscultation bilaterally. No wheezes, rales, or rhonchi. No distress.  Gastrointestinal:  +BS, obese but soft, non-tender and non-distended. No HSM noted. No guarding or rebound. No masses appreciated.  Rectal:  Deferred  Musculoskalatal:  Symmetrical without gross deformities. Neurologic:  Alert and oriented x4;  grossly normal neurologically. Psych:  Alert and cooperative. Normal mood and affect. Heme/Lymph/Immune: No excessive bruising noted.    08/23/2016 11:16 AM   Disclaimer: This note was dictated with voice recognition software. Similar sounding words can inadvertently be transcribed and may not be corrected upon review.

## 2016-08-23 NOTE — Assessment & Plan Note (Signed)
History fatty liver disease. He has done exceptionally well with diet and exercise and is continued to lose significant weight. He has lost another 28 pounds in the past year with diet and exercise. His LFTs have been normal since 2015. He is to be congratulated on his efforts. At this point I will continue monitoring of his liver enzymes. I will order an HFP now and again in 6 months. Return for follow-up in one year. We can see him before then for any concerns or worsening symptoms.

## 2016-08-23 NOTE — Assessment & Plan Note (Signed)
Chronic GERD symptoms currently well-controlled on PPI. Recommend continue PPI, return for follow-up in one year. Call if any worsening or severe symptoms before then.

## 2016-08-28 NOTE — Progress Notes (Signed)
cc'd to pcp 

## 2016-08-29 LAB — HEPATIC FUNCTION PANEL
ALBUMIN: 4.2 g/dL (ref 3.6–5.1)
ALT: 16 U/L (ref 9–46)
AST: 20 U/L (ref 10–40)
Alkaline Phosphatase: 36 U/L — ABNORMAL LOW (ref 40–115)
BILIRUBIN TOTAL: 0.4 mg/dL (ref 0.2–1.2)
Bilirubin, Direct: 0.1 mg/dL (ref <=0.2)
Indirect Bilirubin: 0.3 mg/dL (ref 0.2–1.2)
Total Protein: 6.1 g/dL (ref 6.1–8.1)

## 2016-12-09 ENCOUNTER — Emergency Department (HOSPITAL_COMMUNITY)
Admission: EM | Admit: 2016-12-09 | Discharge: 2016-12-09 | Disposition: A | Discharge status: Home / Self Care | Payer: Medicaid Other | Attending: Emergency Medicine | Admitting: Emergency Medicine

## 2016-12-09 ENCOUNTER — Encounter (HOSPITAL_COMMUNITY): Payer: Self-pay | Admitting: Emergency Medicine

## 2016-12-09 DIAGNOSIS — Z7984 Long term (current) use of oral hypoglycemic drugs: Secondary | ICD-10-CM | POA: Diagnosis not present

## 2016-12-09 DIAGNOSIS — I10 Essential (primary) hypertension: Secondary | ICD-10-CM | POA: Insufficient documentation

## 2016-12-09 DIAGNOSIS — L989 Disorder of the skin and subcutaneous tissue, unspecified: Secondary | ICD-10-CM | POA: Diagnosis not present

## 2016-12-09 DIAGNOSIS — E119 Type 2 diabetes mellitus without complications: Secondary | ICD-10-CM | POA: Diagnosis not present

## 2016-12-09 DIAGNOSIS — R238 Other skin changes: Secondary | ICD-10-CM

## 2016-12-09 DIAGNOSIS — Z79899 Other long term (current) drug therapy: Secondary | ICD-10-CM | POA: Diagnosis not present

## 2016-12-09 DIAGNOSIS — F17210 Nicotine dependence, cigarettes, uncomplicated: Secondary | ICD-10-CM | POA: Diagnosis not present

## 2016-12-09 MED ORDER — BLISTEX LIP BALM 2-2.5-6.6 % EX STCK: CUTANEOUS | 0 refills | Status: DC

## 2016-12-09 MED ORDER — AMOXICILLIN 500 MG PO CAPS: 500.0000 mg | ORAL_CAPSULE | Freq: Three times a day (TID) | ORAL | 0 refills | Status: DC

## 2016-12-09 NOTE — Discharge Instructions (Signed)
Please use cool compresses to the lips. Please use Blistex 4 times daily. Use amoxil  3 times daily. Please see Dr. August Saucerean or return to the emergency department if not improving.

## 2016-12-09 NOTE — ED Provider Notes (Signed)
[facility]EMER[facility]ENCY DEPT Provider Note   CSN: [id number] Arrival date & time: 12/09/16  2041     History   Chief Complaint Chief Complaint  Patient presents with  . Mouth Lesions    HPI Daniel Nicholson is a 46 y.o. male.  Patient is a 46 year old male who presents to the emergency department with swelling and a rotation of the corners of his mouth.  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 He wiped the corners of his mouth with his glove and he thinks some sealant may have been on it. Shortly after that he noticed some swelling at the corners of his mouth and irritation of the skin. He denies any fever or chills. He denies any difficulty with breathing. No problem swallowing. No hives or other rash appreciated. The patient presents to the emergency department for assistance with this issue and to ensure that he's not having an allergic type reaction.        Past Medical History:  Diagnosis Date  . Acid reflux   . Diabetes mellitus without complication (HCC)   . Gout   . Hemorrhoids    s/p right posterior, left lateral, right anterior banding. Last banding procedure Jan 2015.   [name] Hypertension   . Poor circulation   . S/P tooth extraction 08/16/2016   three teeth extracted  . Tremors of nervous system     Patient Active Problem List   Diagnosis Date Noted  . Fatty liver 12/05/2012  . Rectal bleeding 12/05/2012  . Gross hematuria 12/05/2012  . 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 Acid reflux   . Gout   . FOOT PAIN 01/30/2008    Past Surgical History:  Procedure Laterality Date  . COLONOSCOPY N/A 12/16/2012   Dr. Jena Gauss- internal hemorrhoids otherwise normal. Screening in 2024.   [name] HEMORRHOID BANDING    . None         Home Medications    Prior to Admission medications   Medication Sig Start Date End Date Taking? Authorizing Provider  acetaminophen (TYLENOL 8 HOUR) 650 MG CR tablet Take 1,300 mg by mouth daily.   Yes [provider]  allopurinol (ZYLOPRIM) 300 MG tablet Take 300 mg by mouth every morning.    Yes [provider]  atorvastatin (LIPITOR) 20 MG tablet Take 20 mg by mouth daily.   Yes [provider]  colchicine 0.6 MG tablet Take 0.6 mg by mouth every morning.    Yes [provider]  gabapentin (NEURONTIN) 300 MG capsule Take 600 mg by mouth 2 (two) times daily.   Yes [provider]  insulin detemir (LEVEMIR) 100 UNIT/ML injection Inject 49 Units into the skin at bedtime.    Yes [provider]  linagliptin (TRADJENTA) 5 MG TABS tablet Take 5 mg by mouth daily.   Yes [provider]  lisinopril-hydrochlorothiazide (PRINZIDE,ZESTORETIC) 10-12.5 MG per tablet Take 1 tablet by mouth every morning.    Yes [provider]  loratadine (CLARITIN) 10 MG tablet Take 10 mg by mouth daily.   Yes [provider]  metFORMIN (GLUCOPHAGE) 500 MG tablet Take 2 tablets (1,000 mg total) by mouth 2 (two) times daily. Patient taking 2 tablets in the morning and 1 tablet in the evening. 02/05/14  Yes Gerhard Munch, MD    Family History Family History  Problem Relation Age of Onset  . Hypertension Mother   . Arthritis Unknown   . Colon cancer Neg Hx  Social History Social History  Substance Use Topics  . Smoking status: Current Every Day Smoker    Packs/day: 0.50    Years: 30.00    Types: Cigarettes  . Smokeless tobacco: Never Used     Comment: Smokes 6-7 cigarettes daily  . Alcohol use 0.0 oz/week     Comment: occ. beer but rare      Allergies   Patient has no known allergies.   Review of Systems Review of Systems  Constitutional: Negative for activity change.       All ROS Neg except as noted in HPI  HENT: Negative for nosebleeds.   Eyes: Negative for photophobia and discharge.  Respiratory: Negative for cough, shortness of breath and wheezing.        There is symmetrical rise and fall of the chest. The patient  speaks in complete sentences without problem.  Cardiovascular: Negative for chest pain and palpitations.  Gastrointestinal: Negative for abdominal pain and blood in stool.  Genitourinary: Negative for dysuria, frequency and hematuria.  Musculoskeletal: Negative for arthralgias, back pain and neck pain.  Skin: Negative.   Neurological: Negative for dizziness, seizures and speech difficulty.  Psychiatric/Behavioral: Negative for confusion and hallucinations.     Physical Exam Updated Vital Signs BP (!) 148/99 (BP Location: Left Arm)   Pulse 63   Temp 98.4 F (36.9 C) (Oral)   Resp 16   Ht 6\' 4"  (1.93 m)   Wt 114.8 kg (253 lb)   SpO2 98%   BMI 30.80 kg/m   Physical Exam  Constitutional: He is oriented to person, place, and time. He appears well-developed and well-nourished.  Non-toxic appearance.  HENT:  Head: Normocephalic.  Right Ear: Tympanic membrane and external ear normal.  Left Ear: Tympanic membrane and external ear normal.  Mouth/Throat:    Swollen irritated area of the right and left side of the mouth.  No swelling of the tongue. Airway is patent.  Eyes: Pupils are equal, round, and reactive to light. EOM and lids are normal.  Neck: Normal range of motion. Neck supple. Carotid bruit is not present.  Cardiovascular: Normal rate, regular rhythm, normal heart sounds, intact distal pulses and normal pulses.   Pulmonary/Chest: Breath sounds normal. No respiratory distress.  Abdominal: Soft. Bowel sounds are normal. There is no tenderness. There is no guarding.  Musculoskeletal: Normal range of motion.  Lymphadenopathy:       Head (right side): No submandibular adenopathy present.       Head (left side): No submandibular adenopathy present.    He has no cervical adenopathy.  Neurological: He is alert and oriented to person, place, and time. He has normal strength. No cranial nerve deficit or sensory deficit.  Skin: Skin is warm and dry.  Psychiatric: He has a normal  mood and affect. His speech is normal.  Nursing note and vitals reviewed.    ED Treatments / Results  Labs (all labs ordered are listed, but only abnormal results are displayed) Labs Reviewed - No data to display  EKG  EKG Interpretation None       Radiology No results found.  Procedures Procedures (including critical care time)  Medications Ordered in ED Medications - No data to display   Initial Impression / Assessment and Plan / ED Course  I have reviewed the triage vital signs and the nursing notes.  Pertinent labs & imaging results that were available during my care of the patient were reviewed by me and considered in my medical decision  making (see chart for details).       Final Clinical Impressions(s) / ED Diagnoses MDM Blood pressure is slightly elevated, otherwise the vital signs are within normal limits. The patient speaks in complete sentences without problem. Suspect local response only. Patient has 2 areas raised with skin irritation noted. The patient is advised to cleanse these areas with soap and water. He will apply cool compresses to the areas. He will use Amoxil to prevent infection, and Blistex for comfort. The patient is to see Dr. August Saucerean or return to the emergency department if any changes or problems.    Final diagnoses:  Skin irritation    New Prescriptions New Prescriptions   No medications on file     Ivery QualeBryant, Dickie Cloe, Cordelia Poche-C 12/10/16 2211    Samuel JesterMcManus, Kathleen, DO 12/13/16 1330

## 2016-12-09 NOTE — ED Notes (Signed)
Pt family to nurses station asking when they will be seen by Dr Informed that PA has signed up to see them and it should not be much longer.

## 2016-12-09 NOTE — ED Triage Notes (Signed)
Red irritated areas to both corners of mouth.  Pt reports he was repairing a pipe with "fiber fix" and rubbed his glove on his mouth by mistake. Reports stinging with he licks lips

## 2016-12-14 ENCOUNTER — Other Ambulatory Visit: Payer: Self-pay

## 2016-12-14 DIAGNOSIS — K760 Fatty (change of) liver, not elsewhere classified: Secondary | ICD-10-CM

## 2016-12-27 ENCOUNTER — Emergency Department (HOSPITAL_COMMUNITY): Payer: Medicaid Other

## 2016-12-27 ENCOUNTER — Inpatient Hospital Stay (HOSPITAL_COMMUNITY)
Admission: EM | Admit: 2016-12-27 | Discharge: 2016-12-29 | DRG: 683 | Disposition: A | Discharge status: Home / Self Care | Payer: Medicaid Other | Attending: Internal Medicine | Admitting: Internal Medicine

## 2016-12-27 ENCOUNTER — Encounter (HOSPITAL_COMMUNITY): Payer: Self-pay | Admitting: *Deleted

## 2016-12-27 DIAGNOSIS — I959 Hypotension, unspecified: Secondary | ICD-10-CM

## 2016-12-27 DIAGNOSIS — M109 Gout, unspecified: Secondary | ICD-10-CM | POA: Diagnosis present

## 2016-12-27 DIAGNOSIS — M6282 Rhabdomyolysis: Secondary | ICD-10-CM | POA: Diagnosis not present

## 2016-12-27 DIAGNOSIS — F17210 Nicotine dependence, cigarettes, uncomplicated: Secondary | ICD-10-CM | POA: Diagnosis present

## 2016-12-27 DIAGNOSIS — Z794 Long term (current) use of insulin: Secondary | ICD-10-CM

## 2016-12-27 DIAGNOSIS — G8929 Other chronic pain: Secondary | ICD-10-CM | POA: Diagnosis present

## 2016-12-27 DIAGNOSIS — I10 Essential (primary) hypertension: Secondary | ICD-10-CM | POA: Diagnosis present

## 2016-12-27 DIAGNOSIS — E860 Dehydration: Secondary | ICD-10-CM

## 2016-12-27 DIAGNOSIS — N179 Acute kidney failure, unspecified: Secondary | ICD-10-CM | POA: Diagnosis not present

## 2016-12-27 DIAGNOSIS — M5030 Other cervical disc degeneration, unspecified cervical region: Secondary | ICD-10-CM | POA: Diagnosis present

## 2016-12-27 DIAGNOSIS — Z79899 Other long term (current) drug therapy: Secondary | ICD-10-CM

## 2016-12-27 DIAGNOSIS — R55 Syncope and collapse: Secondary | ICD-10-CM

## 2016-12-27 DIAGNOSIS — E119 Type 2 diabetes mellitus without complications: Secondary | ICD-10-CM | POA: Diagnosis present

## 2016-12-27 HISTORY — DX: Cervicalgia: M54.2

## 2016-12-27 LAB — COMPREHENSIVE METABOLIC PANEL
ALBUMIN: 4.7 g/dL (ref 3.5–5.0)
ALK PHOS: 34 U/L — AB (ref 38–126)
ALT: 41 U/L (ref 17–63)
AST: 58 U/L — ABNORMAL HIGH (ref 15–41)
Anion gap: 9 (ref 5–15)
BILIRUBIN TOTAL: 0.9 mg/dL (ref 0.3–1.2)
BUN: 17 mg/dL (ref 6–20)
CALCIUM: 9.1 mg/dL (ref 8.9–10.3)
CO2: 27 mmol/L (ref 22–32)
CREATININE: 2.22 mg/dL — AB (ref 0.61–1.24)
Chloride: 101 mmol/L (ref 101–111)
GFR calc Af Amer: 39 mL/min — ABNORMAL LOW (ref >60)
GFR calc non Af Amer: 34 mL/min — ABNORMAL LOW (ref >60)
GLUCOSE: 97 mg/dL (ref 65–99)
Potassium: 3.8 mmol/L (ref 3.5–5.1)
Sodium: 137 mmol/L (ref 135–145)
TOTAL PROTEIN: 7.2 g/dL (ref 6.5–8.1)

## 2016-12-27 LAB — CBC WITH DIFFERENTIAL/PLATELET
Basophils Absolute: 0 10*3/uL (ref 0.0–0.1)
Basophils Relative: 0 %
EOS PCT: 2 %
Eosinophils Absolute: 0.2 10*3/uL (ref 0.0–0.7)
HEMATOCRIT: 45.6 % (ref 39.0–52.0)
Hemoglobin: 15.3 g/dL (ref 13.0–17.0)
LYMPHS ABS: 1.9 10*3/uL (ref 0.7–4.0)
LYMPHS PCT: 23 %
MCH: 29.9 pg (ref 26.0–34.0)
MCHC: 33.6 g/dL (ref 30.0–36.0)
MCV: 89.2 fL (ref 78.0–100.0)
MONO ABS: 0.4 10*3/uL (ref 0.1–1.0)
MONOS PCT: 5 %
NEUTROS ABS: 5.8 10*3/uL (ref 1.7–7.7)
Neutrophils Relative %: 70 %
PLATELETS: 170 10*3/uL (ref 150–400)
RBC: 5.11 MIL/uL (ref 4.22–5.81)
RDW: 13.8 % (ref 11.5–15.5)
WBC: 8.3 10*3/uL (ref 4.0–10.5)

## 2016-12-27 LAB — TROPONIN I: Troponin I: <0.03 ng/mL (ref <0.03)

## 2016-12-27 LAB — LIPASE, BLOOD: Lipase: 47 U/L (ref 11–51)

## 2016-12-27 LAB — CK: CK TOTAL: 1937 U/L — AB (ref 49–397)

## 2016-12-27 MED ORDER — HEPARIN SODIUM (PORCINE) 5000 UNIT/ML IJ SOLN
5000.0000 [IU] | Freq: Three times a day (TID) | INTRAMUSCULAR | Status: DC
  Administered 2016-12-28 – 2016-12-29 (×5): 5000 [IU] via SUBCUTANEOUS
  Filled 2016-12-27 (×5): qty 1

## 2016-12-27 MED ORDER — ONDANSETRON HCL 4 MG/2ML IJ SOLN: 4.0000 mg | Freq: Four times a day (QID) | INTRAMUSCULAR | Status: DC | PRN

## 2016-12-27 MED ORDER — GABAPENTIN 300 MG PO CAPS
600.0000 mg | ORAL_CAPSULE | Freq: Two times a day (BID) | ORAL | Status: DC
  Administered 2016-12-28 – 2016-12-29 (×4): 600 mg via ORAL
  Filled 2016-12-27 (×2): qty 2
  Filled 2016-12-27: qty 6
  Filled 2016-12-27: qty 2

## 2016-12-27 MED ORDER — ATORVASTATIN CALCIUM 20 MG PO TABS
20.0000 mg | ORAL_TABLET | Freq: Every day | ORAL | Status: DC
  Filled 2016-12-27: qty 1

## 2016-12-27 MED ORDER — INSULIN ASPART 100 UNIT/ML ~~LOC~~ SOLN
0.0000 [IU] | Freq: Three times a day (TID) | SUBCUTANEOUS | Status: DC
  Administered 2016-12-28: 1 [IU] via SUBCUTANEOUS

## 2016-12-27 MED ORDER — SODIUM CHLORIDE 0.9 % IV BOLUS (SEPSIS)
1000.0000 mL | Freq: Once | INTRAVENOUS | Status: AC
  Administered 2016-12-27: 1000 mL via INTRAVENOUS

## 2016-12-27 MED ORDER — HYDROCODONE-ACETAMINOPHEN 5-325 MG PO TABS
1.0000 | ORAL_TABLET | Freq: Four times a day (QID) | ORAL | Status: DC | PRN
  Administered 2016-12-28: 1 via ORAL
  Filled 2016-12-27: qty 1

## 2016-12-27 MED ORDER — SODIUM CHLORIDE 0.9 % IV SOLN
INTRAVENOUS | Status: DC
  Administered 2016-12-28 (×2): via INTRAVENOUS

## 2016-12-27 MED ORDER — INSULIN DETEMIR 100 UNIT/ML ~~LOC~~ SOLN
49.0000 [IU] | Freq: Every day | SUBCUTANEOUS | Status: DC
  Administered 2016-12-28 (×2): 49 [IU] via SUBCUTANEOUS
  Filled 2016-12-27 (×5): qty 0.49

## 2016-12-27 MED ORDER — LORATADINE 10 MG PO TABS
10.0000 mg | ORAL_TABLET | Freq: Every day | ORAL | Status: DC
  Administered 2016-12-28 – 2016-12-29 (×2): 10 mg via ORAL
  Filled 2016-12-27 (×2): qty 1

## 2016-12-27 MED ORDER — ONDANSETRON HCL 4 MG PO TABS: 4.0000 mg | ORAL_TABLET | Freq: Four times a day (QID) | ORAL | Status: DC | PRN

## 2016-12-27 NOTE — ED Provider Notes (Signed)
[facility] DEPT Provider Note   CSN: [id number] Arrival date & time: 12/27/16  1916     History   Chief Complaint Chief Complaint  Patient presents with  . Near Syncope    HPI Daniel Nicholson is a 46 y.o. male.  HPI  Pt was seen at 1940. 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 EMS states pt was hypotensive on scene and given IVF en route. Pt feels "a little better" after receiving IVF. CBG 143 per EMS. Pt also c/o acute flair of his chronic neck "pain" today.  Denies any change in his usual chronic pain pattern.  Pain worsens with palpation of the area and body position changes. Denies incont/retention of bowel or bladder, no saddle anesthesia, no focal motor weakness, no tingling/numbness in extremities, no fevers, no injury, no abd pain, no CP/SOB, no abd pain, no N/V/D.      Past Medical History:  Diagnosis Date  . Acid reflux   . Diabetes mellitus without complication (HCC)   . Gout   . Hemorrhoids    s/p right posterior, left lateral, right anterior banding. Last banding procedure Jan 2015.   [name] Hypertension   . Neck pain   . Poor circulation   . S/P tooth extraction 08/16/2016   three teeth extracted  . Tremors of nervous system     Patient Active Problem List   Diagnosis Date Noted  . Fatty liver 12/05/2012  . Rectal bleeding 12/05/2012  . Gross hematuria 12/05/2012  . 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 Acid reflux   . Gout   . FOOT PAIN 01/30/2008    Past Surgical History:  Procedure Laterality Date  . COLONOSCOPY N/A 12/16/2012   Dr. Jena Gauss- internal hemorrhoids otherwise normal. Screening in 2024.   [name] HEMORRHOID BANDING    . None         Home Medications    Prior to Admission medications   Medication Sig Start Date End Date Taking? Authorizing Provider  acetaminophen (TYLENOL 8 HOUR) 650 MG CR  tablet Take 1,300 mg by mouth daily.   Yes [provider]  allopurinol (ZYLOPRIM) 300 MG tablet Take 300 mg by mouth every morning.    Yes [provider]  atorvastatin (LIPITOR) 20 MG tablet Take 20 mg by mouth daily.   Yes [provider]  colchicine 0.6 MG tablet Take 0.6 mg by mouth every morning.    Yes [provider]  gabapentin (NEURONTIN) 300 MG capsule Take 600 mg by mouth 2 (two) times daily.   Yes [provider]  insulin detemir (LEVEMIR) 100 UNIT/ML injection Inject 49 Units into the skin at bedtime.    Yes [provider]  linagliptin (TRADJENTA) 5 MG TABS tablet Take 5 mg by mouth daily.   Yes [provider]  lisinopril-hydrochlorothiazide (PRINZIDE,ZESTORETIC) 10-12.5 MG per tablet Take 1 tablet by mouth every morning.    Yes [provider]  loratadine (CLARITIN) 10 MG tablet Take 10 mg by mouth daily.   Yes [provider]  metFORMIN (GLUCOPHAGE) 500 MG tablet Take 2 tablets (1,000 mg total) by mouth 2 (two) times daily. Patient taking 2 tablets in the morning and 1 tablet in the evening. 02/05/14  Yes Gerhard Munch, MD  amoxicillin (AMOXIL) 500 MG capsule Take 1 capsule (500 mg total) by mouth 3 (three) times daily. Patient not taking: Reported  on 12/27/2016 12/09/16   Ivery Quale, PA-C  Sunscreens (BLISTEX LIP BALM) 2-2.5-6.6 % STCK Apply to lips 4 times daily. 12/09/16   Ivery Quale, PA-C    Family History Family History  Problem Relation Age of Onset  . Hypertension Mother   . Arthritis Unknown   . Colon cancer Neg Hx     Social History Social History  Substance Use Topics  . Smoking status: Current Every Day Smoker    Packs/day: 0.50    Years: 30.00    Types: Cigarettes  . Smokeless tobacco: Never Used     Comment: Smokes 6-7 cigarettes daily  . Alcohol use 0.0 oz/week     Comment: occ. beer but rare      Allergies   Patient has no known allergies.   Review of  Systems Review of Systems ROS: Statement: All systems negative except as marked or noted in the HPI; Constitutional: Negative for fever and chills. ; ; Eyes: Negative for eye pain, redness and discharge. ; ; ENMT: Negative for ear pain, hoarseness, nasal congestion, sinus pressure and sore throat. ; ; Cardiovascular: Negative for chest pain, palpitations, diaphoresis, dyspnea and peripheral edema. ; ; Respiratory: Negative for cough, wheezing and stridor. ; ; Gastrointestinal: Negative for nausea, vomiting, diarrhea, abdominal pain, blood in stool, hematemesis, jaundice and rectal bleeding. . ; ; Genitourinary: Negative for dysuria, flank pain and hematuria. ; ; Musculoskeletal: Negative for back pain, +chronic neck pain. Negative for swelling and trauma.; ; Skin: Negative for pruritus, rash, abrasions, blisters, bruising and skin lesion.; ; Neuro: +lightheadedness, generalized weakness. Negative for headache and neck stiffness. Negative for altered level of consciousness, altered mental status, extremity weakness, paresthesias, involuntary movement, seizure and syncope.       Physical Exam Updated Vital Signs BP 94/70   Pulse 81   Temp 98 F (36.7 C) (Oral)   Resp 19   Ht  (1.93 m)   Wt 114.8 kg (253 lb)   SpO2 100%   BMI 30.80 kg/m    Patient Vitals for the past 24 hrs:  BP Temp Temp src Pulse Resp SpO2 Height Weight  12/27/16 2143 114/76 - - 86 18 100 % - -  12/27/16 2107 104/75 - - 88 18 100 % - -  12/27/16 2018 97/82 - - 89 19 97 % - -  12/27/16 1930 94/70 98 F (36.7 C) Oral 81 19 100 % - -  12/27/16 1919 114/76 - - 86 18 100 %  (1.93 m) 114.8 kg (253 lb)     19:59 Orthostatic Vital Signs LN  Orthostatic Lying   BP- Lying: 90/64  Pulse- Lying: 83      Orthostatic Sitting  BP- Sitting: 99/71  Pulse- Sitting: 79      Orthostatic Standing at 0 minutes  BP- Standing at 0 minutes: 97/82  Pulse- Standing at 0 minutes: 94    Physical Exam 1945: Physical  examination:  Nursing notes reviewed; Vital signs and O2 SAT reviewed;  Constitutional: Well developed, Well nourished, In no acute distress; Head:  Normocephalic, atraumatic; Eyes: EOMI, PERRL, No scleral icterus; ENMT: Mouth and pharynx normal, Mucous membranes dry; Neck: Supple, Full range of motion, No lymphadenopathy; Cardiovascular: Regular rate and rhythm, No gallop; Respiratory: Breath sounds clear & equal bilaterally, No wheezes.  Speaking full sentences with ease, Normal respiratory effort/excursion; Chest: Nontender, Movement normal; Abdomen: Soft, Nontender, Nondistended, Normal bowel sounds; Genitourinary: No CVA tenderness; Spine:  No midline CS, TS, LS tenderness.;; Extremities: Pulses  normal, No tenderness, No edema, No calf edema or asymmetry.; Neuro: AA&Ox3, Major CN grossly intact.  Speech clear. No gross focal motor or sensory deficits in extremities.; Skin: Color normal, Warm, Dry.   ED Treatments / Results  Labs (all labs ordered are listed, but only abnormal results are displayed)   EKG  EKG Interpretation  Date/Time:  Wednesday December 27 2016 19:30:29 EDT Ventricular Rate:  78 PR Interval:    QRS Duration: 92 QT Interval:  401 QTC Calculation: 457 R Axis:   28 Text Interpretation:  Sinus rhythm Abnormal R-wave progression, early transition When compared with ECG of 09/15/2015 No significant change was found Confirmed by Samuel Jester [phone]) on 12/27/2016 7:47:21 PM       Radiology   Procedures Procedures (including critical care time)  Medications Ordered in ED Medications  sodium chloride 0.9 % bolus 1,000 mL (not administered)     Initial Impression / Assessment and Plan / ED Course  I have reviewed the triage vital signs and the nursing notes.  Pertinent labs & imaging results that were available during my care of the patient were reviewed by me and considered in my medical decision making (see chart for details).  MDM Reviewed: previous chart,  nursing note and vitals Reviewed previous: labs and ECG Interpretation: labs, ECG, CT scan and x-ray    Results for orders placed or performed during the hospital encounter of 12/27/16  Comprehensive metabolic panel  Result Value Ref Range   Sodium 137 135 - 145 mmol/L   Potassium 3.8 3.5 - 5.1 mmol/L   Chloride 101 101 - 111 mmol/L   CO2 27 22 - 32 mmol/L   Glucose, Bld 97 65 - 99 mg/dL   BUN 17 6 - 20 mg/dL   Creatinine, Ser 6.04 (H) 0.61 - 1.24 mg/dL   Calcium 9.1 8.9 - 54.0 mg/dL   Total Protein 7.2 6.5 - 8.1 g/dL   Albumin 4.7 3.5 - 5.0 g/dL   AST 58 (H) 15 - 41 U/L   ALT 41 17 - 63 U/L   Alkaline Phosphatase 34 (L) 38 - 126 U/L   Total Bilirubin 0.9 0.3 - 1.2 mg/dL   GFR calc non Af Amer 34 (L) >60 mL/min   GFR calc Af Amer 39 (L) >60 mL/min   Anion gap 9 5 - 15  Troponin I  Result Value Ref Range   Troponin I <0.03 <0.03 ng/mL  Lipase, blood  Result Value Ref Range   Lipase 47 11 - 51 U/L  CK  Result Value Ref Range   Total CK 1,937 (H) 49 - 397 U/L  CBC with Differential  Result Value Ref Range   WBC 8.3 4.0 - 10.5 K/uL   RBC 5.11 4.22 - 5.81 MIL/uL   Hemoglobin 15.3 13.0 - 17.0 g/dL   HCT 98.1 19.1 - 47.8 %   MCV 89.2 78.0 - 100.0 fL   MCH 29.9 26.0 - 34.0 pg   MCHC 33.6 30.0 - 36.0 g/dL   RDW 29.5 62.1 - 30.8 %   Platelets 170 150 - 400 K/uL   Neutrophils Relative % 70 %   Neutro Abs 5.8 1.7 - 7.7 K/uL   Lymphocytes Relative 23 %   Lymphs Abs 1.9 0.7 - 4.0 K/uL   Monocytes Relative 5 %   Monocytes Absolute 0.4 0.1 - 1.0 K/uL   Eosinophils Relative 2 %   Eosinophils Absolute 0.2 0.0 - 0.7 K/uL   Basophils Relative 0 %  Basophils Absolute 0.0 0.0 - 0.1 K/uL   Dg Chest 2 View Result Date: 12/27/2016 CLINICAL DATA:  Patient states generalized weakness and near syncopal episodes. Hx of diabetes, HTN, current smoker. EXAM: CHEST  2 VIEW COMPARISON:  Chest x-ray dated 10/10/2009. FINDINGS: Cardiomediastinal silhouette is stable. Lungs are clear. No  pleural effusion or pneumothorax seen. No acute or suspicious osseous finding. IMPRESSION: No active cardiopulmonary disease. No evidence of pneumonia or pulmonary edema. Electronically Signed   By: Bary Richard M.D.   On: 12/27/2016 21:03   Ct Head Wo Contrast Result Date: 12/27/2016 CLINICAL DATA:  Headache and neck pain; c/o almost syncopal episode; pt has been working outside all day but states he has been drinking water; ems reports pt was hypotensive on scene and given bolus EXAM: CT HEAD WITHOUT CONTRAST CT CERVICAL SPINE WITHOUT CONTRAST TECHNIQUE: Multidetector CT imaging of the head and cervical spine was performed following the standard protocol without intravenous contrast. Multiplanar CT image reconstructions of the cervical spine were also generated. COMPARISON:  Head CT dated 11/10/2004. FINDINGS: CT HEAD FINDINGS Brain: Ventricles are normal in size and configuration. All areas of the brain demonstrate normal gray-white matter attenuation. There is no mass, hemorrhage, edema or other evidence of acute parenchymal abnormality. No extra-axial hemorrhage. Vascular: No hyperdense vessel or unexpected calcification. Skull: Normal. Negative for fracture or focal lesion. Sinuses/Orbits: Chronic appearing mucosal thickening within the frontal sinus and maxillary sinuses. Additional mucosal thickening throughout the ethmoid air cells, of uncertain chronicity. Periorbital and retro-orbital soft tissues are unremarkable. Other: None. CT CERVICAL SPINE FINDINGS Alignment: Slight reversal of the normal cervical spine lordosis. No evidence of acute vertebral body subluxation. Skull base and vertebrae: No fracture line or displaced fracture fragment. No acute appearing cortical irregularity or osseous lesion. Facet joints appear intact and normally aligned throughout. Soft tissues and spinal canal: No prevertebral fluid or swelling. No visible canal hematoma. Disc levels: Mild disc desiccations at the  C4-5 through C6-7 levels, with associated mild disc bulges, causing mild to moderate central canal stenoses. Upper chest: Negative. Other: None. IMPRESSION: 1. No acute intracranial abnormality. No intracranial mass, hemorrhage or edema. 2. Paranasal sinus disease, most of which appears chronic, ethmoid sinus disease is of uncertain age. 3. No acute findings within the cervical spine. 4. 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 5. Slight reversal of the normal cervical spine lordosis is likely related to the underlying degenerative changes but may be accentuated by patient positioning or muscle spasm. Electronically Signed   By: Bary Richard M.D.   On: 12/27/2016 21:11   Ct Cervical Spine Wo Contrast Result Date: 12/27/2016 CLINICAL DATA:  Headache and neck pain; c/o almost syncopal episode; pt has been working outside all day but states he has been drinking water; ems reports pt was hypotensive on scene and given bolus EXAM: CT HEAD WITHOUT CONTRAST CT CERVICAL SPINE WITHOUT CONTRAST TECHNIQUE: Multidetector CT imaging of the head and cervical spine was performed following the standard protocol without intravenous contrast. Multiplanar CT image reconstructions of the cervical spine were also generated. COMPARISON:  Head CT dated 11/10/2004. FINDINGS: CT HEAD FINDINGS Brain: Ventricles are normal in size and configuration. All areas of the brain demonstrate normal gray-white matter attenuation. There is no mass, hemorrhage, edema or other evidence of acute parenchymal abnormality. No extra-axial hemorrhage. Vascular: No hyperdense vessel or unexpected calcification. Skull: Normal. Negative for fracture or focal lesion.  Sinuses/Orbits: Chronic appearing mucosal thickening within the frontal sinus and maxillary sinuses. Additional mucosal thickening throughout the ethmoid air cells, of uncertain chronicity.  Periorbital and retro-orbital soft tissues are unremarkable. Other: None. CT CERVICAL SPINE FINDINGS Alignment: Slight reversal of the normal cervical spine lordosis. No evidence of acute vertebral body subluxation. Skull base and vertebrae: No fracture line or displaced fracture fragment. No acute appearing cortical irregularity or osseous lesion. Facet joints appear intact and normally aligned throughout. Soft tissues and spinal canal: No prevertebral fluid or swelling. No visible canal hematoma. Disc levels: Mild disc desiccations at the C4-5 through C6-7 levels, with associated mild disc bulges, causing mild to moderate central canal stenoses. Upper chest: Negative. Other: None. IMPRESSION: 1. No acute intracranial abnormality. No intracranial mass, hemorrhage or edema. 2. Paranasal sinus disease, most of which appears chronic, ethmoid sinus disease is of uncertain age. 3. No acute findings within the cervical spine. 4. 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 5. Slight reversal of the normal cervical spine lordosis is likely related to the underlying degenerative changes but may be accentuated by patient positioning or muscle spasm. Electronically Signed   By: Bary Richard M.D.   On: 12/27/2016 21:11   Results for DOVER, HEAD (MRN [id number]) as of 12/27/2016 21:52  Ref. Range 12/15/2013 21:59 02/05/2014 16:50 12/27/2016 20:52  BUN Latest Ref Range: 6 - 20 mg/dL 13 23 17   Creatinine Latest Ref Range: 0.61 - 1.24 mg/dL 4.09 8.11 (H) 9.14 (H)    2155:    Orthostatic on initial VS. IVF NS x2L bolus given with slow improvement in BP.  New AKI on labs with elevated CK; will continue IVF, observation admit. Dx and testing d/w pt and family.  Questions answered.  Verb understanding, agreeable to admit.   T/C to [facility] Dr. Sharl Ma, case discussed, including:  HPI, pertinent PM/SHx, VS/PE, dx testing, ED course and  treatment:  Agreeable to admit.   Final Clinical Impressions(s) / ED Diagnoses   Final diagnoses:  None    New Prescriptions New Prescriptions   No medications on file     Samuel Jester, DO 01/01/17 1218

## 2016-12-27 NOTE — ED Notes (Signed)
Checked with patient for urine sample,pt can't go right now will check back in 30 minutes.

## 2016-12-27 NOTE — ED Triage Notes (Signed)
Pt brought in by [facility] for c/o almost syncopal episode; pt has been working outside all day but states he has been drinking water; ems reports pt was hypotensive on scene and given bolus; pt c/o neck pain; cbg 143

## 2016-12-27 NOTE — ED Notes (Signed)
Checked back with patient for urine sample,pt still can't go right now.,will try back in 30 minutes.

## 2016-12-27 NOTE — H&P (Signed)
TRH H&P    Patient Demographics:    Daniel Nicholson, is a 46 y.o. male  MRN: [id number]  DOB - 05-22-1970  Admit Date - 12/27/2016  Referring MD/NP/PA: Dr. Clarene Duke  Outpatient Primary MD for the patient is Daniel Bender, MD  Patient coming from: Home  Chief Complaint  Patient presents with  . Near Syncope      HPI:    Daniel Nicholson  is a 46 y.o. male, With history of gout, diabetes mellitus, hypertension who came to hospital with complaints of generalized weakness and lightheadedness which started around 6 PM in the evening. 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 He denies passing out. Denies chest pain or shortness of breath. He has been drinking water throughout the day. Patient does have chronic neck pain. He denies any focal motor weakness but complains of tingling in the right second toe.  In the ED lab work showed CK 1937, creatinine 2.22.    Review of systems:    In addition to the HPI above,  No problems swallowing food or Liquids,h, No Abdominal pain, No Nausea or Vomiting, bowel movements are regular, No Blood in stool or Urine, No dysuria, No new skin rashes or bruises, No new joints pains-aches,  No recent weight gain or loss, No polyuria, polydypsia or polyphagia, No significant Mental Stressors.  All other systems reviewed and are negative.   With Past History of the following :    Past Medical History:  Diagnosis Date  . Acid reflux   . Diabetes mellitus without complication (HCC)   . Gout   . Hemorrhoids    s/p right posterior, left lateral, right anterior banding. Last banding procedure Jan 2015.   [name] Hypertension   . Neck pain   . Poor circulation   . S/P tooth extraction 08/16/2016   three teeth extracted  . Tremors of nervous system       Past Surgical History:  Procedure Laterality Date  . COLONOSCOPY N/A 12/16/2012   Dr. Jena Gauss- internal hemorrhoids otherwise normal. Screening in 2024.   [name] HEMORRHOID BANDING    . None        Social History:      Social History  Substance Use Topics  . Smoking status: Current Every Day Smoker    Packs/day: 0.50    Years: 30.00    Types: Cigarettes  . Smokeless tobacco: Never Used     Comment: Smokes 6-7 cigarettes daily  . Alcohol use 0.0 oz/week     Comment: occ. beer but rare        Family History :     Family History  Problem Relation Age of Onset  . Hypertension Mother   . Arthritis Unknown   . Colon cancer Neg Hx       Home Medications:   Prior to Admission medications   Medication Sig Start Date End Date Taking? Authorizing Provider  acetaminophen (TYLENOL 8 HOUR) 650 MG CR tablet Take 1,300 mg by mouth daily.   Yes [provider]  allopurinol (ZYLOPRIM) 300 MG tablet Take 300 mg by mouth every morning.    Yes [provider]  atorvastatin (LIPITOR) 20 MG tablet Take 20 mg by mouth daily.   Yes [provider]  colchicine 0.6 MG tablet Take 0.6 mg by mouth every morning.    Yes [provider]  gabapentin (NEURONTIN) 300 MG capsule Take 600 mg by mouth 2 (two) times daily.   Yes [provider]  insulin detemir (LEVEMIR) 100 UNIT/ML injection Inject 49 Units into the skin at bedtime.    Yes [provider]  linagliptin (TRADJENTA) 5 MG TABS tablet Take 5 mg by mouth daily.   Yes [provider]  lisinopril-hydrochlorothiazide (PRINZIDE,ZESTORETIC) 10-12.5 MG per tablet Take 1 tablet by mouth every morning.    Yes [provider]  loratadine (CLARITIN) 10 MG tablet Take 10 mg by mouth daily.   Yes [provider]  metFORMIN (GLUCOPHAGE) 500 MG tablet Take 2 tablets (1,000 mg total) by mouth 2 (two) times daily. Patient taking 2 tablets in the morning and 1 tablet in the evening. 02/05/14  Yes Gerhard Munch, MD  amoxicillin (AMOXIL) 500 MG capsule Take 1 capsule  (500 mg total) by mouth 3 (three) times daily. Patient not taking: Reported on 12/27/2016 12/09/16   Ivery Quale, PA-C  Sunscreens (BLISTEX LIP BALM) 2-2.5-6.6 % STCK Apply to lips 4 times daily. 12/09/16   Ivery Quale, PA-C     Allergies:    No Known Allergies   Physical Exam:   Vitals  Blood pressure 114/76, pulse 86, temperature 98 F (36.7 C), temperature source Oral, resp. rate 18, height  (1.93 m), weight 114.8 kg (253 lb), SpO2 100 %.  1.  General: Appears in no acute distress  2. Psychiatric:  Intact judgement and  insight, awake alert, oriented x 3.  3. Neurologic: No focal neurological deficits, all cranial nerves intact.Strength 5/5 all 4 extremities, sensation intact all 4 extremities, plantars down going.  4. Eyes :  anicteric sclerae, moist conjunctivae with no lid lag. PERRLA.  5. ENMT:  Oropharynx clear with moist mucous membranes and good dentition  6. Neck:  supple, no cervical lymphadenopathy appriciated, No thyromegaly  7. Respiratory : Normal respiratory effort, good air movement bilaterally,clear to  auscultation bilaterally  8. Cardiovascular : RRR, no gallops, rubs or murmurs, no leg edema  9. Gastrointestinal:  Positive bowel sounds, abdomen soft, non-tender to palpation,no hepatosplenomegaly, no rigidity or guarding       10. Skin:  No cyanosis, normal texture and turgor, no rash, lesions or ulcers  11.Musculoskeletal:  Good muscle tone,  joints appear normal , no effusions,  normal range of motion    Data Review:    CBC  Recent Labs Lab 12/27/16 2052  WBC 8.3  HGB 15.3  HCT 45.6  PLT 170  MCV 89.2  MCH 29.9  MCHC 33.6  RDW 13.8  LYMPHSABS 1.9  MONOABS 0.4  EOSABS 0.2  BASOSABS 0.0   ------------------------------------------------------------------------------------------------------------------  Chemistries   Recent Labs Lab 12/27/16 2052  NA 137  K 3.8  CL 101  CO2 27  GLUCOSE 97  BUN 17  CREATININE  2.22*  CALCIUM 9.1  AST 58*  ALT 41  ALKPHOS 34*  BILITOT 0.9   ------------------------------------------------------------------------------------------------------------------  ------------------------------------------------------------------------------------------------------------------ GFR: Estimated Creatinine Clearance: 57.6 mL/min (A) (by C-G formula based on SCr of 2.22 mg/dL (H)). Liver Function Tests:  Recent Labs Lab 12/27/16 2052  AST 58*  ALT 41  ALKPHOS 34*  BILITOT 0.9  PROT 7.2  ALBUMIN 4.7    Recent Labs Lab 12/27/16 2052  LIPASE 47   No results for input(s): AMMONIA in the last 168 hours. Coagulation Profile: No results for input(s): INR, PROTIME in the last 168 hours. Cardiac Enzymes:  Recent Labs Lab 12/27/16 2052  CKTOTAL 1,937*  TROPONINI <0.03    --------------------------------------------------------------------------------------------------------------- Urine analysis:    Component Value Date/Time   COLORURINE YELLOW 02/05/2014 1900   APPEARANCEUR CLEAR 02/05/2014 1900   LABSPEC 1.010 02/05/2014 1900   PHURINE 5.5 02/05/2014 1900   GLUCOSEU >1000 (A) 02/05/2014 1900   HGBUR NEGATIVE 02/05/2014 1900   BILIRUBINUR NEGATIVE 02/05/2014 1900   KETONESUR TRACE (A) 02/05/2014 1900   PROTEINUR NEGATIVE 02/05/2014 1900   UROBILINOGEN 0.2 02/05/2014 1900   NITRITE NEGATIVE 02/05/2014 1900   LEUKOCYTESUR NEGATIVE 02/05/2014 1900      Imaging Results:    Dg Chest 2 View  Result Date: 12/27/2016 CLINICAL DATA:  Patient states generalized weakness and near syncopal episodes. Hx of diabetes, HTN, current smoker. EXAM: CHEST  2 VIEW COMPARISON:  Chest x-ray dated 10/10/2009. FINDINGS: Cardiomediastinal silhouette is stable. Lungs are clear. No pleural effusion or pneumothorax seen. No acute or suspicious osseous finding. IMPRESSION: No active cardiopulmonary disease. No evidence of pneumonia or pulmonary edema. Electronically Signed   By:  Bary Richard M.D.   On: 12/27/2016 21:03   Ct Head Wo Contrast  Result Date: 12/27/2016 CLINICAL DATA:  Headache and neck pain; c/o almost syncopal episode; pt has been working outside all day but states he has been drinking water; ems reports pt was hypotensive on scene and given bolus EXAM: CT HEAD WITHOUT CONTRAST CT CERVICAL SPINE WITHOUT CONTRAST TECHNIQUE: Multidetector CT imaging of the head and cervical spine was performed following the standard protocol without intravenous contrast. Multiplanar CT image reconstructions of the cervical spine were also generated. COMPARISON:  Head CT dated 11/10/2004. FINDINGS: CT HEAD FINDINGS Brain: Ventricles are normal in size and configuration. All areas of the brain demonstrate normal gray-white matter attenuation. There is no mass, hemorrhage, edema or other evidence of acute parenchymal abnormality. No extra-axial hemorrhage. Vascular: No hyperdense vessel or unexpected calcification. Skull: Normal. Negative for fracture or focal lesion. Sinuses/Orbits: Chronic appearing mucosal thickening within the frontal sinus and maxillary sinuses. Additional mucosal thickening throughout the ethmoid air cells, of uncertain chronicity. Periorbital and retro-orbital soft tissues are unremarkable. Other: None. CT CERVICAL SPINE FINDINGS Alignment: Slight reversal of the normal cervical spine lordosis. No evidence of acute vertebral body subluxation. Skull base and vertebrae: No fracture line or displaced fracture fragment. No acute appearing cortical irregularity or osseous lesion. Facet joints appear intact and normally aligned throughout. Soft tissues and spinal canal: No prevertebral fluid or swelling. No visible canal hematoma. Disc levels: Mild disc desiccations at the C4-5 through C6-7 levels, with associated mild disc bulges, causing mild to moderate central canal stenoses. Upper chest: Negative. Other: None. IMPRESSION: 1. No acute intracranial abnormality. No  intracranial mass, hemorrhage or edema. 2. Paranasal sinus disease, most of which appears chronic, ethmoid sinus disease is of uncertain age. 3. No acute findings within the cervical spine. 4. 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 5. Slight reversal of the normal cervical spine lordosis is likely related to the underlying degenerative changes but may be accentuated by patient positioning or muscle spasm. Electronically Signed   By: Anne Ng.D.  On: 12/27/2016 21:11   Ct Cervical Spine Wo Contrast  Result Date: 12/27/2016 CLINICAL DATA:  Headache and neck pain; c/o almost syncopal episode; pt has been working outside all day but states he has been drinking water; ems reports pt was hypotensive on scene and given bolus EXAM: CT HEAD WITHOUT CONTRAST CT CERVICAL SPINE WITHOUT CONTRAST TECHNIQUE: Multidetector CT imaging of the head and cervical spine was performed following the standard protocol without intravenous contrast. Multiplanar CT image reconstructions of the cervical spine were also generated. COMPARISON:  Head CT dated 11/10/2004. FINDINGS: CT HEAD FINDINGS Brain: Ventricles are normal in size and configuration. All areas of the brain demonstrate normal gray-white matter attenuation. There is no mass, hemorrhage, edema or other evidence of acute parenchymal abnormality. No extra-axial hemorrhage. Vascular: No hyperdense vessel or unexpected calcification. Skull: Normal. Negative for fracture or focal lesion. Sinuses/Orbits: Chronic appearing mucosal thickening within the frontal sinus and maxillary sinuses. Additional mucosal thickening throughout the ethmoid air cells, of uncertain chronicity. Periorbital and retro-orbital soft tissues are unremarkable. Other: None. CT CERVICAL SPINE FINDINGS Alignment: Slight reversal of the normal cervical spine lordosis. No evidence of acute vertebral  body subluxation. Skull base and vertebrae: No fracture line or displaced fracture fragment. No acute appearing cortical irregularity or osseous lesion. Facet joints appear intact and normally aligned throughout. Soft tissues and spinal canal: No prevertebral fluid or swelling. No visible canal hematoma. Disc levels: Mild disc desiccations at the C4-5 through C6-7 levels, with associated mild disc bulges, causing mild to moderate central canal stenoses. Upper chest: Negative. Other: None. IMPRESSION: 1. No acute intracranial abnormality. No intracranial mass, hemorrhage or edema. 2. Paranasal sinus disease, most of which appears chronic, ethmoid sinus disease is of uncertain age. 3. No acute findings within the cervical spine. 4. 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 5. Slight reversal of the normal cervical spine lordosis is likely related to the underlying degenerative changes but may be accentuated by patient positioning or muscle spasm. Electronically Signed   By: Bary Richard M.D.   On: 12/27/2016 21:11    My personal review of EKG: Rhythm NSR   Assessment & Plan:    Active Problems:   Rhabdomyolysis   AKI (acute kidney injury) (HCC)   1. Rhabdomyolysis- mild, CK elevated to 1937 also has mild elevation of AST 58. Patient started on IV normal saline in the ED. Continue aggressive IV hydration. Will recheck CK in a.m. 2. Acute kidney injury- patient creatinine is 2.2, BUN is only 17. Not sure whether patient has underlying CK D, his previous creatinine from 2015 was 1.37. Patient started on IV normal saline. Follow BMP in a.m. Will hold Zestoretic. 3. Diabetes mellitus- continue Levemir, hold metformin. Will start sliding scale insulin with NovoLog. 4. Chronic neck pain- CT cervical spine shows chronic degenerative disc disease.*Vicodin when necessary for pain. 5. History of gout-no flareup, hold  colchicine and allopurinol due to renal insufficiency.   DVT Prophylaxis-  Heparin  AM Labs Ordered, also please review Full Orders  Family Communication: Admission, patients condition and plan of care including tests being ordered have been discussed with the patient and his girlfriend at bedside* who indicate understanding and agree with the plan and Code Status.  Code Status:  Full code  Admission status: Observation    Time spent in minutes : 60 minutes   LAMA,GAGAN S M.D on 12/27/2016 at 10:25 PM  Between  7am to 7pm - Pager - [phone]. After 7pm go to [email or web address] - password [facility]  [facility] - Office  [phone]

## 2016-12-28 ENCOUNTER — Encounter (HOSPITAL_COMMUNITY): Payer: Self-pay

## 2016-12-28 DIAGNOSIS — R55 Syncope and collapse: Secondary | ICD-10-CM | POA: Diagnosis not present

## 2016-12-28 DIAGNOSIS — M5030 Other cervical disc degeneration, unspecified cervical region: Secondary | ICD-10-CM | POA: Diagnosis present

## 2016-12-28 DIAGNOSIS — F17210 Nicotine dependence, cigarettes, uncomplicated: Secondary | ICD-10-CM | POA: Diagnosis present

## 2016-12-28 DIAGNOSIS — M6282 Rhabdomyolysis: Secondary | ICD-10-CM | POA: Diagnosis present

## 2016-12-28 DIAGNOSIS — E119 Type 2 diabetes mellitus without complications: Secondary | ICD-10-CM

## 2016-12-28 DIAGNOSIS — Z794 Long term (current) use of insulin: Secondary | ICD-10-CM | POA: Diagnosis not present

## 2016-12-28 DIAGNOSIS — N179 Acute kidney failure, unspecified: Secondary | ICD-10-CM | POA: Diagnosis not present

## 2016-12-28 DIAGNOSIS — I10 Essential (primary) hypertension: Secondary | ICD-10-CM | POA: Diagnosis present

## 2016-12-28 DIAGNOSIS — Z79899 Other long term (current) drug therapy: Secondary | ICD-10-CM | POA: Diagnosis not present

## 2016-12-28 DIAGNOSIS — G8929 Other chronic pain: Secondary | ICD-10-CM | POA: Diagnosis present

## 2016-12-28 DIAGNOSIS — E860 Dehydration: Secondary | ICD-10-CM | POA: Diagnosis present

## 2016-12-28 DIAGNOSIS — I959 Hypotension, unspecified: Secondary | ICD-10-CM | POA: Diagnosis not present

## 2016-12-28 DIAGNOSIS — M109 Gout, unspecified: Secondary | ICD-10-CM | POA: Diagnosis present

## 2016-12-28 LAB — COMPREHENSIVE METABOLIC PANEL
ALT: 38 U/L (ref 17–63)
AST: 61 U/L — AB (ref 15–41)
Albumin: 3.9 g/dL (ref 3.5–5.0)
Alkaline Phosphatase: 33 U/L — ABNORMAL LOW (ref 38–126)
Anion gap: 10 (ref 5–15)
BUN: 18 mg/dL (ref 6–20)
CALCIUM: 8.8 mg/dL — AB (ref 8.9–10.3)
CHLORIDE: 105 mmol/L (ref 101–111)
CO2: 25 mmol/L (ref 22–32)
CREATININE: 1.27 mg/dL — AB (ref 0.61–1.24)
Glucose, Bld: 79 mg/dL (ref 65–99)
POTASSIUM: 3.5 mmol/L (ref 3.5–5.1)
Sodium: 140 mmol/L (ref 135–145)
TOTAL PROTEIN: 6.2 g/dL — AB (ref 6.5–8.1)
Total Bilirubin: 0.8 mg/dL (ref 0.3–1.2)

## 2016-12-28 LAB — HEMOGLOBIN A1C
Hgb A1c MFr Bld: 5.7 % — ABNORMAL HIGH (ref 4.8–5.6)
Mean Plasma Glucose: 116.89 mg/dL

## 2016-12-28 LAB — URINALYSIS, ROUTINE W REFLEX MICROSCOPIC
Bilirubin Urine: NEGATIVE
GLUCOSE, UA: NEGATIVE mg/dL
HGB URINE DIPSTICK: NEGATIVE
Ketones, ur: NEGATIVE mg/dL
LEUKOCYTES UA: NEGATIVE
Nitrite: NEGATIVE
Protein, ur: NEGATIVE mg/dL
SPECIFIC GRAVITY, URINE: 1.016 (ref 1.005–1.030)
pH: 5 (ref 5.0–8.0)

## 2016-12-28 LAB — GLUCOSE, CAPILLARY
GLUCOSE-CAPILLARY: 150 mg/dL — AB (ref 65–99)
GLUCOSE-CAPILLARY: 103 mg/dL — AB (ref 65–99)
GLUCOSE-CAPILLARY: 143 mg/dL — AB (ref 65–99)
Glucose-Capillary: 96 mg/dL (ref 65–99)

## 2016-12-28 LAB — CK: CK TOTAL: 2382 U/L — AB (ref 49–397)

## 2016-12-28 LAB — CBC
HCT: 43 % (ref 39.0–52.0)
Hemoglobin: 14.6 g/dL (ref 13.0–17.0)
MCH: 30.2 pg (ref 26.0–34.0)
MCHC: 34 g/dL (ref 30.0–36.0)
MCV: 89 fL (ref 78.0–100.0)
PLATELETS: 172 10*3/uL (ref 150–400)
RBC: 4.83 MIL/uL (ref 4.22–5.81)
RDW: 13.9 % (ref 11.5–15.5)
WBC: 8.3 10*3/uL (ref 4.0–10.5)

## 2016-12-28 LAB — CBG MONITORING, ED: Glucose-Capillary: 151 mg/dL — ABNORMAL HIGH (ref 65–99)

## 2016-12-28 MED ORDER — ALLOPURINOL 300 MG PO TABS
300.0000 mg | ORAL_TABLET | Freq: Every morning | ORAL | Status: DC
  Administered 2016-12-28 – 2016-12-29 (×2): 300 mg via ORAL
  Filled 2016-12-28 (×2): qty 1

## 2016-12-28 MED ORDER — COLCHICINE 0.6 MG PO TABS
0.6000 mg | ORAL_TABLET | Freq: Every morning | ORAL | Status: DC
  Administered 2016-12-28 – 2016-12-29 (×2): 0.6 mg via ORAL
  Filled 2016-12-28 (×2): qty 1

## 2016-12-28 MED ORDER — ACETAMINOPHEN 325 MG PO TABS: 650.0000 mg | ORAL_TABLET | ORAL | Status: DC | PRN

## 2016-12-28 NOTE — Progress Notes (Signed)
Cranston Neighbor called me @ 1516 today and asked about a gold necklace. I called the tech that performed the patient's exam and stated that she did not remove any jewelry from patient. That patient came from ED without any jewelry on.

## 2016-12-28 NOTE — Progress Notes (Addendum)
PROGRESS NOTE    Daniel Nicholson   ONG:[id number]  DOB: 02-04-1971  DOA: 12/27/2016 PCP: Gwenyth Bender, MD   Brief Narrative:  Daniel Nicholson 46 y.o. male, With history of gout, diabetes mellitus, hypertension who came to hospital with complaints of generalized weakness and lightheadedness starting around 6 PM.  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  Admitted for AKI and rhabdomyolysis   Subjective: No complaints today. Feels much more stronger today  ROS: no complaints of nausea, vomiting, constipation diarrhea, cough, dyspnea or dysuria. No other complaints.   Assessment & Plan:   Active Problems:   Rhabdomyolysis   AKI (acute kidney injury)   - AKI improved with IVF- but CPK has increased- cont IVF for renal protection and follow - hold Statin for now  HTN - hold Lisinopril HCTZ  DM 2 - Levemir and SSI - A1c well controlled at 5.7  Gout  - resume Allopurinol and Colchicine  DVT prophylaxis: heparin Code Status: Full code Family Communication:  Disposition Plan: home when stable Consultants:    Procedures:    Antimicrobials:  Anti-infectives    None       Objective: Vitals:   12/28/16 0054 12/28/16 0157 12/28/16 0550 12/28/16 1500  BP: 113/76 107/61 115/63 138/83  Pulse: 89 91 82 91  Resp: Temp:  98 F (36.7 C) 97.8 F (36.6 C) 97.9 F (36.6 C)  TempSrc:  Oral Oral Oral  SpO2: 98% 96% 95% 98%  Weight:  115.2 kg (254 lb)    Height:   (1.93 m)      Intake/Output Summary (Last 24 hours) at 12/28/16 1635 Last data filed at 12/28/16 1200  Gross per 24 hour  Intake          3002.92 ml  Output              800 ml  Net          2202.92 ml   Filed Weights   12/27/16 1919 12/28/16 0157  Weight: 114.8 kg (253 lb) 115.2 kg (254 lb)    Examination: General exam: Appears comfortable  HEENT: PERRLA, oral mucosa moist, no sclera icterus or thrush Respiratory system: Clear to  auscultation. Respiratory effort normal. Cardiovascular system: S1 & S2 heard, RRR.  No murmurs  Gastrointestinal system: Abdomen soft, non-tender, nondistended. Normal bowel sound. No organomegaly Central nervous system: Alert and oriented. No focal neurological deficits. Extremities: No cyanosis, clubbing or edema Skin: No rashes or ulcers Psychiatry:  Mood & affect appropriate.     Data Reviewed: I have personally reviewed following labs and imaging studies  CBC:  Recent Labs Lab 12/27/16 2052 12/28/16 0656  WBC 8.3 8.3  NEUTROABS 5.8  --   HGB 15.3 14.6  HCT 45.6 43.0  MCV 89.2 89.0  PLT 170 172   Basic Metabolic Panel:  Recent Labs Lab 12/27/16 2052 12/28/16 0656  NA 137 140  K 3.8 3.5  CL 101 105  CO2 27 25  GLUCOSE 97 79  BUN 17 18  CREATININE 2.22* 1.27*  CALCIUM 9.1 8.8*   GFR: Estimated Creatinine Clearance: 100.9 mL/min (A) (by C-G formula based on SCr of 1.27 mg/dL (H)). Liver Function Tests:  Recent Labs Lab 12/27/16 2052 12/28/16 0656  AST 58* 61*  ALT 41 38  ALKPHOS 34* 33*  BILITOT 0.9 0.8  PROT 7.2 6.2*  ALBUMIN 4.7 3.9    Recent Labs Lab  12/27/16 2052  LIPASE 47   No results for input(s): AMMONIA in the last 168 hours. Coagulation Profile: No results for input(s): INR, PROTIME in the last 168 hours. Cardiac Enzymes:  Recent Labs Lab 12/27/16 2052 12/28/16 0656  CKTOTAL 1,937* 2,382*  TROPONINI <0.03  --    BNP (last 3 results) No results for input(s): PROBNP in the last 8760 hours. HbA1C:  Recent Labs  12/27/16 2052  HGBA1C 5.7*   CBG:  Recent Labs Lab 12/28/16 0048 12/28/16 0751 12/28/16 1116 12/28/16 1628  GLUCAP 151* 96 150* 103*   Lipid Profile: No results for input(s): CHOL, HDL, LDLCALC, TRIG, CHOLHDL, LDLDIRECT in the last 72 hours. Thyroid Function Tests: No results for input(s): TSH, T4TOTAL, FREET4, T3FREE, THYROIDAB in the last 72 hours. Anemia Panel: No results for input(s): VITAMINB12,  FOLATE, FERRITIN, TIBC, IRON, RETICCTPCT in the last 72 hours. Urine analysis:    Component Value Date/Time   COLORURINE YELLOW 12/28/2016 0055   APPEARANCEUR HAZY (A) 12/28/2016 0055   LABSPEC 1.016 12/28/2016 0055   PHURINE 5.0 12/28/2016 0055   GLUCOSEU NEGATIVE 12/28/2016 0055   HGBUR NEGATIVE 12/28/2016 0055   BILIRUBINUR NEGATIVE 12/28/2016 0055   KETONESUR NEGATIVE 12/28/2016 0055   PROTEINUR NEGATIVE 12/28/2016 0055   UROBILINOGEN 0.2 02/05/2014 1900   NITRITE NEGATIVE 12/28/2016 0055   LEUKOCYTESUR NEGATIVE 12/28/2016 0055   Sepsis Labs: (procalcitonin:4,lacticidven:4) )No results found for this or any previous visit (from the past 240 hour(s)).       Radiology Studies: Dg Chest 2 View  Result Date: 12/27/2016 CLINICAL DATA:  Patient states generalized weakness and near syncopal episodes. Hx of diabetes, HTN, current smoker. EXAM: CHEST  2 VIEW COMPARISON:  Chest x-ray dated 10/10/2009. FINDINGS: Cardiomediastinal silhouette is stable. Lungs are clear. No pleural effusion or pneumothorax seen. No acute or suspicious osseous finding. IMPRESSION: No active cardiopulmonary disease. No evidence of pneumonia or pulmonary edema. Electronically Signed   By: Bary Richard M.D.   On: 12/27/2016 21:03   Ct Head Wo Contrast  Result Date: 12/27/2016 CLINICAL DATA:  Headache and neck pain; c/o almost syncopal episode; pt has been working outside all day but states he has been drinking water; ems reports pt was hypotensive on scene and given bolus EXAM: CT HEAD WITHOUT CONTRAST CT CERVICAL SPINE WITHOUT CONTRAST TECHNIQUE: Multidetector CT imaging of the head and cervical spine was performed following the standard protocol without intravenous contrast. Multiplanar CT image reconstructions of the cervical spine were also generated. COMPARISON:  Head CT dated 11/10/2004. FINDINGS: CT HEAD FINDINGS Brain: Ventricles are normal in size and configuration. All areas of the brain  demonstrate normal gray-white matter attenuation. There is no mass, hemorrhage, edema or other evidence of acute parenchymal abnormality. No extra-axial hemorrhage. Vascular: No hyperdense vessel or unexpected calcification. Skull: Normal. Negative for fracture or focal lesion. Sinuses/Orbits: Chronic appearing mucosal thickening within the frontal sinus and maxillary sinuses. Additional mucosal thickening throughout the ethmoid air cells, of uncertain chronicity. Periorbital and retro-orbital soft tissues are unremarkable. Other: None. CT CERVICAL SPINE FINDINGS Alignment: Slight reversal of the normal cervical spine lordosis. No evidence of acute vertebral body subluxation. Skull base and vertebrae: No fracture line or displaced fracture fragment. No acute appearing cortical irregularity or osseous lesion. Facet joints appear intact and normally aligned throughout. Soft tissues and spinal canal: No prevertebral fluid or swelling. No visible canal hematoma. Disc levels: Mild disc desiccations at the C4-5 through C6-7 levels, with associated mild disc bulges, causing mild to moderate  central canal stenoses. Upper chest: Negative. Other: None. IMPRESSION: 1. No acute intracranial abnormality. No intracranial mass, hemorrhage or edema. 2. Paranasal sinus disease, most of which appears chronic, ethmoid sinus disease is of uncertain age. 3. No acute findings within the cervical spine. 4. 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 5. Slight reversal of the normal cervical spine lordosis is likely related to the underlying degenerative changes but may be accentuated by patient positioning or muscle spasm. Electronically Signed   By: Bary Richard M.D.   On: 12/27/2016 21:11   Ct Cervical Spine Wo Contrast  Result Date: 12/27/2016 CLINICAL DATA:  Headache and neck pain; c/o almost syncopal episode; pt has been working outside  all day but states he has been drinking water; ems reports pt was hypotensive on scene and given bolus EXAM: CT HEAD WITHOUT CONTRAST CT CERVICAL SPINE WITHOUT CONTRAST TECHNIQUE: Multidetector CT imaging of the head and cervical spine was performed following the standard protocol without intravenous contrast. Multiplanar CT image reconstructions of the cervical spine were also generated. COMPARISON:  Head CT dated 11/10/2004. FINDINGS: CT HEAD FINDINGS Brain: Ventricles are normal in size and configuration. All areas of the brain demonstrate normal gray-white matter attenuation. There is no mass, hemorrhage, edema or other evidence of acute parenchymal abnormality. No extra-axial hemorrhage. Vascular: No hyperdense vessel or unexpected calcification. Skull: Normal. Negative for fracture or focal lesion. Sinuses/Orbits: Chronic appearing mucosal thickening within the frontal sinus and maxillary sinuses. Additional mucosal thickening throughout the ethmoid air cells, of uncertain chronicity. Periorbital and retro-orbital soft tissues are unremarkable. Other: None. CT CERVICAL SPINE FINDINGS Alignment: Slight reversal of the normal cervical spine lordosis. No evidence of acute vertebral body subluxation. Skull base and vertebrae: No fracture line or displaced fracture fragment. No acute appearing cortical irregularity or osseous lesion. Facet joints appear intact and normally aligned throughout. Soft tissues and spinal canal: No prevertebral fluid or swelling. No visible canal hematoma. Disc levels: Mild disc desiccations at the C4-5 through C6-7 levels, with associated mild disc bulges, causing mild to moderate central canal stenoses. Upper chest: Negative. Other: None. IMPRESSION: 1. No acute intracranial abnormality. No intracranial mass, hemorrhage or edema. 2. Paranasal sinus disease, most of which appears chronic, ethmoid sinus disease is of uncertain age. 3. No acute findings within the cervical spine. 4.  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 5. Slight reversal of the normal cervical spine lordosis is likely related to the underlying degenerative changes but may be accentuated by patient positioning or muscle spasm. Electronically Signed   By: Bary Richard M.D.   On: 12/27/2016 21:11      Scheduled Meds: . allopurinol  300 mg Oral q morning - 10a  . atorvastatin  20 mg Oral q1800  . colchicine  0.6 mg Oral q morning - 10a  . gabapentin  600 mg Oral BID  . heparin  5,000 Units Subcutaneous Q8H  . insulin aspart  0-9 Units Subcutaneous TID WC  . insulin detemir  49 Units Subcutaneous QHS  . loratadine  10 mg Oral Daily   Continuous Infusions: . sodium chloride 125 mL/hr at 12/28/16 1047     LOS: 0 days    Time spent in minutes: 35    Calvert Cantor, MD [facility] Pager: [email or web address] Password [facility] 12/28/2016, 4:35 PM

## 2016-12-29 LAB — BASIC METABOLIC PANEL
ANION GAP: 5 (ref 5–15)
BUN: 9 mg/dL (ref 6–20)
CHLORIDE: 107 mmol/L (ref 101–111)
CO2: 26 mmol/L (ref 22–32)
Calcium: 8.4 mg/dL — ABNORMAL LOW (ref 8.9–10.3)
Creatinine, Ser: 0.93 mg/dL (ref 0.61–1.24)
GFR calc non Af Amer: >60 mL/min (ref >60)
Glucose, Bld: 93 mg/dL (ref 65–99)
POTASSIUM: 4.1 mmol/L (ref 3.5–5.1)
SODIUM: 138 mmol/L (ref 135–145)

## 2016-12-29 LAB — GLUCOSE, CAPILLARY
GLUCOSE-CAPILLARY: 85 mg/dL (ref 65–99)
Glucose-Capillary: 108 mg/dL — ABNORMAL HIGH (ref 65–99)

## 2016-12-29 LAB — HIV ANTIBODY (ROUTINE TESTING W REFLEX): HIV Screen 4th Generation wRfx: NONREACTIVE

## 2016-12-29 LAB — CK: Total CK: 1432 U/L — ABNORMAL HIGH (ref 49–397)

## 2016-12-29 NOTE — Discharge Summary (Signed)
Physician Discharge Summary  Daniel Nicholson MVH:[id number] DOB: 12/11/1970 DOA: 12/27/2016  PCP: Gwenyth Bender, MD  Admit date: 12/27/2016 Discharge date: 12/29/2016  Time spent: 45 minutes  Recommendations for Outpatient Follow-up:  -To be discharged home today. -Advised to follow-up with outpatient provider in 2 weeks.   Discharge Diagnoses:  Active Problems:   Rhabdomyolysis   AKI (acute kidney injury) [facility])   Discharge Condition: Stable and improved  Filed Weights   12/27/16 1919 12/28/16 0157  Weight: 114.8 kg (253 lb) 115.2 kg (254 lb)    History of present illness:  As per Dr. Sharl Ma on 9/19: Daniel Nicholson  is a 46 y.o. male, With history of gout, diabetes mellitus, hypertension who came to hospital with complaints of generalized weakness and lightheadedness which started around 6 PM in the evening. 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 He denies passing out. Denies chest pain or shortness of breath. He has been drinking water throughout the day. Patient does have chronic neck pain. He denies any focal motor weakness but complains of tingling in the right second toe.  In the ED lab work showed CK 1937, creatinine 2.22.  Hospital Course:   Rhabdomyolysis/acute renal failure -Acute renal failure resolved with IV fluids.  -CPK down to 1200 on discharge, patient can continue to take fluids by mouth. -Since renal failure resolved, I am okay with resuming lisinopril/hydrochlorothiazide on discharge.  Hypertension and diabetes well controlled.   Procedures:  None   Consultations:  None  Discharge Instructions  Discharge Instructions    Diet - low sodium heart healthy    Complete by:  As directed    Increase activity slowly    Complete by:  As directed      Allergies as of 12/29/2016   No Known Allergies     Medication List    STOP taking these medications   amoxicillin 500 MG capsule Commonly known as:   AMOXIL     TAKE these medications   allopurinol 300 MG tablet Commonly known as:  ZYLOPRIM Take 300 mg by mouth every morning.   atorvastatin 20 MG tablet Commonly known as:  LIPITOR Take 20 mg by mouth daily.   BLISTEX LIP BALM 2-2.5-6.6 % Stck Apply to lips 4 times daily.   colchicine 0.6 MG tablet Take 0.6 mg by mouth every morning.   gabapentin 300 MG capsule Commonly known as:  NEURONTIN Take 600 mg by mouth 2 (two) times daily.   insulin detemir 100 UNIT/ML injection Commonly known as:  LEVEMIR Inject 49 Units into the skin at bedtime.   lisinopril-hydrochlorothiazide 10-12.5 MG tablet Commonly known as:  PRINZIDE,ZESTORETIC Take 1 tablet by mouth every morning.   loratadine 10 MG tablet Commonly known as:  CLARITIN Take 10 mg by mouth daily.   metFORMIN 500 MG tablet Commonly known as:  GLUCOPHAGE Take 2 tablets (1,000 mg total) by mouth 2 (two) times daily. Patient taking 2 tablets in the morning and 1 tablet in the evening.   TRADJENTA 5 MG Tabs tablet Generic drug:  linagliptin Take 5 mg by mouth daily.   TYLENOL 8 HOUR 650 MG CR tablet Generic drug:  acetaminophen Take 1,300 mg by mouth daily.            Discharge Care Instructions        Start     Ordered   12/29/16 0000  Increase activity slowly     12/29/16 1357  12/29/16 0000  Diet - low sodium heart healthy     12/29/16 1357     No Known Allergies Follow-up Information    Gwenyth Bender, MD. Schedule an appointment as soon as possible for a visit on 01/19/2017.   Specialty:  Internal Medicine Why:  10:45am Contact information: [address] [place] [address] [phone]            The results of significant diagnostics from this hospitalization (including imaging, microbiology, ancillary and laboratory) are listed below for reference.    Significant Diagnostic Studies: Dg Chest 2 View  Result Date: 12/27/2016 CLINICAL DATA:  Patient states generalized  weakness and near syncopal episodes. Hx of diabetes, HTN, current smoker. EXAM: CHEST  2 VIEW COMPARISON:  Chest x-ray dated 10/10/2009. FINDINGS: Cardiomediastinal silhouette is stable. Lungs are clear. No pleural effusion or pneumothorax seen. No acute or suspicious osseous finding. IMPRESSION: No active cardiopulmonary disease. No evidence of pneumonia or pulmonary edema. Electronically Signed   By: Bary Richard M.D.   On: 12/27/2016 21:03   Ct Head Wo Contrast  Result Date: 12/27/2016 CLINICAL DATA:  Headache and neck pain; c/o almost syncopal episode; pt has been working outside all day but states he has been drinking water; ems reports pt was hypotensive on scene and given bolus EXAM: CT HEAD WITHOUT CONTRAST CT CERVICAL SPINE WITHOUT CONTRAST TECHNIQUE: Multidetector CT imaging of the head and cervical spine was performed following the standard protocol without intravenous contrast. Multiplanar CT image reconstructions of the cervical spine were also generated. COMPARISON:  Head CT dated 11/10/2004. FINDINGS: CT HEAD FINDINGS Brain: Ventricles are normal in size and configuration. All areas of the brain demonstrate normal gray-white matter attenuation. There is no mass, hemorrhage, edema or other evidence of acute parenchymal abnormality. No extra-axial hemorrhage. Vascular: No hyperdense vessel or unexpected calcification. Skull: Normal. Negative for fracture or focal lesion. Sinuses/Orbits: Chronic appearing mucosal thickening within the frontal sinus and maxillary sinuses. Additional mucosal thickening throughout the ethmoid air cells, of uncertain chronicity. Periorbital and retro-orbital soft tissues are unremarkable. Other: None. CT CERVICAL SPINE FINDINGS Alignment: Slight reversal of the normal cervical spine lordosis. No evidence of acute vertebral body subluxation. Skull base and vertebrae: No fracture line or displaced fracture fragment. No acute appearing cortical irregularity or  osseous lesion. Facet joints appear intact and normally aligned throughout. Soft tissues and spinal canal: No prevertebral fluid or swelling. No visible canal hematoma. Disc levels: Mild disc desiccations at the C4-5 through C6-7 levels, with associated mild disc bulges, causing mild to moderate central canal stenoses. Upper chest: Negative. Other: None. IMPRESSION: 1. No acute intracranial abnormality. No intracranial mass, hemorrhage or edema. 2. Paranasal sinus disease, most of which appears chronic, ethmoid sinus disease is of uncertain age. 3. No acute findings within the cervical spine. 4. 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 5. Slight reversal of the normal cervical spine lordosis is likely related to the underlying degenerative changes but may be accentuated by patient positioning or muscle spasm. Electronically Signed   By: Bary Richard M.D.   On: 12/27/2016 21:11   Ct Cervical Spine Wo Contrast  Result Date: 12/27/2016 CLINICAL DATA:  Headache and neck pain; c/o almost syncopal episode; pt has been working outside all day but states he has been drinking water; ems reports pt was hypotensive on scene and given bolus EXAM: CT HEAD WITHOUT CONTRAST CT  CERVICAL SPINE WITHOUT CONTRAST TECHNIQUE: Multidetector CT imaging of the head and cervical spine was performed following the standard protocol without intravenous contrast. Multiplanar CT image reconstructions of the cervical spine were also generated. COMPARISON:  Head CT dated 11/10/2004. FINDINGS: CT HEAD FINDINGS Brain: Ventricles are normal in size and configuration. All areas of the brain demonstrate normal gray-white matter attenuation. There is no mass, hemorrhage, edema or other evidence of acute parenchymal abnormality. No extra-axial hemorrhage. Vascular: No hyperdense vessel or unexpected calcification. Skull: Normal. Negative for  fracture or focal lesion. Sinuses/Orbits: Chronic appearing mucosal thickening within the frontal sinus and maxillary sinuses. Additional mucosal thickening throughout the ethmoid air cells, of uncertain chronicity. Periorbital and retro-orbital soft tissues are unremarkable. Other: None. CT CERVICAL SPINE FINDINGS Alignment: Slight reversal of the normal cervical spine lordosis. No evidence of acute vertebral body subluxation. Skull base and vertebrae: No fracture line or displaced fracture fragment. No acute appearing cortical irregularity or osseous lesion. Facet joints appear intact and normally aligned throughout. Soft tissues and spinal canal: No prevertebral fluid or swelling. No visible canal hematoma. Disc levels: Mild disc desiccations at the C4-5 through C6-7 levels, with associated mild disc bulges, causing mild to moderate central canal stenoses. Upper chest: Negative. Other: None. IMPRESSION: 1. No acute intracranial abnormality. No intracranial mass, hemorrhage or edema. 2. Paranasal sinus disease, most of which appears chronic, ethmoid sinus disease is of uncertain age. 3. No acute findings within the cervical spine. 4. 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 5. Slight reversal of the normal cervical spine lordosis is likely related to the underlying degenerative changes but may be accentuated by patient positioning or muscle spasm. Electronically Signed   By: Bary Richard M.D.   On: 12/27/2016 21:11    Microbiology: No results found for this or any previous visit (from the past 240 hour(s)).   Labs: Basic Metabolic Panel:  Recent Labs Lab 12/27/16 2052 12/28/16 0656 12/29/16 0637  NA 137 140 138  K 3.8 3.5 4.1  CL 101 105 107  CO2 GLUCOSE 97 79 93  BUN CREATININE 2.22* 1.27* 0.93  CALCIUM 9.1 8.8* 8.4*   Liver Function Tests:  Recent Labs Lab 12/27/16 2052  12/28/16 0656  AST 58* 61*  ALT 41 38  ALKPHOS 34* 33*  BILITOT 0.9 0.8  PROT 7.2 6.2*  ALBUMIN 4.7 3.9    Recent Labs Lab 12/27/16 2052  LIPASE 47   No results for input(s): AMMONIA in the last 168 hours. CBC:  Recent Labs Lab 12/27/16 2052 12/28/16 0656  WBC 8.3 8.3  NEUTROABS 5.8  --   HGB 15.3 14.6  HCT 45.6 43.0  MCV 89.2 89.0  PLT 170 172   Cardiac Enzymes:  Recent Labs Lab 12/27/16 2052 12/28/16 0656 12/29/16 0637  CKTOTAL 1,937* 2,382* 1,432*  TROPONINI <0.03  --   --    BNP: BNP (last 3 results) No results for input(s): BNP in the last 8760 hours.  ProBNP (last 3 results) No results for input(s): PROBNP in the last 8760 hours.  CBG:  Recent Labs Lab 12/28/16 1116 12/28/16 1628 12/28/16 2056 12/29/16 0731 12/29/16 1137  GLUCAP 150* 103* 143* 108* 85       Signed:  HERNANDEZ ACOSTA,Zenon Leaf  [facility] Pager: [phone] 12/29/2016, 4:19 PM

## 2016-12-29 NOTE — Care Management Note (Signed)
Case Management Note  Patient Details  Name: Daniel Nicholson MRN: [id number] Date of Birth: 02-Jan-1971  Subjective/Objective:                  Admitted with Rhabdo. Chart reviewed for CM needs. Pt from home, lives with family and is ind with ADL's. He has PCP, transportation and insurance with drug coverage. Pt works and is not homebound.   Action/Plan: DC home with self care. No CM needs noted at this time.   Expected Discharge Date:  12/29/16               Expected Discharge Plan:  Home/Self Care  In-House Referral:  NA  Discharge planning Services  CM Consult  Post Acute Care Choice:  NA Choice offered to:  NA  Status of Service:  Completed, signed off  Malcolm Metro, RN 12/29/2016, 1:58 PM

## 2017-01-02 ENCOUNTER — Other Ambulatory Visit: Payer: Self-pay

## 2017-01-02 DIAGNOSIS — K219 Gastro-esophageal reflux disease without esophagitis: Secondary | ICD-10-CM

## 2017-01-02 DIAGNOSIS — K760 Fatty (change of) liver, not elsewhere classified: Secondary | ICD-10-CM

## 2017-01-03 LAB — HEPATIC FUNCTION PANEL
AG Ratio: 2.2 (calc) (ref 1.0–2.5)
ALBUMIN MSPROF: 4.6 g/dL (ref 3.6–5.1)
ALT: 49 U/L — ABNORMAL HIGH (ref 9–46)
AST: 24 U/L (ref 10–40)
Alkaline phosphatase (APISO): 38 U/L — ABNORMAL LOW (ref 40–115)
BILIRUBIN INDIRECT: 0.3 mg/dL (ref 0.2–1.2)
BILIRUBIN TOTAL: 0.4 mg/dL (ref 0.2–1.2)
Bilirubin, Direct: 0.1 mg/dL (ref 0.0–0.2)
GLOBULIN: 2.1 g/dL (ref 1.9–3.7)
Total Protein: 6.7 g/dL (ref 6.1–8.1)

## 2017-01-09 ENCOUNTER — Other Ambulatory Visit: Payer: Self-pay

## 2017-01-09 DIAGNOSIS — R7989 Other specified abnormal findings of blood chemistry: Secondary | ICD-10-CM

## 2017-01-09 DIAGNOSIS — R945 Abnormal results of liver function studies: Principal | ICD-10-CM

## 2017-01-09 NOTE — Progress Notes (Signed)
LFTs with very marginal elevation of ALT. Check every 6 months. Overall stable.

## 2017-01-09 NOTE — Progress Notes (Signed)
Pt is aware and lab order on file for 6 months.

## 2017-01-23 LAB — HEPATIC FUNCTION PANEL
AG Ratio: 2.1 (calc) (ref 1.0–2.5)
ALKALINE PHOSPHATASE (APISO): 34 U/L — AB (ref 40–115)
ALT: 30 U/L (ref 9–46)
AST: 28 U/L (ref 10–40)
Albumin: 4.5 g/dL (ref 3.6–5.1)
BILIRUBIN INDIRECT: 0.6 mg/dL (ref 0.2–1.2)
Bilirubin, Direct: 0.1 mg/dL (ref 0.0–0.2)
Globulin: 2.1 g/dL (calc) (ref 1.9–3.7)
TOTAL PROTEIN: 6.6 g/dL (ref 6.1–8.1)
Total Bilirubin: 0.7 mg/dL (ref 0.2–1.2)

## 2017-05-14 ENCOUNTER — Encounter: Payer: Self-pay | Admitting: Nurse Practitioner

## 2017-05-14 ENCOUNTER — Ambulatory Visit: Payer: Medicaid Other | Admitting: Nurse Practitioner

## 2017-05-14 VITALS — BP 126/84 | HR 100 | Temp 98.0°F | Ht 76.0 in | Wt 276.8 lb

## 2017-05-14 DIAGNOSIS — K649 Unspecified hemorrhoids: Secondary | ICD-10-CM

## 2017-05-14 DIAGNOSIS — K625 Hemorrhage of anus and rectum: Secondary | ICD-10-CM | POA: Diagnosis not present

## 2017-05-14 DIAGNOSIS — K5900 Constipation, unspecified: Secondary | ICD-10-CM

## 2017-05-14 NOTE — Assessment & Plan Note (Signed)
Is a history of internal hemorrhoids causing hematochezia previously.  He was previously completed a colonoscopy in 2014 which was normal except for his internal hemorrhoids.  He subsequently underwent an office hemorrhoid banding with resolution of symptoms for many years.  He is now having scant toilet tissue hematochezia with bowel movements.  It seems like he might be a bit constipated, as per below.  He does have hemorrhoid symptoms including rectal irritation, itching, burning.  I recommended continue hemorrhoid cream, use medicated wipes.  I will discuss with Dr. Jena Gaussourk about repeat banding versus need for repeat colonoscopy prior to any additional banding.  We will call him with results.  Return for follow-up based on recommendations or in 2 months.

## 2017-05-14 NOTE — Assessment & Plan Note (Signed)
The patient seems to have some intermittent constipation although he denies this.  His stools are sometimes hard and some straining.  At this point, given his history of hemorrhoids and hematochezia due to the status post hemorrhoid banding in 2015, I will start him on MiraLAX 1-2 times a day as needed.  I discussed that he can back off to once every other day if it is too much.  Further follow-up as per above.

## 2017-05-14 NOTE — Progress Notes (Signed)
Referring Provider: Gwenyth Benderean, Timber Marshman L, MD Primary Care Physician:  Gwenyth Benderean, Lashauna Arpin L, MD Primary GI:  Dr. Jena Gaussourk  Chief Complaint  Patient presents with  . Rectal Bleeding    w/ BM x 1 month    HPI:   Daniel Nicholson is a 47 y.o. male who presents for toilet tissue hematochezia.  The patient was last seen in our office 08/23/2016 for fatty liver and GERD.  That time it was noted that serial LFTs every 6 months has been normal since 2015, has made dietary modifications and weight is down approximately 13 pounds.  He has increased exercise.  At his last visit he was doing well overall, continued to do well with weight loss with GERD well controlled without breakthrough.  Objectively lost 28 pounds in the previous year with concerted effort and focus on diet and exercise/healthy lifestyle.  No other GI symptoms.  Recommended continue medications, recheck LFTs now and in 6 months, follow-up in 1 year.  HFP completed 08/29/2016 showed persistently normal LFTs.  Repeat on 01/23/2017 showed again persistently normal LFTs.  The patient was admitted 12/27/2016 through 12/29/2016 for rhabdomyolysis and acute kidney injury.  He was having complaints of weakness and lightheadedness and had been working persistently outside in the hot weather.  Denied syncope.  His rhabdomyolysis and acute renal failure resolved with IV fluids.  Recommended follow-up with primary care 2 weeks after discharge.  His last colonoscopy was completed 12/16/2012 for hematochezia.  This was done on conscious sedation.  Findings include internal hemorrhoids as likely source of hematochezia.  Normal colonoscopy otherwise.  Offered hemorrhoid banding in the office and repeat colonoscopy in 10 years (2024).  It appears she underwent approximately 4 banding procedures through January 2015.  In July 2015 he noted more blood when he wipes when he was having some diarrhea but this self resolved.  Apparently no complaints since then.  Today he  states he's doing ok overall. Started noting toilet tissue hematochezia 2-4 weeks ago, occurs with every bowel movement. Has feeling of rectal irritation. Thinks it's related to eating spicy foods. Denies constipation, some stools are hard and some straining but most times consistent with [place] 4. Denies diarrhea. Also has rectal burning and itching. Bleeding limited to toilet tissue hematochezia, none in the toilet/stool. Denies abdominal pain, N/V, melena, fever, chills, unintentional weight loss. Previously went 2-3 times a day with a bowel movement and this down to once daily; no changes in stool characteristics. Energy is good. Denies chest pain, dyspnea, dizziness, lightheadedness, syncope, near syncope. Denies any other upper or lower GI symptoms.  Past Medical History:  Diagnosis Date  . Acid reflux   . Diabetes mellitus without complication (HCC)   . Gout   . Hemorrhoids    s/p right posterior, left lateral, right anterior banding. Last banding procedure Jan 2015.   [name]. Hypertension   . Neck pain   . Poor circulation   . S/P tooth extraction 08/16/2016   three teeth extracted  . Tremors of nervous system     Past Surgical History:  Procedure Laterality Date  . COLONOSCOPY N/A 12/16/2012   Dr. Jena Gaussourk- internal hemorrhoids otherwise normal. Screening in 2024.   [name]. HEMORRHOID BANDING    . None      Current Outpatient Medications  Medication Sig Dispense Refill  . acetaminophen (TYLENOL 8 HOUR) 650 MG CR tablet Take 1,300 mg by mouth daily.    [name]. allopurinol (ZYLOPRIM) 300 MG tablet Take 300 mg by  mouth every morning.     [name] atorvastatin (LIPITOR) 20 MG tablet Take 20 mg by mouth daily.    . colchicine 0.6 MG tablet Take 0.6 mg by mouth every morning.     . gabapentin (NEURONTIN) 300 MG capsule Take 600 mg by mouth 2 (two) times daily.    . hydrocortisone (ANUSOL-HC) 2.5 % rectal cream Place 1 application rectally as needed for hemorrhoids or anal itching.    . insulin detemir  (LEVEMIR) 100 UNIT/ML injection Inject 49 Units into the skin at bedtime.     [name] lisinopril-hydrochlorothiazide (PRINZIDE,ZESTORETIC) 10-12.5 MG per tablet Take 1 tablet by mouth every morning.     . loratadine (CLARITIN) 10 MG tablet Take 10 mg by mouth daily.    . metFORMIN (GLUCOPHAGE) 500 MG tablet Take 2 tablets (1,000 mg total) by mouth 2 (two) times daily. Patient taking 2 tablets in the morning and 1 tablet in the evening. (Patient taking differently: Take 250 mg by mouth 2 (two) times daily. ) 60 tablet 0   No current facility-administered medications for this visit.     Allergies as of 05/14/2017  . (No Known Allergies)    Family History  Problem Relation Age of Onset  . Hypertension Mother   . Arthritis Unknown   . Colon cancer Neg Hx     Social History   Socioeconomic History  . Marital status: Divorced    Spouse name: None  . Number of children: None  . Years of education: 12th grade  . Highest education level: None  Social Needs  . Financial resource strain: None  . Food insecurity - worry: None  . Food insecurity - inability: None  . Transportation needs - medical: None  . Transportation needs - non-medical: None  Occupational History  . Occupation: unemployed  Tobacco Use  . Smoking status: Current Every Day Smoker    Packs/day: 0.50    Years: 30.00    Pack years: 15.00    Types: Cigarettes  . Smokeless tobacco: Never Used  . Tobacco comment: Smokes 6-7 cigarettes daily  Substance and Sexual Activity  . Alcohol use: Yes    Alcohol/week: 0.0 oz    Comment: occ. beer but rare   . Drug use: No  . Sexual activity: Yes    Birth control/protection: Condom  Other Topics Concern  . None  Social History Narrative  . None    Review of Systems: General: Negative for anorexia, weight loss, fever, chills, fatigue, weakness. ENT: Negative for hoarseness, difficulty swallowing , nasal congestion. CV: Negative for chest pain, angina, palpitations, dyspnea on  exertion, peripheral edema.  Respiratory: Negative for dyspnea at rest, dyspnea on exertion, cough, sputum, wheezing.  GI: See history of present illness. Endo: Negative for unusual weight change.  Heme: Negative for bruising or bleeding.    Physical Exam: BP 126/84   Pulse 100   Temp 98 F (36.7 C) (Oral)   Ht 6\' 4"  (1.93 m)   Wt 276 lb 12.8 oz (125.6 kg)   BMI 33.69 kg/m  General:   Alert and oriented. Pleasant and cooperative. Well-nourished and well-developed.  Eyes:  Without icterus, sclera clear and conjunctiva pink.  Ears:  Normal auditory acuity. Cardiovascular:  S1, S2 present without murmurs appreciated. Extremities without clubbing or edema. Respiratory:  Clear to auscultation bilaterally. No wheezes, rales, or rhonchi. No distress.  Gastrointestinal:  +BS, soft, non-tender and non-distended. No HSM noted. No guarding or rebound. No masses appreciated.  Rectal:  Deferred  Musculoskalatal:  Symmetrical without gross deformities. Neurologic:  Alert and oriented x4;  grossly normal neurologically. Psych:  Alert and cooperative. Normal mood and affect. Heme/Lymph/Immune: No excessive bruising noted.    05/14/2017 10:17 AM   Disclaimer: This note was dictated with voice recognition software. Similar sounding words can inadvertently be transcribed and may not be corrected upon review.

## 2017-05-14 NOTE — Assessment & Plan Note (Signed)
Cannot toilet tissue hematochezia in the setting of history of internal hemorrhoids on colonoscopy in 2014 after undergoing multiple banding procedures in office.  His symptoms resolved for quite some time.  He is having recurrent symptoms now and they seem very textbook for hemorrhoid recurrence.  It has been 5 years since his last colonoscopy which was essentially normal.  At this point we will discuss need for repeat colonoscopy versus repeat banding.  I will aim to keep his constipation under control.  Follow-up in 2 months or based on recommendations.

## 2017-05-14 NOTE — Patient Instructions (Signed)
1. Use MiraLAX 1-2 times a day when you have any symptoms of constipation. 2. If this is too much, you can back off to once every other day or half dose every day. 3. Continue to use the rectal cream that you have that seems to be working.  You can use this up to twice a day for up to 10 days at a time. 4. You can try using over-the-counter Preparation H medicated wipes for your hemorrhoid symptoms as well. 5. I will discuss her situation with Dr. Jena Gaussourk. 6. We will call you with further recommendations. 7. Return for follow-up in 2 months, or based on recommendations after we call you. 8. Call if you have any questions or concerns.

## 2017-05-15 ENCOUNTER — Telehealth: Payer: Self-pay | Admitting: Nurse Practitioner

## 2017-05-15 ENCOUNTER — Other Ambulatory Visit: Payer: Self-pay

## 2017-05-15 DIAGNOSIS — K625 Hemorrhage of anus and rectum: Secondary | ICD-10-CM

## 2017-05-15 MED ORDER — CLENPIQ 10-3.5-12 MG-GM -GM/160ML PO SOLN: 1.0000 | Freq: Once | ORAL | 0 refills | Status: AC

## 2017-05-15 NOTE — Progress Notes (Signed)
I spoke with the patient over the phone.  Plan for colonoscopy on conscious sedation with Dr. Jena Gaussourk.  I will sent to staff to proceed with scheduling.  Proceed with TCS with Dr. Jena Gaussourk in near future: the risks, benefits, and alternatives have been discussed with the patient in detail. The patient states understanding and desires to proceed.  The patient is not on any anticoagulants, anxiolytics, chronic pain medications, or antidepressants.  Conscious sedation should be adequate for his procedure as it was for his last.

## 2017-05-15 NOTE — Progress Notes (Signed)
CC'D TO PCP °

## 2017-05-15 NOTE — Telephone Encounter (Signed)
I spoke with Dr. Jena Gaussourk about the patient. At this point let's go ahead and set the patient up for repeat colonoscopy on conscious sedation.  DM Meds: Half meds night before, none morning of.  I spoke with the patient and he is aware of the plan, expecting a call for scheduling.

## 2017-05-15 NOTE — Telephone Encounter (Signed)
Called pt TCS with RMR scheduled for 07/11/17 at 8:15am. Rx for prep sent to pharmacy. Instructions mailed to pt. Orders entered.

## 2017-06-04 ENCOUNTER — Telehealth: Payer: Self-pay | Admitting: Internal Medicine

## 2017-06-04 NOTE — Telephone Encounter (Signed)
[phone] PLEASE CALL PATIENT ABOUT A CREAM THAT HE WANTS US TO CALL IN FOR HIM

## 2017-06-04 NOTE — Telephone Encounter (Signed)
Routing message 

## 2017-06-04 NOTE — Telephone Encounter (Signed)
Spoke with pt and he wants clotrimazole cream which is mixed with a numbing med called in for him. Pt said it's been called in for him before due to the pain, burning sensations for the hemorrhoids. Pt was seen 05/14/17.

## 2017-06-05 ENCOUNTER — Telehealth: Payer: Self-pay

## 2017-06-05 MED ORDER — HYDROCORTISONE 2.5 % RE CREA: 1.0000 "application " | TOPICAL_CREAM | Freq: Two times a day (BID) | RECTAL | 1 refills | Status: DC

## 2017-06-05 NOTE — Telephone Encounter (Signed)
Pt walked in office at 2:40pm. Pt called yesterday and wanted a refill of his medication. Message was sent to refill box. Pt had the correct name on med with him. Clotrimazole/betamethasone. Pt states he really needs a refill of his medication. Pt had refills previously but they have expired.

## 2017-06-05 NOTE — Addendum Note (Signed)
Addended by: Gelene MinkBOONE, Kamora Vossler W on: 06/05/2017 03:14 PM   Modules accepted: Orders

## 2017-06-05 NOTE — Telephone Encounter (Signed)
Noted. Pt will try anusol cr. Will discuss further with EG.

## 2017-06-05 NOTE — Telephone Encounter (Signed)
As I haven't seen this patient recently, please address with Minerva AreolaEric tomorrow regarding the specific medication he is referring to.   I note he has anusol on his list? I can send this in. He can also use the OTC lidocaine and apply per rectum prn. Further recommendations per Minerva AreolaEric.

## 2017-06-06 ENCOUNTER — Telehealth: Payer: Self-pay

## 2017-06-06 NOTE — Telephone Encounter (Signed)
Pt called to see if his medication was sent to his pharmacy. Medication has been sent to pts pharmacy.

## 2017-06-07 NOTE — Telephone Encounter (Signed)
No further recommendations at this time. We can try Apothecary cream if Anusol not effective.

## 2017-06-18 ENCOUNTER — Other Ambulatory Visit: Payer: Self-pay | Admitting: Gastroenterology

## 2017-06-25 ENCOUNTER — Other Ambulatory Visit: Payer: Self-pay

## 2017-06-25 DIAGNOSIS — R945 Abnormal results of liver function studies: Principal | ICD-10-CM

## 2017-06-25 DIAGNOSIS — R7989 Other specified abnormal findings of blood chemistry: Secondary | ICD-10-CM

## 2017-07-05 ENCOUNTER — Telehealth: Payer: Self-pay

## 2017-07-05 LAB — HEPATIC FUNCTION PANEL
AG Ratio: 2.8 (calc) — ABNORMAL HIGH (ref 1.0–2.5)
ALKALINE PHOSPHATASE (APISO): 36 U/L — AB (ref 40–115)
ALT: 24 U/L (ref 9–46)
AST: 29 U/L (ref 10–40)
Albumin: 5.1 g/dL (ref 3.6–5.1)
BILIRUBIN INDIRECT: 0.6 mg/dL (ref 0.2–1.2)
BILIRUBIN TOTAL: 0.8 mg/dL (ref 0.2–1.2)
Bilirubin, Direct: 0.2 mg/dL (ref 0.0–0.2)
GLOBULIN: 1.8 g/dL — AB (ref 1.9–3.7)
Total Protein: 6.9 g/dL (ref 6.1–8.1)

## 2017-07-05 MED ORDER — HYDROCORTISONE 2.5 % RE CREA: TOPICAL_CREAM | RECTAL | 0 refills | Status: DC

## 2017-07-05 NOTE — Telephone Encounter (Signed)
Done

## 2017-07-05 NOTE — Telephone Encounter (Signed)
Pt would like a refill of his proct/HC 2.5% rectal cr called into his pharmacy.

## 2017-07-05 NOTE — Addendum Note (Signed)
Addended by: Gelene MinkBOONE, Hamdi Vari W on: 07/05/2017 10:05 AM   Modules accepted: Orders

## 2017-07-09 ENCOUNTER — Other Ambulatory Visit: Payer: Self-pay

## 2017-07-09 DIAGNOSIS — K760 Fatty (change of) liver, not elsewhere classified: Secondary | ICD-10-CM

## 2017-07-09 NOTE — Progress Notes (Signed)
LFTs normal. Check every 6 months.

## 2017-07-11 ENCOUNTER — Ambulatory Visit (HOSPITAL_COMMUNITY)
Admission: RE | Admit: 2017-07-11 | Discharge: 2017-07-11 | Disposition: A | Discharge status: Home / Self Care | Payer: Medicaid Other | Source: Ambulatory Visit | Attending: Internal Medicine | Admitting: Internal Medicine

## 2017-07-11 ENCOUNTER — Encounter (HOSPITAL_COMMUNITY)
Admission: RE | Disposition: A | Discharge status: Home / Self Care | Payer: Self-pay | Source: Ambulatory Visit | Attending: Internal Medicine

## 2017-07-11 ENCOUNTER — Other Ambulatory Visit: Payer: Self-pay

## 2017-07-11 ENCOUNTER — Encounter (HOSPITAL_COMMUNITY): Payer: Self-pay | Admitting: *Deleted

## 2017-07-11 DIAGNOSIS — M109 Gout, unspecified: Secondary | ICD-10-CM | POA: Diagnosis not present

## 2017-07-11 DIAGNOSIS — F17210 Nicotine dependence, cigarettes, uncomplicated: Secondary | ICD-10-CM | POA: Insufficient documentation

## 2017-07-11 DIAGNOSIS — I10 Essential (primary) hypertension: Secondary | ICD-10-CM | POA: Diagnosis not present

## 2017-07-11 DIAGNOSIS — K219 Gastro-esophageal reflux disease without esophagitis: Secondary | ICD-10-CM | POA: Diagnosis not present

## 2017-07-11 DIAGNOSIS — K921 Melena: Secondary | ICD-10-CM | POA: Diagnosis not present

## 2017-07-11 DIAGNOSIS — Z79899 Other long term (current) drug therapy: Secondary | ICD-10-CM | POA: Insufficient documentation

## 2017-07-11 DIAGNOSIS — E119 Type 2 diabetes mellitus without complications: Secondary | ICD-10-CM | POA: Diagnosis not present

## 2017-07-11 DIAGNOSIS — K640 First degree hemorrhoids: Secondary | ICD-10-CM

## 2017-07-11 DIAGNOSIS — K625 Hemorrhage of anus and rectum: Secondary | ICD-10-CM

## 2017-07-11 DIAGNOSIS — Z794 Long term (current) use of insulin: Secondary | ICD-10-CM | POA: Diagnosis not present

## 2017-07-11 HISTORY — PX: COLONOSCOPY: SHX5424

## 2017-07-11 LAB — GLUCOSE, CAPILLARY: GLUCOSE-CAPILLARY: 104 mg/dL — AB (ref 65–99)

## 2017-07-11 SURGERY — COLONOSCOPY
Anesthesia: Moderate Sedation

## 2017-07-11 MED ORDER — MIDAZOLAM HCL 5 MG/5ML IJ SOLN
INTRAMUSCULAR | Status: DC | PRN
  Administered 2017-07-11 (×2): 1 mg via INTRAVENOUS
  Administered 2017-07-11: 2 mg via INTRAVENOUS

## 2017-07-11 MED ORDER — MEPERIDINE HCL 100 MG/ML IJ SOLN
INTRAMUSCULAR | Status: DC | PRN
  Administered 2017-07-11: 25 mg via INTRAVENOUS
  Administered 2017-07-11: 50 mg via INTRAVENOUS
  Administered 2017-07-11: 25 mg via INTRAVENOUS

## 2017-07-11 MED ORDER — SODIUM CHLORIDE 0.9 % IV SOLN
INTRAVENOUS | Status: DC
  Administered 2017-07-11: 07:00:00 via INTRAVENOUS

## 2017-07-11 MED ORDER — MEPERIDINE HCL 100 MG/ML IJ SOLN
INTRAMUSCULAR | Status: AC
  Filled 2017-07-11: qty 2

## 2017-07-11 MED ORDER — MIDAZOLAM HCL 5 MG/5ML IJ SOLN
INTRAMUSCULAR | Status: AC
  Filled 2017-07-11: qty 10

## 2017-07-11 MED ORDER — STERILE WATER FOR IRRIGATION IR SOLN
Status: DC | PRN
  Administered 2017-07-11: 2.5 mL

## 2017-07-11 MED ORDER — ONDANSETRON HCL 4 MG/2ML IJ SOLN
INTRAMUSCULAR | Status: AC
  Filled 2017-07-11: qty 2

## 2017-07-11 MED ORDER — ONDANSETRON HCL 4 MG/2ML IJ SOLN
INTRAMUSCULAR | Status: DC | PRN
  Administered 2017-07-11: 4 mg via INTRAVENOUS

## 2017-07-11 NOTE — Op Note (Signed)
[facility] Patient Name: Daniel Nicholson Procedure Date: 07/11/2017 8:05 AM MRN: [id number] Date of Birth: 1970-06-29 Attending MD: Gennette Pac , MD CSN: [id number] Age: 47 Admit Type: Outpatient Procedure:                Colonoscopy Indications:              Hematochezia Providers:                Gennette Pac, MD, Nena Polio, RN, Dyann Ruddle Referring MD:              Medicines:                Midazolam 4 mg IV, Meperidine 100 mg IV,                            Ondansetron 4 mg IV Complications:            No immediate complications. Estimated Blood Loss:     Estimated blood loss: none. Procedure:                Pre-Anesthesia Assessment:                           - Prior to the procedure, a History and Physical                            was performed, and patient medications and                            allergies were reviewed. The patient's tolerance of                            previous anesthesia was also reviewed. The risks                            and benefits of the procedure and the sedation                            options and risks were discussed with the patient.                            All questions were answered, and informed consent                            was obtained. Prior Anticoagulants: The patient has                            taken no previous anticoagulant or antiplatelet                            agents. ASA Grade Assessment: II - A patient with  mild systemic disease. After reviewing the risks                            and benefits, the patient was deemed in                            satisfactory condition to undergo the procedure.                           After obtaining informed consent, the colonoscope                            was passed under direct vision. Throughout the                            procedure, the patient's blood pressure, pulse, and                             oxygen saturations were monitored continuously. The                            EC-[id number] ([id number]) scope was introduced through                            the anus and advanced to the the cecum, identified                            by appendiceal orifice and ileocecal valve. The                            colonoscopy was performed without difficulty. The                            patient tolerated the procedure well. The quality                            of the bowel preparation was adequate. The entire                            colon was well visualized. Scope In: 8:28:44 AM Scope Out: 8:42:42 AM Scope Withdrawal Time: 0 hours 8 minutes 42 seconds  Total Procedure Duration: 0 hours 13 minutes 58 seconds  Findings:      The perianal and digital rectal examinations were normal.      Non-bleeding internal hemorrhoids were found during retroflexion. The       hemorrhoids were mild, small and Grade I (internal hemorrhoids that do       not prolapse).      The exam was otherwise without abnormality on direct and retroflexion       views. Impression:               - Non-bleeding internal hemorrhoids.                           - The examination was otherwise normal on direct  and retroflexion views.                           - No specimens collected. Moderate Sedation:      Moderate (conscious) sedation was administered by the endoscopy nurse       and supervised by the endoscopist. The following parameters were       monitored: oxygen saturation, heart rate, blood pressure, respiratory       rate, EKG, adequacy of pulmonary ventilation, and response to care.       Total physician intraservice time was 20 minutes. Recommendation:           - Patient has a contact number available for                            emergencies. The signs and symptoms of potential                            delayed complications were discussed with the                             patient. Return to normal activities tomorrow.                            Written discharge instructions were provided to the                            patient.                           - Resume previous diet. Stop. Topical                            hydrocortisone; 1 month course of Canasa                            suppositories 1 graqm per rectum at bedtime;                            Benefiber 1 tablespoon twice daily. Office visit                            with [place] in 4-6 weeks Procedure Code(s):        --- Professional ---                           [phone], Colonoscopy, flexible; diagnostic, including                            collection of specimen(s) by brushing or washing,                            when performed (separate procedure)                           G0500, Moderate sedation services provided by the  same physician or other qualified health care                            professional performing a gastrointestinal                            endoscopic service that sedation supports,                            requiring the presence of an independent trained                            observer to assist in the monitoring of the                            patient's level of consciousness and physiological                            status; initial 15 minutes of intra-service time;                            patient age 84 years or older (additional time may                            be reported with 96045, as appropriate) Diagnosis Code(s):        --- Professional ---                           K64.0, First degree hemorrhoids                           K92.1, Melena (includes Hematochezia) CPT copyright 2017 American Medical Association. All rights reserved. The codes documented in this report are preliminary and upon coder review may  be revised to meet current compliance requirements. Gerrit Friends. Carletta Feasel, MD Gennette Pac,  MD 07/11/2017 8:51:41 AM This report has been signed electronically. Number of Addenda: 0

## 2017-07-11 NOTE — H&P (Signed)
 @LOGO @   Primary Care Physician:  Gwenyth Benderean, Eric L, MD Primary Gastroenterologist:  Dr. Jena Gaussourk  Pre-Procedure History & Physical: HPI:  Daniel Nicholson is a 47 y.o. male here for diagnostic colonoscopy for rectal bleeding. Had hemorrhoid banding back in 2015. It was associated with resolution of bleeding until recently. He has been 5 years since he had a colonoscopy. He is here for colonoscopy due to recurrent bleeding.  Past Medical History:  Diagnosis Date  . Acid reflux   . Diabetes mellitus without complication (HCC)   . Gout   . Hemorrhoids    s/p right posterior, left lateral, right anterior banding. Last banding procedure Jan 2015.   [name]. Hypertension   . Neck pain   . Poor circulation   . S/P tooth extraction 08/16/2016   three teeth extracted  . Tremors of nervous system     Past Surgical History:  Procedure Laterality Date  . COLONOSCOPY N/A 12/16/2012   Dr. Jena Gaussourk- internal hemorrhoids otherwise normal. Screening in 2024.   [name]. HEMORRHOID BANDING    . None      Prior to Admission medications   Medication Sig Start Date End Date Taking? Authorizing Provider  allopurinol (ZYLOPRIM) 300 MG tablet Take 300 mg by mouth every morning.    Yes [provider]  atorvastatin (LIPITOR) 20 MG tablet Take 20 mg by mouth daily.   Yes [provider]  colchicine 0.6 MG tablet Take 0.6 mg by mouth every morning.    Yes [provider]  gabapentin (NEURONTIN) 300 MG capsule Take 600 mg by mouth 2 (two) times daily.    Yes [provider]  hydrocortisone (PROCTO-MED HC) 2.5 % rectal cream APPLY RECTALLY TWICE DAILY AS NEEDED 07/05/17  Yes Gelene MinkBoone, Anna W, NP  insulin detemir (LEVEMIR) 100 UNIT/ML injection Inject 49 Units into the skin at bedtime.    Yes [provider]  linagliptin (TRADJENTA) 5 MG TABS tablet Take 5 mg by mouth daily.   Yes [provider]  lisinopril-hydrochlorothiazide (PRINZIDE,ZESTORETIC) 10-12.5 MG per tablet Take 1  tablet by mouth every morning.    Yes [provider]  loratadine (CLARITIN) 10 MG tablet Take 10 mg by mouth daily.   Yes [provider]  metFORMIN (GLUCOPHAGE) 500 MG tablet Take 2 tablets (1,000 mg total) by mouth 2 (two) times daily. Patient taking 2 tablets in the morning and 1 tablet in the evening. Patient taking differently: Take 500 mg by mouth 2 (two) times daily.  02/05/14  Yes Gerhard MunchLockwood, Jaci Desanto, MD    Allergies as of 05/15/2017  . (No Known Allergies)    Family History  Problem Relation Age of Onset  . Hypertension Mother   . Arthritis Unknown   . Colon cancer Neg Hx     Social History   Socioeconomic History  . Marital status: Divorced    Spouse name: Not on file  . Number of children: Not on file  . Years of education: 12th grade  . Highest education level: Not on file  Occupational History  . Occupation: unemployed  Social Needs  . Financial resource strain: Not on file  . Food insecurity:    Worry: Not on file    Inability: Not on file  . Transportation needs:    Medical: Not on file    Non-medical: Not on file  Tobacco Use  . Smoking status: Current Every Day Smoker    Packs/day: 0.50    Years: 30.00    Pack years:  15.00    Types: Cigarettes  . Smokeless tobacco: Never Used  . Tobacco comment: Smokes 6-7 cigarettes daily  Substance and Sexual Activity  . Alcohol use: Yes    Alcohol/week: 0.0 oz    Comment: occ. beer but rare   . Drug use: No  . Sexual activity: Yes    Birth control/protection: Condom  Lifestyle  . Physical activity:    Days per week: Not on file    Minutes per session: Not on file  . Stress: Not on file  Relationships  . Social connections:    Talks on phone: Not on file    Gets together: Not on file    Attends religious service: Not on file    Active member of club or organization: Not on file    Attends meetings of clubs or organizations: Not on file    Relationship status: Not on file  . Intimate  partner violence:    Fear of current or ex partner: Not on file    Emotionally abused: Not on file    Physically abused: Not on file    Forced sexual activity: Not on file  Other Topics Concern  . Not on file  Social History Narrative  . Not on file    Review of Systems: See HPI, otherwise negative ROS  Physical Exam: BP (!) 134/94   Pulse 74   Temp 98.1 F (36.7 C)   Resp 19   Ht 6\' 4"  (1.93 m)   Wt 293 lb (132.9 kg)   SpO2 99%   BMI 35.67 kg/m  General:   Alert,  Well-developed, well-nourished, pleasant and cooperative in NAD Neck:  Supple; no masses or thyromegaly. No significant cervical adenopathy. Lungs:  Clear throughout to auscultation.   No wheezes, crackles, or rhonchi. No acute distress. Heart:  Regular rate and rhythm; no murmurs, clicks, rubs,  or gallops. Abdomen: Non-distended, normal bowel sounds.  Soft and nontender without appreciable mass or hepatosplenomegaly.  Pulses:  Normal pulses noted. Extremities:  Without clubbing or edema.  Impression:  Pleasant 47 year old gentleman with recurrent rectal bleeding. He is here for colonoscopy  Recommendations:  I have offered the patient a diagnostic colonoscopy today. The risks, benefits, limitations, alternatives and imponderables have been reviewed with the patient. Questions have been answered. All parties are agreeable.      Notice: This dictation was prepared with Dragon dictation along with smaller phrase technology. Any transcriptional errors that result from this process are unintentional and may not be corrected upon review.

## 2017-07-11 NOTE — Discharge Instructions (Signed)
°  Colonoscopy Discharge Instructions  Read the instructions outlined below and refer to this sheet in the next few weeks. These discharge instructions provide you with general information on caring for yourself after you leave the hospital. Your doctor may also give you specific instructions. While your treatment has been planned according to the most current medical practices available, unavoidable complications occasionally occur. If you have any problems or questions after discharge, call Dr. Jena Gaussourk at [phone]. ACTIVITY  You may resume your regular activity, but move at a slower pace for the next 24 hours.   Take frequent rest periods for the next 24 hours.   Walking will help get rid of the air and reduce the bloated feeling in your belly (abdomen).   No driving for 24 hours (because of the medicine (anesthesia) used during the test).    Do not sign any important legal documents or operate any machinery for 24 hours (because of the anesthesia used during the test).  NUTRITION  Drink plenty of fluids.   You may resume your normal diet as instructed by your doctor.   Begin with a light meal and progress to your normal diet. Heavy or fried foods are harder to digest and may make you feel sick to your stomach (nauseated).   Avoid alcoholic beverages for 24 hours or as instructed.  MEDICATIONS  You may resume your normal medications unless your doctor tells you otherwise.  WHAT YOU CAN EXPECT TODAY  Some feelings of bloating in the abdomen.   Passage of more gas than usual.   Spotting of blood in your stool or on the toilet paper.  IF YOU HAD POLYPS REMOVED DURING THE COLONOSCOPY:  No aspirin products for 7 days or as instructed.   No alcohol for 7 days or as instructed.   Eat a soft diet for the next 24 hours.  FINDING OUT THE RESULTS OF YOUR TEST Not all test results are available during your visit. If your test results are not back during the visit, make an appointment  with your caregiver to find out the results. Do not assume everything is normal if you have not heard from your caregiver or the medical facility. It is important for you to follow up on all of your test results.  SEEK IMMEDIATE MEDICAL ATTENTION IF:  You have more than a spotting of blood in your stool.   Your belly is swollen (abdominal distention).   You are nauseated or vomiting.   You have a temperature over 101.   You have abdominal pain or discomfort that is severe or gets worse throughout the day.    Repeat colonoscopy for screening purposes in 10 years  Hemorrhoid literature provided  Stop topical hydrocortisone; trial of Canasa suppositories 1 per rectum at bedtime for hemorrhoids  Office visit with us in 4-6 weeks

## 2017-07-12 ENCOUNTER — Encounter (HOSPITAL_COMMUNITY): Payer: Self-pay | Admitting: *Deleted

## 2017-07-12 ENCOUNTER — Emergency Department (HOSPITAL_COMMUNITY): Payer: Medicaid Other

## 2017-07-12 ENCOUNTER — Other Ambulatory Visit: Payer: Self-pay

## 2017-07-12 ENCOUNTER — Emergency Department (HOSPITAL_COMMUNITY)
Admission: EM | Admit: 2017-07-12 | Discharge: 2017-07-12 | Disposition: A | Discharge status: Home / Self Care | Payer: Medicaid Other | Attending: Emergency Medicine | Admitting: Emergency Medicine

## 2017-07-12 DIAGNOSIS — F17210 Nicotine dependence, cigarettes, uncomplicated: Secondary | ICD-10-CM | POA: Insufficient documentation

## 2017-07-12 DIAGNOSIS — M545 Low back pain: Secondary | ICD-10-CM | POA: Insufficient documentation

## 2017-07-12 DIAGNOSIS — Z79899 Other long term (current) drug therapy: Secondary | ICD-10-CM | POA: Insufficient documentation

## 2017-07-12 DIAGNOSIS — M25511 Pain in right shoulder: Secondary | ICD-10-CM | POA: Diagnosis not present

## 2017-07-12 DIAGNOSIS — G8929 Other chronic pain: Secondary | ICD-10-CM

## 2017-07-12 DIAGNOSIS — Z7984 Long term (current) use of oral hypoglycemic drugs: Secondary | ICD-10-CM | POA: Diagnosis not present

## 2017-07-12 DIAGNOSIS — I10 Essential (primary) hypertension: Secondary | ICD-10-CM | POA: Insufficient documentation

## 2017-07-12 DIAGNOSIS — E119 Type 2 diabetes mellitus without complications: Secondary | ICD-10-CM | POA: Diagnosis not present

## 2017-07-12 LAB — BASIC METABOLIC PANEL
ANION GAP: 10 (ref 5–15)
BUN: 9 mg/dL (ref 6–20)
CO2: 25 mmol/L (ref 22–32)
Calcium: 9.3 mg/dL (ref 8.9–10.3)
Chloride: 105 mmol/L (ref 101–111)
Creatinine, Ser: 1.06 mg/dL (ref 0.61–1.24)
GFR calc non Af Amer: >60 mL/min (ref >60)
Glucose, Bld: 98 mg/dL (ref 65–99)
POTASSIUM: 3.8 mmol/L (ref 3.5–5.1)
SODIUM: 140 mmol/L (ref 135–145)

## 2017-07-12 LAB — CBC
HCT: 45.3 % (ref 39.0–52.0)
Hemoglobin: 14.8 g/dL (ref 13.0–17.0)
MCH: 29 pg (ref 26.0–34.0)
MCHC: 32.7 g/dL (ref 30.0–36.0)
MCV: 88.8 fL (ref 78.0–100.0)
PLATELETS: 180 10*3/uL (ref 150–400)
RBC: 5.1 MIL/uL (ref 4.22–5.81)
RDW: 13.3 % (ref 11.5–15.5)
WBC: 5.2 10*3/uL (ref 4.0–10.5)

## 2017-07-12 MED ORDER — CYCLOBENZAPRINE HCL 5 MG PO TABS: 5.0000 mg | ORAL_TABLET | Freq: Every evening | ORAL | 0 refills | Status: DC | PRN

## 2017-07-12 MED ORDER — MELOXICAM 7.5 MG PO TABS: 7.5000 mg | ORAL_TABLET | Freq: Every day | ORAL | 0 refills | Status: DC

## 2017-07-12 NOTE — ED Triage Notes (Signed)
Pt c/o low back pain x 1 year and right shoulder pain x 2 weeks. Pt reports the pain is worse today than normal. Denies injury.

## 2017-07-12 NOTE — ED Provider Notes (Signed)
[facility] EMERGENCY DEPARTMENT Provider Note   CSN: [id number] Arrival date & time: 07/12/17  1055     History   Chief Complaint Chief Complaint  Patient presents with  . Back Pain    HPI Daniel Nicholson is a 47 y.o. male presenting for evaluation of back pain and right shoulder pain.  Patient states for the past year, he has had bilateral back pain.  It initially was intermittent, but has become more constant.  He has had it evaluated by his primary care doctor, who recommended that he loses weight to improve his back pain.  He denies fall, trauma, or injury.  Pain began gradually.  Icy hot topical rub improves his pain, palpation makes it worse.  He has not tried any Tylenol or ibuprofen to help with his pain.  He denies fevers, chills, loss of bowel or bladder control, numbness, tingling, history of cancer, history of IV drug use.  Initially, patient presented with 2-week history of right shoulder pain.  This also began gradually, and initially was intermittent.  It is now constant and sharp.  Movement and palpation makes it worse, nothing makes it better.  No radiation of the pain down his arm.  No numbness or tingling.  He is not dropping things.  He has not taken anything for this.  He does not have an orthopedic doctor. Pt is concerned his kidneys are the source of his pain. No dysuria, hematuria, or other abnormal urination.  Requesting blood work.  HPI  Past Medical History:  Diagnosis Date  . Acid reflux   . Diabetes mellitus without complication (HCC)   . Gout   . Hemorrhoids    s/p right posterior, left lateral, right anterior banding. Last banding procedure Jan 2015.   [name] Hypertension   . Neck pain   . Poor circulation   . S/P tooth extraction 08/16/2016   three teeth extracted  . Tremors of nervous system     Patient Active Problem List   Diagnosis Date Noted  . Hemorrhoids 05/14/2017  . Constipation 05/14/2017  . Rhabdomyolysis 12/27/2016  . AKI (acute  kidney injury) (HCC) 12/27/2016  . Fatty liver 12/05/2012  . Rectal bleeding 12/05/2012  . Gross hematuria 12/05/2012  . 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 Acid reflux   . Gout   . FOOT PAIN 01/30/2008    Past Surgical History:  Procedure Laterality Date  . COLONOSCOPY N/A 12/16/2012   Dr. Jena Gauss- internal hemorrhoids otherwise normal. Screening in 2024.   [name] HEMORRHOID BANDING    . None          Home Medications    Prior to Admission medications   Medication Sig Start Date End Date Taking? Authorizing Provider  allopurinol (ZYLOPRIM) 300 MG tablet Take 300 mg by mouth every morning.     [provider]  atorvastatin (LIPITOR) 20 MG tablet Take 20 mg by mouth daily.    [provider]  colchicine 0.6 MG tablet Take 0.6 mg by mouth every morning.     [provider]  cyclobenzaprine (FLEXERIL) 5 MG tablet Take 1 tablet (5 mg total) by mouth at bedtime as needed for muscle spasms. 07/12/17   Jeovanni Heuring, PA-C  gabapentin (NEURONTIN) 300 MG capsule Take 600 mg by mouth 2 (two) times daily.     [provider]  insulin detemir (LEVEMIR) 100 UNIT/ML injection Inject 49 Units into the skin at bedtime.     [provider]  linagliptin (TRADJENTA) 5 MG  TABS tablet Take 5 mg by mouth daily.    [provider]  lisinopril-hydrochlorothiazide (PRINZIDE,ZESTORETIC) 10-12.5 MG per tablet Take 1 tablet by mouth every morning.     [provider]  loratadine (CLARITIN) 10 MG tablet Take 10 mg by mouth daily.    [provider]  meloxicam (MOBIC) 7.5 MG tablet Take 1 tablet (7.5 mg total) by mouth daily. 07/12/17   Piccola Arico, PA-C  metFORMIN (GLUCOPHAGE) 500 MG tablet Take 2 tablets (1,000 mg total) by mouth 2 (two) times daily. Patient taking 2 tablets in the morning and 1 tablet in the evening. Patient taking differently: Take 500 mg by mouth 2 (two) times daily.  02/05/14   Gerhard Munch, MD    Family  History Family History  Problem Relation Age of Onset  . Hypertension Mother   . Arthritis Unknown   . Colon cancer Neg Hx     Social History Social History   Tobacco Use  . Smoking status: Current Every Day Smoker    Packs/day: 0.50    Years: 30.00    Pack years: 15.00    Types: Cigarettes  . Smokeless tobacco: Never Used  . Tobacco comment: Smokes 6-7 cigarettes daily  Substance Use Topics  . Alcohol use: Yes    Alcohol/week: 0.0 oz    Comment: occ. beer but rare   . Drug use: No     Allergies   Patient has no known allergies.   Review of Systems Review of Systems  Constitutional: Negative for chills and fever.  Musculoskeletal: Positive for arthralgias and back pain.  Skin: Negative for wound.  Neurological: Negative for numbness.  Hematological: Does not bruise/bleed easily.     Physical Exam Updated Vital Signs BP 125/84 (BP Location: Left Arm)   Pulse 75   Temp 98.6 F (37 C) (Oral)   Resp 16   Ht 6\' 4"  (1.93 m)   Wt 132.9 kg (293 lb)   SpO2 100%   BMI 35.67 kg/m   Physical Exam  Constitutional: He is oriented to person, place, and time. He appears well-developed and well-nourished. No distress.  HENT:  Head: Normocephalic and atraumatic.  Eyes: EOM are normal.  Neck: Normal range of motion.  No TTP of cspine  Cardiovascular: Normal rate, regular rhythm and intact distal pulses.  Pulmonary/Chest: Effort normal and breath sounds normal. No respiratory distress. He has no wheezes.  Abdominal: He exhibits no distension.  Musculoskeletal: Normal range of motion. He exhibits tenderness. He exhibits no edema or deformity.  Tenderness to palpation of the right shoulder musculature and bony aspects.  No obvious deformity, warmth, or swelling.  Full active range of motion without pain.  Grip strength intact bilaterally.  Sensation intact bilaterally.  Radial pulses equal bilaterally. Tenderness to palpation of bilateral lower back musculature without  increased pain over midline spine.  No tenderness to palpation of upper back musculature.  No CVA tenderness.  No obvious deformity or step-offs.  Neurological: He is alert and oriented to person, place, and time. He displays normal reflexes. No sensory deficit.  Skin: Skin is warm. No rash noted.  Psychiatric: He has a normal mood and affect.  Nursing note and vitals reviewed.    ED Treatments / Results  Labs (all labs ordered are listed, but only abnormal results are displayed) Labs Reviewed  CBC  BASIC METABOLIC PANEL    EKG None  Radiology Dg Lumbar Spine Complete  Result Date: 07/12/2017 CLINICAL DATA:  Low back pain for  the past 1-2 years. No known injury. EXAM: LUMBAR SPINE - COMPLETE 4+ VIEW COMPARISON:  None. FINDINGS: There are 5 non rib-bearing lumbar type vertebral bodies. Normal alignment of the lumbar spine. No anterolisthesis or retrolisthesis. No definite pars defects. Lumbar vertebral body heights appear preserved. Lumbar intervertebral disc space heights appear preserved. Limited visualization of the bilateral SI joints is normal. Regional bowel gas pattern and soft tissues appear normal. IMPRESSION: No explanation for patient's chronic low back pain. Electronically Signed   By: Simonne ComeJohn  Watts M.D.   On: 07/12/2017 12:13   Dg Shoulder Right  Result Date: 07/12/2017 CLINICAL DATA:  Right shoulder pain for the past 2-3 weeks. No known injury. EXAM: RIGHT SHOULDER - 2+ VIEW COMPARISON:  07/08/2012 FINDINGS: No fracture or dislocation. Glenohumeral and acromioclavicular joint spaces appear preserved. No evidence of calcific tendinitis. Limited visualization of the adjacent thorax is normal. Regional soft tissues appear normal. IMPRESSION: No explanation for patient's right shoulder pain. Electronically Signed   By: Simonne ComeJohn  Watts M.D.   On: 07/12/2017 12:11    Procedures Procedures (including critical care time)  Medications Ordered in ED Medications - No data to  display   Initial Impression / Assessment and Plan / ED Course  I have reviewed the triage vital signs and the nursing notes.  Pertinent labs & imaging results that were available during my care of the patient were reviewed by me and considered in my medical decision making (see chart for details).     Patient presenting for evaluation of chronic low back pain and newer onset right shoulder pain.  Physical exam reassuring, he is neurovascularly intact.  No red flags of back pain.  Likely muscular.  However, his back pain has been going on for a year, will obtain x-rays of the spine and shoulder.  Patient requesting blood work, will order CBC and BMP.  X-rays viewed and interpreted by me, no sign of fracture, dislocation, or arthritis.  No vertebral abnormalities. At this time, doubt vertebral injury, infection, spinal cord compression, myelopathy, or cauda equina syndrome.  Labs reassuring, kidney function stable.  Discussed findings with patient.  Discussed that this is likely muscular.  Will trial Mobic and Flexeril for symptom control.  Follow-up with PCP and/or orthopedics as needed.  At this time, patient appears safe for discharge.  Return precautions given.  Patient states he understands and agrees to plan.   Final Clinical Impressions(s) / ED Diagnoses   Final diagnoses:  Chronic bilateral low back pain without sciatica  Acute pain of right shoulder    ED Discharge Orders        Ordered    meloxicam (MOBIC) 7.5 MG tablet  Daily     07/12/17 1259    cyclobenzaprine (FLEXERIL) 5 MG tablet  At bedtime PRN     07/12/17 1259       Jash Wahlen, PA-C 07/12/17 1334    Vanetta MuldersZackowski, Scott, MD 07/13/17 0740

## 2017-07-12 NOTE — Discharge Instructions (Signed)
Take mobic daily with meals.  Do not take other anti-inflammatories at the same time open (Advil, Motrin, ibuprofen, naproxen, Aleve). You may supplement with Tylenol if you need further pain control. Use flexeril as needed for pain and soreness. Have caution, as this may make you tired or groggy. Do not dive or operate heavy machinery while taking this medication.  Follow-up with your primary care doctor to help you establish care with an orthopedic doctor.  Or you may contact Dr. Luvenia StarchHaddix's office and set up an appointment for further evaluation of your pain if it is not improving in 1 week. Return to the emergency room if you develop numbness, loss of bowel or bladder control, or any new or concerning symptoms.

## 2017-07-16 ENCOUNTER — Encounter (HOSPITAL_COMMUNITY): Payer: Self-pay | Admitting: Internal Medicine

## 2017-07-30 ENCOUNTER — Ambulatory Visit: Payer: Medicaid Other | Admitting: Nurse Practitioner

## 2017-08-11 ENCOUNTER — Other Ambulatory Visit: Payer: Self-pay | Admitting: Internal Medicine

## 2017-08-18 ENCOUNTER — Emergency Department (HOSPITAL_COMMUNITY)
Admission: EM | Admit: 2017-08-18 | Discharge: 2017-08-19 | Disposition: A | Discharge status: Home / Self Care | Payer: Medicaid Other | Attending: Emergency Medicine | Admitting: Emergency Medicine

## 2017-08-18 ENCOUNTER — Other Ambulatory Visit: Payer: Self-pay

## 2017-08-18 ENCOUNTER — Encounter (HOSPITAL_COMMUNITY): Payer: Self-pay | Admitting: Emergency Medicine

## 2017-08-18 DIAGNOSIS — Z7984 Long term (current) use of oral hypoglycemic drugs: Secondary | ICD-10-CM | POA: Diagnosis not present

## 2017-08-18 DIAGNOSIS — F17210 Nicotine dependence, cigarettes, uncomplicated: Secondary | ICD-10-CM | POA: Insufficient documentation

## 2017-08-18 DIAGNOSIS — Z79899 Other long term (current) drug therapy: Secondary | ICD-10-CM | POA: Insufficient documentation

## 2017-08-18 DIAGNOSIS — I10 Essential (primary) hypertension: Secondary | ICD-10-CM | POA: Insufficient documentation

## 2017-08-18 DIAGNOSIS — E119 Type 2 diabetes mellitus without complications: Secondary | ICD-10-CM | POA: Insufficient documentation

## 2017-08-18 DIAGNOSIS — F410 Panic disorder [episodic paroxysmal anxiety] without agoraphobia: Secondary | ICD-10-CM | POA: Diagnosis not present

## 2017-08-18 MED ORDER — LORAZEPAM 1 MG PO TABS: 1.0000 mg | ORAL_TABLET | Freq: Four times a day (QID) | ORAL | 0 refills | Status: DC | PRN

## 2017-08-18 MED ORDER — LORAZEPAM 1 MG PO TABS
1.0000 mg | ORAL_TABLET | Freq: Once | ORAL | Status: AC
  Administered 2017-08-18: 1 mg via ORAL
  Filled 2017-08-18: qty 1

## 2017-08-18 NOTE — ED Provider Notes (Signed)
[facility] EMERGENCY DEPARTMENT Provider Note   CSN: [id number] Arrival date & time: 08/18/17  2214     History   Chief Complaint Chief Complaint  Patient presents with  . Panic Attack    HPI Daniel Nicholson is a 47 y.o. male.  HPI   He is here tonight for evaluation of a possible panic attack.  He has not had one for a couple of months.  His panic attacks are typically a sense of impending doom associated with an anxious feeling.  He denies chest pain, shortness of breath, nausea, vomiting, diaphoresis, focal weakness or paresthesia.  Presents for evaluation by EMS.  He has been treated successfully using a short acting benzodiazepine from the emergency department in the past.  His PCP has prescribed a once a day medication, but he is out of that.  The patient would prefer to take as needed benzodiazepine for his discomfort.  Patient is not currently working.  There are no other known modifying factors.  Past Medical History:  Diagnosis Date  . Acid reflux   . Diabetes mellitus without complication (HCC)   . Gout   . Hemorrhoids    s/p right posterior, left lateral, right anterior banding. Last banding procedure Jan 2015.   [name] Hypertension   . Neck pain   . Poor circulation   . S/P tooth extraction 08/16/2016   three teeth extracted  . Tremors of nervous system     Patient Active Problem List   Diagnosis Date Noted  . Hemorrhoids 05/14/2017  . Constipation 05/14/2017  . Rhabdomyolysis 12/27/2016  . AKI (acute kidney injury) (HCC) 12/27/2016  . Fatty liver 12/05/2012  . Rectal bleeding 12/05/2012  . Gross hematuria 12/05/2012  . 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 Acid reflux   . Gout   . FOOT PAIN 01/30/2008    Past Surgical History:  Procedure Laterality Date  . COLONOSCOPY N/A 12/16/2012   Dr. Jena Gauss- internal hemorrhoids otherwise normal. Screening in 2024.   [name] COLONOSCOPY N/A 07/11/2017   Procedure: COLONOSCOPY;  Surgeon: Corbin Ade, MD;  Location: [facility];  Service: Endoscopy;  Laterality: N/A;  8:15am  . HEMORRHOID BANDING    . None          Home Medications    Prior to Admission medications   Medication Sig Start Date End Date Taking? Authorizing Provider  allopurinol (ZYLOPRIM) 300 MG tablet Take 300 mg by mouth every morning.     [provider]  atorvastatin (LIPITOR) 20 MG tablet Take 20 mg by mouth daily.    [provider]  CANASA 1000 MG suppository INSERT 1 SUPPOSITORY RECTALLY AT BEDTIME FOR 30 DAYS. 08/13/17   Gelene Mink, NP  colchicine 0.6 MG tablet Take 0.6 mg by mouth every morning.     [provider]  cyclobenzaprine (FLEXERIL) 5 MG tablet Take 1 tablet (5 mg total) by mouth at bedtime as needed for muscle spasms. 07/12/17   Caccavale, Sophia, PA-C  gabapentin (NEURONTIN) 300 MG capsule Take 600 mg by mouth 2 (two) times daily.     [provider]  insulin detemir (LEVEMIR) 100 UNIT/ML injection Inject 49 Units into the skin at bedtime.     [provider]  linagliptin (TRADJENTA) 5 MG TABS tablet Take 5 mg by mouth daily.    [provider]  lisinopril-hydrochlorothiazide (PRINZIDE,ZESTORETIC) 10-12.5 MG per tablet Take 1 tablet by mouth every morning.     [provider]  loratadine (CLARITIN)  10 MG tablet Take 10 mg by mouth daily.    [provider]  LORazepam (ATIVAN) 1 MG tablet Take 1 tablet (1 mg total) by mouth every 6 (six) hours as needed for anxiety. 08/18/17   Mancel Bale, MD  meloxicam (MOBIC) 7.5 MG tablet Take 1 tablet (7.5 mg total) by mouth daily. 07/12/17   Caccavale, Sophia, PA-C  metFORMIN (GLUCOPHAGE) 500 MG tablet Take 2 tablets (1,000 mg total) by mouth 2 (two) times daily. Patient taking 2 tablets in the morning and 1 tablet in the evening. Patient taking differently: Take 500 mg by mouth 2 (two) times daily.  02/05/14   Gerhard Munch, MD    Family History Family History  Problem Relation Age of Onset  . Hypertension  Mother   . Arthritis Unknown   . Colon cancer Neg Hx     Social History Social History   Tobacco Use  . Smoking status: Current Every Day Smoker    Packs/day: 0.50    Years: 30.00    Pack years: 15.00    Types: Cigarettes  . Smokeless tobacco: Never Used  . Tobacco comment: Smokes 6-7 cigarettes daily  Substance Use Topics  . Alcohol use: Yes    Alcohol/week: 0.0 oz    Comment: occ. beer but rare   . Drug use: No     Allergies   Patient has no known allergies.   Review of Systems Review of Systems  All other systems reviewed and are negative.    Physical Exam Updated Vital Signs BP (!) 138/94 (BP Location: Right Arm)   Pulse 77   Temp 98.2 F (36.8 C) (Oral)   Resp 17   Ht 6' (1.829 m)   Wt 113.4 kg (250 lb)   SpO2 96%   BMI 33.91 kg/m   Physical Exam  Constitutional: He is oriented to person, place, and time. He appears well-developed and well-nourished. He appears distressed (Restless, uncomfortable).  HENT:  Head: Normocephalic and atraumatic.  Right Ear: External ear normal.  Left Ear: External ear normal.  Eyes: Pupils are equal, round, and reactive to light. Conjunctivae and EOM are normal.  Neck: Normal range of motion and phonation normal. Neck supple.  Cardiovascular: Normal rate, regular rhythm and normal heart sounds.  Pulmonary/Chest: Effort normal and breath sounds normal. He exhibits no bony tenderness.  Musculoskeletal: Normal range of motion.  Neurological: He is alert and oriented to person, place, and time. No cranial nerve deficit or sensory deficit. He exhibits normal muscle tone. Coordination normal.  Skin: Skin is warm, dry and intact.  Psychiatric: His behavior is normal. Judgment and thought content normal.  Mildly anxious  Nursing note and vitals reviewed.    ED Treatments / Results  Labs (all labs ordered are listed, but only abnormal results are displayed) Labs Reviewed - No data to display  EKG None  Radiology No  results found.  Procedures Procedures (including critical care time)  Medications Ordered in ED Medications  LORazepam (ATIVAN) tablet 1 mg (1 mg Oral Given 08/18/17 2311)     Initial Impression / Assessment and Plan / ED Course  I have reviewed the triage vital signs and the nursing notes.  Pertinent labs & imaging results that were available during my care of the patient were reviewed by me and considered in my medical decision making (see chart for details).      Patient Vitals for the past 24 hrs:  BP Temp Temp src Pulse Resp SpO2 Height Weight  08/18/17 2219 (!) 138/94 98.2 F (36.8 C) Oral 77 17 96 % 6' (1.829 m) 113.4 kg (250 lb)    10:51 PM Reevaluation with update and discussion. After initial assessment and treatment, an updated evaluation reveals no change in clinical status.  Findings discussed with the patient and all questions were answered. Mancel Bale   Medical Decision Making: Recurrent anxiety with panic attack.  Doubt acute psychosis, metabolic instability or impending vascular collapse.  CRITICAL CARE-no Performed by: Mancel Bale   Nursing Notes Reviewed/ Care Coordinated Applicable Imaging Reviewed Interpretation of Laboratory Data incorporated into ED treatment  The patient appears reasonably screened and/or stabilized for discharge and I doubt any other medical condition or other [facility] requiring further screening, evaluation, or treatment in the ED at this time prior to discharge.  Plan: Home Medications-continue usual medications; Home Treatments-rest, fluids; return here if the recommended treatment, does not improve the symptoms; Recommended follow up-PCP of choice for ongoing management.  Consider second opinion.     Final Clinical Impressions(s) / ED Diagnoses   Final diagnoses:  Panic attack    ED Discharge Orders        Ordered    LORazepam (ATIVAN) 1 MG tablet  Every 6 hours PRN     08/18/17 2328       Mancel Bale,  MD 08/18/17 [phone]

## 2017-08-18 NOTE — ED Triage Notes (Signed)
Pt with a history of panic attacks   Watching TV and felt that he was smothering

## 2017-08-18 NOTE — ED Notes (Signed)
Pt states he feels like the ativan has helped his anxiety.

## 2017-08-18 NOTE — Discharge Instructions (Addendum)
Take your medicine as directed.  Make sure that you are getting plenty of rest and drinking a lot of fluids.  Follow-up with your doctor or consider getting a second opinion from another doctor if you continue to have problems with panic.

## 2017-08-27 ENCOUNTER — Encounter: Payer: Self-pay | Admitting: Gastroenterology

## 2017-08-27 ENCOUNTER — Ambulatory Visit: Payer: Medicaid Other | Admitting: Gastroenterology

## 2017-08-27 VITALS — BP 113/74 | HR 92 | Temp 97.6°F | Ht 76.0 in | Wt 272.6 lb

## 2017-08-27 DIAGNOSIS — K640 First degree hemorrhoids: Secondary | ICD-10-CM

## 2017-08-27 NOTE — Progress Notes (Signed)
cc'ed to pcp °

## 2017-08-27 NOTE — Patient Instructions (Signed)
1. Return for hemorrhoid banding with Dr. Jena Gauss. In the interim, once you complete the Canasa suppositories, you can go back to using hemorrhoid creams as needed.

## 2017-08-27 NOTE — Progress Notes (Signed)
Primary Care Physician: Gwenyth Bender, MD  Primary Gastroenterologist:  Roetta Sessions, MD   Chief Complaint  Patient presents with  . Rectal Bleeding    occas episodes, rectum gets irritated/itching at times    HPI: Daniel Nicholson is a 47 y.o. male here for follow-up of hemorrhoids.  Colonoscopy done back in April for rectal bleeding and irritation showed nonbleeding grade 1 internal hemorrhoids.  Patient underwent hemorrhoid banding the latter part of 2014, early 2015.  At the time he states he had really good results but symptoms came back last year.   As long as he is using the Canasa suppositories or hemorrhoid creams,  his itching and rectal bleeding resolves but as soon as he stops the medication it comes back.  He wants to pursue further hemorrhoid banding.  He denies any constipation.  His bowel movements are regular.  Denies abdominal pain.  Appetite is okay.  No vomiting.  No known weight loss although his weight is quite variable when looking through epic, 40 pound difference even within a 3 to 4-week timeframe therefore indicating erroneous results.  Current Outpatient Medications  Medication Sig Dispense Refill  . allopurinol (ZYLOPRIM) 300 MG tablet Take 300 mg by mouth every morning.     [name] atorvastatin (LIPITOR) 20 MG tablet Take 20 mg by mouth daily.    [name] CANASA 1000 MG suppository INSERT 1 SUPPOSITORY RECTALLY AT BEDTIME FOR 30 DAYS. 30 suppository 0  . colchicine 0.6 MG tablet Take 0.6 mg by mouth every morning.     . cyclobenzaprine (FLEXERIL) 5 MG tablet Take 1 tablet (5 mg total) by mouth at bedtime as needed for muscle spasms. 10 tablet 0  . gabapentin (NEURONTIN) 300 MG capsule Take 600 mg by mouth 2 (two) times daily.     . insulin detemir (LEVEMIR) 100 UNIT/ML injection Inject 49 Units into the skin at bedtime.     [name] linagliptin (TRADJENTA) 5 MG TABS tablet Take 5 mg by mouth daily.    [name] lisinopril-hydrochlorothiazide (PRINZIDE,ZESTORETIC) 10-12.5 MG per  tablet Take 1 tablet by mouth every morning.     . loratadine (CLARITIN) 10 MG tablet Take 10 mg by mouth daily.    [name] LORazepam (ATIVAN) 1 MG tablet Take 1 tablet (1 mg total) by mouth every 6 (six) hours as needed for anxiety. 10 tablet 0  . metFORMIN (GLUCOPHAGE) 500 MG tablet Take 2 tablets (1,000 mg total) by mouth 2 (two) times daily. Patient taking 2 tablets in the morning and 1 tablet in the evening. (Patient taking differently: Take 250 mg by mouth 2 (two) times daily. ) 60 tablet 0   No current facility-administered medications for this visit.     Allergies as of 08/27/2017  . (No Known Allergies)    ROS:  General: Negative for anorexia, weight loss, fever, chills, fatigue, weakness. ENT: Negative for hoarseness, difficulty swallowing , nasal congestion. CV: Negative for chest pain, angina, palpitations, dyspnea on exertion, peripheral edema.  Respiratory: Negative for dyspnea at rest, dyspnea on exertion, cough, sputum, wheezing.  GI: See history of present illness. GU:  Negative for dysuria, hematuria, urinary incontinence, urinary frequency, nocturnal urination.  Endo: Negative for unusual weight change.    Physical Examination:   BP 113/74   Pulse 92   Temp 97.6 F (36.4 C) (Oral)   Ht  (1.93 m)   Wt 272 lb 9.6 oz (123.7 kg)   BMI 33.18 kg/m   General: Well-nourished, well-developed  in no acute distress.  Eyes: No icterus. Mouth: Oropharyngeal mucosa moist and pink , no lesions erythema or exudate. Lungs: Clear to auscultation bilaterally.  Heart: Regular rate and rhythm, no murmurs rubs or gallops.  Abdomen: Bowel sounds are normal, nontender, nondistended, no hepatosplenomegaly or masses, no abdominal bruits or hernia , no rebound or guarding.   Extremities: No lower extremity edema. No clubbing or deformities. Neuro: Alert and oriented x 4   Skin: Warm and dry, no jaundice.   Psych: Alert and cooperative, normal mood and affect.  Labs:  Lab Results    Component Value Date   CREATININE 1.06 07/12/2017   BUN 9 07/12/2017   NA 140 07/12/2017   K 3.8 07/12/2017   CL 105 07/12/2017   CO2 25 07/12/2017   Lab Results  Component Value Date   ALT 24 07/05/2017   AST 29 07/05/2017   ALKPHOS 36 07/05/2017   BILITOT 0.8 07/05/2017   Lab Results  Component Value Date   WBC 5.2 07/12/2017   HGB 14.8 07/12/2017   HCT 45.3 07/12/2017   MCV 88.8 07/12/2017   PLT 180 07/12/2017    Imaging Studies: No results found.

## 2017-08-27 NOTE — Assessment & Plan Note (Addendum)
Grade 1 internal hemorrhoids improved with topical regimen but once discontinued he has recurrent symptoms.  He is interested in pursuing further hemorrhoid banding.  We will bring him back on Dr. Jena Gauss schedule for this.  In the interim he will continue Canasa suppositories until he has completed and then can use hemorrhoidal creams as needed.  Will NIC for next colonoscopy in 07/2027.

## 2017-09-17 ENCOUNTER — Emergency Department (HOSPITAL_COMMUNITY)
Admission: EM | Admit: 2017-09-17 | Discharge: 2017-09-17 | Disposition: A | Discharge status: Home / Self Care | Payer: Medicaid Other | Attending: Emergency Medicine | Admitting: Emergency Medicine

## 2017-09-17 ENCOUNTER — Encounter (HOSPITAL_COMMUNITY): Payer: Self-pay | Admitting: Emergency Medicine

## 2017-09-17 ENCOUNTER — Other Ambulatory Visit: Payer: Self-pay

## 2017-09-17 ENCOUNTER — Telehealth: Payer: Self-pay

## 2017-09-17 DIAGNOSIS — S39012A Strain of muscle, fascia and tendon of lower back, initial encounter: Secondary | ICD-10-CM | POA: Diagnosis not present

## 2017-09-17 DIAGNOSIS — Y929 Unspecified place or not applicable: Secondary | ICD-10-CM | POA: Insufficient documentation

## 2017-09-17 DIAGNOSIS — I10 Essential (primary) hypertension: Secondary | ICD-10-CM | POA: Diagnosis not present

## 2017-09-17 DIAGNOSIS — Y999 Unspecified external cause status: Secondary | ICD-10-CM | POA: Diagnosis not present

## 2017-09-17 DIAGNOSIS — Z79899 Other long term (current) drug therapy: Secondary | ICD-10-CM | POA: Diagnosis not present

## 2017-09-17 DIAGNOSIS — Z794 Long term (current) use of insulin: Secondary | ICD-10-CM | POA: Diagnosis not present

## 2017-09-17 DIAGNOSIS — S3992XA Unspecified injury of lower back, initial encounter: Secondary | ICD-10-CM | POA: Diagnosis present

## 2017-09-17 DIAGNOSIS — F17210 Nicotine dependence, cigarettes, uncomplicated: Secondary | ICD-10-CM | POA: Diagnosis not present

## 2017-09-17 DIAGNOSIS — X58XXXA Exposure to other specified factors, initial encounter: Secondary | ICD-10-CM | POA: Diagnosis not present

## 2017-09-17 DIAGNOSIS — Y939 Activity, unspecified: Secondary | ICD-10-CM | POA: Diagnosis not present

## 2017-09-17 DIAGNOSIS — E119 Type 2 diabetes mellitus without complications: Secondary | ICD-10-CM | POA: Insufficient documentation

## 2017-09-17 MED ORDER — METHOCARBAMOL 750 MG PO TABS: 750.0000 mg | ORAL_TABLET | Freq: Four times a day (QID) | ORAL | 0 refills | Status: DC

## 2017-09-17 MED ORDER — HYDROCODONE-ACETAMINOPHEN 5-325 MG PO TABS: 1.0000 | ORAL_TABLET | ORAL | 0 refills | Status: DC | PRN

## 2017-09-17 MED ORDER — NAPROXEN 500 MG PO TABS: 500.0000 mg | ORAL_TABLET | Freq: Two times a day (BID) | ORAL | 0 refills | Status: DC

## 2017-09-17 NOTE — Telephone Encounter (Signed)
Pt.notified

## 2017-09-17 NOTE — Discharge Instructions (Addendum)
Do not drive within 4 hours of taking hydrocodone as this will make you drowsy.  Avoid lifting,  Bending,  Twisting or any other activity that worsens your pain over the next week.  Apply a heating pad to your lower back for 15 minutes several times daily for the next 2 days.  You should get rechecked if your symptoms are not better over the next 5 days,  Or you develop increased pain,  Weakness in your leg(s) or loss of bladder or bowel function - these are symptoms of a worsening condition.

## 2017-09-17 NOTE — ED Triage Notes (Signed)
Patient reports low back pain that started 2 days ago. No radiation of symptoms, no known injury. No problems with bowel or bladder.

## 2017-09-17 NOTE — Telephone Encounter (Signed)
Pt called with complaints of severe lower back pain. Pt states on a scale from 1-10, 10 being the worse, his pain level is a 10+. Pt has been experiencing pain in his lower back for 2-3 months and his PCP has done blood work and urine samples. Pt says his pcp says the pain isn't coming from the kidneys. PT isn't sure if he can be referred to another facility to check on his pain. pts pain has worsened over the last 2 days. Pt was advised to go to the ED with the severe pain that he's experiencing. The Pt said they can't help him and is refusing to go the ED. Pt reports no nausea, no vomiting, just severe pain in his lower back x 2 days.

## 2017-09-17 NOTE — Telephone Encounter (Signed)
Unfortunately we don't generally manage lower back pain. His PCP would need to handle workup and referrals (possibly to Ortho?) if he is refusing ED care.

## 2017-09-19 NOTE — ED Provider Notes (Addendum)
[facility] EMERGENCY DEPARTMENT Provider Note   CSN: [id number] Arrival date & time: 09/17/17  1118     History   Chief Complaint Chief Complaint  Patient presents with  . Back Pain    HPI Daniel Nicholson is a 47 y.o. male with a history of DM, HTN, gout, GERD and occasional low back pain presenting with low back pain that started 2 days ago with gradual worsening pain and stiffness in his bilateral lower back, worsened when he first gets up in the morning and with standing from a seated position.  He denies specific injury but states occasional episodes of similar pain.  He has seen his pcp for this problem who recommended weight loss.  He has used icy hot and tylenol without relief of pain. He denies fevers, chill, abdominal pain or swelling, no loss of bladder or bowel control and he denies weakness or numbness in his legs.  He has no h/o IVDU or ca.   The history is provided by the patient.    Past Medical History:  Diagnosis Date  . Acid reflux   . Diabetes mellitus without complication (HCC)   . Gout   . Hemorrhoids    s/p right posterior, left lateral, right anterior banding. Last banding procedure Jan 2015.   [name]. Hypertension   . Neck pain   . Poor circulation   . S/P tooth extraction 08/16/2016   three teeth extracted  . Tremors of nervous system     Patient Active Problem List   Diagnosis Date Noted  . Hemorrhoids 05/14/2017  . Constipation 05/14/2017  . Rhabdomyolysis 12/27/2016  . AKI (acute kidney injury) (HCC) 12/27/2016  . Fatty liver 12/05/2012  . Rectal bleeding 12/05/2012  . Gross hematuria 12/05/2012  . 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 Acid reflux   . Gout   . FOOT PAIN 01/30/2008    Past Surgical History:  Procedure Laterality Date  . COLONOSCOPY N/A 12/16/2012   Dr. Jena Gaussourk- internal hemorrhoids otherwise normal. Screening in 2024.   [name]. COLONOSCOPY N/A 07/11/2017   Dr. Jena Gaussourk: Nonbleeding grade 1 internal hemorrhoids  . HEMORRHOID BANDING    .  None          Home Medications    Prior to Admission medications   Medication Sig Start Date End Date Taking? Authorizing Provider  allopurinol (ZYLOPRIM) 300 MG tablet Take 300 mg by mouth every morning.    Yes [provider]  atorvastatin (LIPITOR) 20 MG tablet Take 20 mg by mouth daily.   Yes [provider]  colchicine 0.6 MG tablet Take 0.6 mg by mouth every morning.    Yes [provider]  gabapentin (NEURONTIN) 300 MG capsule Take 600 mg by mouth 2 (two) times daily.    Yes [provider]  insulin detemir (LEVEMIR) 100 UNIT/ML injection Inject 49 Units into the skin at bedtime.    Yes [provider]  linagliptin (TRADJENTA) 5 MG TABS tablet Take 5 mg by mouth daily.   Yes [provider]  lisinopril-hydrochlorothiazide (PRINZIDE,ZESTORETIC) 10-12.5 MG per tablet Take 1 tablet by mouth every morning.    Yes [provider]  loratadine (CLARITIN) 10 MG tablet Take 10 mg by mouth daily.   Yes [provider]  Mesalamine (CANASA RE) Place 1 application rectally daily as needed.   Yes [provider]  metFORMIN (GLUCOPHAGE) 500 MG tablet Take 2 tablets (1,000 mg total) by mouth 2 (two) times daily. Patient taking 2 tablets in the  morning and 1 tablet in the evening. Patient taking differently: Take 250 mg by mouth 2 (two) times daily.  02/05/14  Yes Gerhard Munch, MD  CANASA 1000 MG suppository INSERT 1 SUPPOSITORY RECTALLY AT BEDTIME FOR 30 DAYS. 08/13/17   Gelene Mink, NP  cyclobenzaprine (FLEXERIL) 5 MG tablet Take 1 tablet (5 mg total) by mouth at bedtime as needed for muscle spasms. 07/12/17   Caccavale, Sophia, PA-C  HYDROcodone-acetaminophen (NORCO/VICODIN) 5-325 MG tablet Take 1 tablet by mouth every 4 (four) hours as needed for moderate pain or severe pain. 09/17/17   Bryen Hinderman, Raynelle Fanning, PA-C  LORazepam (ATIVAN) 1 MG tablet Take 1 tablet (1 mg total) by mouth every 6 (six) hours as needed for anxiety.  08/18/17   Mancel Bale, MD  methocarbamol (ROBAXIN-750) 750 MG tablet Take 1 tablet (750 mg total) by mouth 4 (four) times daily. 09/17/17   Burgess Amor, PA-C  naproxen (NAPROSYN) 500 MG tablet Take 1 tablet (500 mg total) by mouth 2 (two) times daily. 09/17/17   Burgess Amor, PA-C    Family History Family History  Problem Relation Age of Onset  . Hypertension Mother   . Arthritis Unknown   . Colon cancer Neg Hx     Social History Social History   Tobacco Use  . Smoking status: Current Every Day Smoker    Packs/day: 0.50    Years: 30.00    Pack years: 15.00    Types: Cigarettes  . Smokeless tobacco: Never Used  . Tobacco comment: Smokes 6-7 cigarettes daily  Substance Use Topics  . Alcohol use: Yes    Alcohol/week: 0.0 oz    Comment: occ. beer but rare   . Drug use: No     Allergies   Patient has no known allergies.   Review of Systems Review of Systems  Constitutional: Negative for fever.  Respiratory: Negative for shortness of breath.   Cardiovascular: Negative for chest pain and leg swelling.  Gastrointestinal: Negative for abdominal distention, abdominal pain and constipation.  Genitourinary: Negative for difficulty urinating, dysuria, flank pain, frequency and urgency.  Musculoskeletal: Positive for back pain. Negative for gait problem and joint swelling.  Skin: Negative for rash.  Neurological: Negative for weakness and numbness.     Physical Exam Updated Vital Signs BP (!) 129/99 (BP Location: Right Arm)   Pulse 69   Temp 98.2 F (36.8 C) (Oral)   Resp 18   Ht 6\' 4"  (1.93 m)   Wt 123.4 kg (272 lb)   SpO2 95%   BMI 33.11 kg/m   Physical Exam  Constitutional: He appears well-developed and well-nourished.  HENT:  Head: Normocephalic.  Eyes: Conjunctivae are normal.  Neck: Normal range of motion. Neck supple.  Cardiovascular: Normal rate and intact distal pulses.  Pedal pulses normal.  Pulmonary/Chest: Effort normal.  Abdominal: Soft. Bowel  sounds are normal. He exhibits no distension and no mass.  Musculoskeletal: Normal range of motion. He exhibits no edema.       Lumbar back: He exhibits tenderness. He exhibits no bony tenderness, no swelling, no edema and no spasm.  Neurological: He is alert. He has normal strength. He displays no atrophy and no tremor. No sensory deficit. Gait normal.  Reflex Scores:      Patellar reflexes are 2+ on the right side and 2+ on the left side.      Achilles reflexes are 2+ on the right side and 2+ on the left side. No strength deficit noted in hip and knee  flexor and extensor muscle groups.  Ankle flexion and extension intact.  Skin: Skin is warm and dry.  Psychiatric: He has a normal mood and affect.  Nursing note and vitals reviewed.    ED Treatments / Results  Labs (all labs ordered are listed, but only abnormal results are displayed) Labs Reviewed - No data to display  EKG None  Radiology No results found.  Procedures Procedures (including critical care time)  Medications Ordered in ED Medications - No data to display   Initial Impression / Assessment and Plan / ED Course  I have reviewed the triage vital signs and the nursing notes.  Pertinent labs & imaging results that were available during my care of the patient were reviewed by me and considered in my medical decision making (see chart for details).     Prior imaging reviewed. No neuro deficit on exam or by history to suggest emergent or surgical presentation. No suggestion of cauda equina, no fevers or localizing midline pain, doubt epidural abscess. No abd distention or pain, no findings to suggest AAA.  Discussed worsened sx that should prompt immediate re-evaluation including distal weakness, bowel/bladder retention/incontinence.    Final Clinical Impressions(s) / ED Diagnoses   Final diagnoses:  Strain of lumbar region, initial encounter    ED Discharge Orders        Ordered    methocarbamol (ROBAXIN-750)  750 MG tablet  4 times daily     09/17/17 1305    naproxen (NAPROSYN) 500 MG tablet  2 times daily     09/17/17 1305    HYDROcodone-acetaminophen (NORCO/VICODIN) 5-325 MG tablet  Every 4 hours PRN     09/17/17 1305       Burgess Amor, PA-C 09/19/17 2155    Burgess Amor, PA-C 09/19/17 2157    Blane Ohara, MD 09/25/17 [phone]

## 2017-11-27 ENCOUNTER — Ambulatory Visit: Payer: Medicaid Other | Admitting: Internal Medicine

## 2017-11-27 ENCOUNTER — Encounter: Payer: Self-pay | Admitting: Internal Medicine

## 2017-11-27 ENCOUNTER — Encounter

## 2017-11-27 VITALS — BP 126/81 | HR 78 | Temp 98.3°F | Ht 76.0 in | Wt 280.4 lb

## 2017-11-27 DIAGNOSIS — K641 Second degree hemorrhoids: Secondary | ICD-10-CM | POA: Diagnosis not present

## 2017-11-27 DIAGNOSIS — K648 Other hemorrhoids: Secondary | ICD-10-CM

## 2017-11-27 NOTE — Patient Instructions (Addendum)
Avoid straining.  Benefiber 1 tablespoon daily  Limit toilet time to 5 minutes  Call with any interim problems  Schedule followup appointment in 8 weeks from now   

## 2017-11-27 NOTE — Progress Notes (Signed)
[facility] banding procedure note:  The patient presents with symptomatic grade 2 hemorrhoids;  status post banding of 2 columns about 5 years ago. Did well until last 6 months or so. Updated colonoscopy earlier this year demonstrated no significant pathology. He desires additional hemorrhoid banding today. All risks, benefits, and alternative forms of therapy were described and informed consent was obtained.  In the left lateral decubitus position, a DRE revealed no abnormalities.  Anoscopy performed revealed notable left lateral and right mid anterior hemorrhoid columns.  The decision was made to band left lateral and right mid hemorrhoid column. One band each placed on each column.   Digital anorectal examination was then performed after each placement. Bands found to be in excellent position. No pinching or pain. The patient was discharged home without pain or other issues. Dietary and behavioral recommendations were given;  The patient will return in 8 weeks for followup and possible additional banding as required.  No complications were encountered and the patient tolerated the procedure well.

## 2017-11-28 ENCOUNTER — Encounter: Payer: Self-pay | Admitting: Internal Medicine

## 2017-12-21 ENCOUNTER — Other Ambulatory Visit: Payer: Self-pay

## 2017-12-21 DIAGNOSIS — K760 Fatty (change of) liver, not elsewhere classified: Secondary | ICD-10-CM

## 2018-01-08 LAB — HEPATIC FUNCTION PANEL
AG Ratio: 2.6 (calc) — ABNORMAL HIGH (ref 1.0–2.5)
ALKALINE PHOSPHATASE (APISO): 34 U/L — AB (ref 40–115)
ALT: 24 U/L (ref 9–46)
AST: 28 U/L (ref 10–40)
Albumin: 4.6 g/dL (ref 3.6–5.1)
Bilirubin, Direct: 0.2 mg/dL (ref 0.0–0.2)
Globulin: 1.8 g/dL (calc) — ABNORMAL LOW (ref 1.9–3.7)
Indirect Bilirubin: 0.5 mg/dL (calc) (ref 0.2–1.2)
TOTAL PROTEIN: 6.4 g/dL (ref 6.1–8.1)
Total Bilirubin: 0.7 mg/dL (ref 0.2–1.2)

## 2018-01-08 NOTE — Progress Notes (Signed)
Normal LFTs. Repeat in 1 year.

## 2018-01-09 ENCOUNTER — Other Ambulatory Visit: Payer: Self-pay

## 2018-01-09 DIAGNOSIS — K760 Fatty (change of) liver, not elsewhere classified: Secondary | ICD-10-CM

## 2018-01-29 ENCOUNTER — Encounter: Payer: Medicaid Other | Admitting: Internal Medicine

## 2018-02-18 ENCOUNTER — Telehealth: Payer: Self-pay | Admitting: Internal Medicine

## 2018-02-18 MED ORDER — HYDROCORTISONE 2.5 % RE CREA: 1.0000 "application " | TOPICAL_CREAM | RECTAL | 1 refills | Status: DC | PRN

## 2018-02-18 NOTE — Telephone Encounter (Signed)
Pt would like a refill of his hemorrhoid cream sent to his pharmacy.

## 2018-02-18 NOTE — Telephone Encounter (Signed)
Completed.

## 2018-02-18 NOTE — Telephone Encounter (Signed)
Pt is needing more hemorrhoid cream called into [place]  [phone]

## 2018-02-22 ENCOUNTER — Encounter: Payer: Self-pay | Admitting: Internal Medicine

## 2018-02-22 ENCOUNTER — Ambulatory Visit: Payer: Medicaid Other | Admitting: Internal Medicine

## 2018-02-22 VITALS — BP 138/96 | HR 78 | Temp 97.0°F | Ht 76.0 in | Wt 286.6 lb

## 2018-02-22 DIAGNOSIS — K641 Second degree hemorrhoids: Secondary | ICD-10-CM

## 2018-02-22 DIAGNOSIS — K648 Other hemorrhoids: Secondary | ICD-10-CM

## 2018-02-22 NOTE — Patient Instructions (Signed)
Avoid straining.  I recommend you take Benefiber every day.  Limit toilet time to 5 minutes  Call with any interim problems  Schedule followup appointment in 4 weeks from now

## 2018-02-22 NOTE — Progress Notes (Signed)
[facility] banding procedure note:    The patient presents with symptomatic grade 2 hemorrhoids -status post banding of the left lateral and right anterior hemorrhoid previously.  Still having significant hemorrhoid symptoms as outlined.  He request additional banding today.  Does not always take Benefiber on a r daily basis;   All risks, benefits, and alternative forms of therapy were described and informed consent was obtained.  In the left lateral decubitus position, a DRE revealed no abnormalities. .  The decision was made to band the left lateral, right anterior and posterior hemorrhoid columns.  1 band each placed on each column.  Follow-up DRE revealed bands to be in excellent position.  No pinching or pain. The patient was discharged home without pain or other issues. Dietary and behavioral recommendations were given.  Office visit here in 4 weeks.  No complications were encountered and the patient tolerated the procedure well.

## 2018-04-06 ENCOUNTER — Other Ambulatory Visit: Payer: Self-pay | Admitting: Gastroenterology

## 2018-05-07 ENCOUNTER — Encounter: Payer: Self-pay | Admitting: Internal Medicine

## 2018-05-07 ENCOUNTER — Ambulatory Visit (INDEPENDENT_AMBULATORY_CARE_PROVIDER_SITE_OTHER): Payer: Medicaid Other | Admitting: Internal Medicine

## 2018-05-07 ENCOUNTER — Telehealth: Payer: Self-pay | Admitting: Internal Medicine

## 2018-05-07 VITALS — BP 140/92 | HR 80 | Temp 97.4°F | Ht 76.0 in | Wt 294.0 lb

## 2018-05-07 DIAGNOSIS — K648 Other hemorrhoids: Secondary | ICD-10-CM | POA: Diagnosis not present

## 2018-05-07 DIAGNOSIS — B369 Superficial mycosis, unspecified: Secondary | ICD-10-CM

## 2018-05-07 NOTE — Telephone Encounter (Signed)
[facility] cream with lido is too expensive for pt. Is it ok for him to use the cr AB prescribed with the [facility] 2.5% cr and purchase otc lidocaine to mix? Please advise.

## 2018-05-07 NOTE — Progress Notes (Signed)
[facility] banding procedure note:  The patient presents with symptomatic grade 1/2  Hemorrhoids -status post banding of all 3 hemorrhoid columns.  Has had a waxing and waning improvement.  Predominantly complains of itching now.  No difficulties with hygiene.  2 BMs daily.  Denies straining.  Also says he has not been taking his fiber supplement regularly.  Would like some additional banding if it is feasible/appropriate. All risks, benefits, and alternative forms of therapy were described and informed consent was obtained.  In the left lateral decubitus position, inspection of the anus demonstrated some redness and induration suspicious for tinea infection.  Digital exam revealed no abnormalities and no unusual tenderness.  Scant brown stool in the rectal vault  The decision was made to place one more band in the neutral position.  The [facility] [place] System was used to perform band ligation without complication. Digital anorectal examination was then performed to assure proper positioning of the band and to adjust the banded tissue as required. The patient was discharged home without pain or other issues. Dietary and behavioral recommendations were given Likely has a tinea infection.  Will prescribe [facility] hemorrhoid cream (add Xylocaine) applied to the anorectum 4 times daily.  Clotrimazole nitrate cream OTC apply to the anorectum twice daily. No complications were encountered and the patient tolerated the procedure well.  First visit with [place] in 6 weeks.  He has likely derived all the benefits from hemorrhoid banding at this point in time.

## 2018-05-07 NOTE — Telephone Encounter (Signed)
Yes that will be okay

## 2018-05-07 NOTE — Telephone Encounter (Signed)
Noted. Pt notified of results.  

## 2018-05-07 NOTE — Telephone Encounter (Signed)
Correction, pt is aware that it's ok to use previous rectal cr/HC 2.5% with otc Lidocaine cr.  Refilled called in for pt with 1 rf.

## 2018-05-07 NOTE — Telephone Encounter (Signed)
Patient called and stated his cream that was called in is too expensive.  Can something else be tried

## 2018-05-07 NOTE — Patient Instructions (Addendum)
Avoid straining.  Benefiber 1 tablespoon twice daily   Limit toilet time to 5 minutes  Miconazole nitrate 2% cream apply pea-sized amount on the tip of the finger to the anorectum twice a day for 10 days -over-the-counter  Use [facility] hemorrhoid cream (add Xylocaine) pea-sized amount to the anorectum 4 times a day as needed for discomfort (dispense 30 g with 1 refill)  Call with any interim problems  Schedule followup appointment in 6 weeks from now

## 2018-06-17 ENCOUNTER — Encounter: Payer: Medicaid Other | Admitting: Gastroenterology

## 2018-06-17 NOTE — Progress Notes (Deleted)
Banding 5 years ago and most recently left lateral and right anterior in Aug 2019, then banding in Nov 2019 of left lateral, right anterior, and posterior. One more banding recently May 07, 2018. Prescribed [facility] cream with lidocaine, along with clotrimazole cream BID due to concern for tinea.

## 2018-06-19 ENCOUNTER — Ambulatory Visit (INDEPENDENT_AMBULATORY_CARE_PROVIDER_SITE_OTHER): Payer: Medicaid Other | Admitting: Gastroenterology

## 2018-06-19 ENCOUNTER — Encounter: Payer: Self-pay | Admitting: Gastroenterology

## 2018-06-19 ENCOUNTER — Telehealth: Payer: Self-pay | Admitting: Internal Medicine

## 2018-06-19 DIAGNOSIS — K6289 Other specified diseases of anus and rectum: Secondary | ICD-10-CM | POA: Diagnosis not present

## 2018-06-19 MED ORDER — NITROGLYCERIN 0.4 % RE OINT: 1.0000 [in_us] | TOPICAL_OINTMENT | Freq: Four times a day (QID) | RECTAL | 2 refills | Status: DC

## 2018-06-19 NOTE — Telephone Encounter (Signed)
.  [phone] please call patient about his cream medication, one was going to be called in for him and he has one at his house....wants to know if it is one that she was going to call in

## 2018-06-19 NOTE — Assessment & Plan Note (Addendum)
48 y.o. male presenting today with a history of symptomatic hemorrhoids, s/p banding in remote past and then most recently Aug 2019 of left lateral and right anterior. He return in Nov 2019 with banding again of left lateral, right anterior, and posterior due to persistsent symptoms. In Jan 2020, he presented again with mild symptoms and band placed in neutral position. Now noting symptoms consistent with likely occult anal fissure, although I was unable to appreciate this on exam. Anoscopy also performed, and although he has a more pronounced right posterior hemorrhoid column, he has no obvious enlarged internal hemorrhoids that would benefit from banding. Itching he reports is likely secondary to tinea, and he has not picked up clotrimazole OTC as previously recommended. He also has not been able to afford the [facility] cream, unfortunately.  I have sent in generic for rectiv ointment to [place] and spoke with Joselyn Glassman, the pharmacist. We will attempt to have this run through [facility] and may need prior authorization. Use 4 times a day, wearing gloves when applying and washing hands thereafter. Clotrimazole BID to anorectum, which is OTC.  Return in 2 months or sooner if needed He has likely maximized banding opportunities, and we will see how he is doing after treatment of anal fissure and tinea.

## 2018-06-19 NOTE — Progress Notes (Signed)
Referring Provider: Gwenyth Bender, MD Primary Care Physician:  Gwenyth Bender, MD Primary GI: Dr. Jena Gauss   Chief Complaint  Patient presents with  . Hemorrhoids    HPI:   Daniel Nicholson is a 48 y.o. male presenting today with a history of symptomatic hemorrhoids, s/p banding in remote past and then most recently Aug 2019 of left lateral and right anterior. He return in Nov 2019 with banding again of left lateral, right anterior, and posterior due to persistsent symptoms. In Jan 2020, he presented again with mild symptoms and band placed in neutral position. [facility] cream with xylocaine sent to [facility] and Clotrimazole OTC recommended to anorectum due to tinea. His colonoscopy is up-to-date as of April 2019 with internal hemorrhoids.   Notes itching intermittently. Was using some type of hemorrhoid cream. Possibly proctomed.  Sometimes some cutting pain in rectum during and after. Blood on tissue. No blood in stool. BM 2-3 in the morning. Doesn't feel constipated. Denies any straining. Feels like protrusion from rectum intermittently but resolves spontaneously.   Past Medical History:  Diagnosis Date  . Acid reflux   . Diabetes mellitus without complication (HCC)   . Gout   . Hemorrhoids    s/p right posterior, left lateral, right anterior banding. Last banding procedure Jan 2015.   [name] Hypertension   . Neck pain   . Poor circulation   . S/P tooth extraction 08/16/2016   three teeth extracted  . Tremors of nervous system     Past Surgical History:  Procedure Laterality Date  . COLONOSCOPY N/A 12/16/2012   Dr. Jena Gauss- internal hemorrhoids otherwise normal. Screening in 2024.   [name] COLONOSCOPY N/A 07/11/2017   Dr. Jena Gauss: Nonbleeding grade 1 internal hemorrhoids  . HEMORRHOID BANDING    . None      Current Outpatient Medications  Medication Sig Dispense Refill  . allopurinol (ZYLOPRIM) 300 MG tablet Take 300 mg by mouth every morning.     [name] atorvastatin  (LIPITOR) 20 MG tablet Take 20 mg by mouth daily.    . colchicine 0.6 MG tablet Take 0.6 mg by mouth every morning.     Tery Sanfilippo Calcium (STOOL SOFTENER PO) Take by mouth as needed.    . insulin detemir (LEVEMIR) 100 UNIT/ML injection Inject 49 Units into the skin at bedtime.     [name] linagliptin (TRADJENTA) 5 MG TABS tablet Take 5 mg by mouth daily.    [name] lisinopril-hydrochlorothiazide (PRINZIDE,ZESTORETIC) 10-12.5 MG per tablet Take 1 tablet by mouth every morning.     . loratadine (CLARITIN) 10 MG tablet Take 10 mg by mouth daily.    . Mesalamine (CANASA RE) Place 1 application rectally daily as needed.    . metFORMIN (GLUCOPHAGE) 500 MG tablet Take 2 tablets (1,000 mg total) by mouth 2 (two) times daily. Patient taking 2 tablets in the morning and 1 tablet in the evening. (Patient taking differently: Take 250 mg by mouth 2 (two) times daily. ) 60 tablet 0  . PROCTO-MED HC 2.5 % rectal cream APPLY RECTALLY TWICE DAILY AS DIRECTED. 30 g 0  . Wheat Dextrin (BENEFIBER PO) Take by mouth as needed.    [name] CANASA 1000 MG suppository INSERT 1 SUPPOSITORY RECTALLY AT BEDTIME FOR 30 DAYS. (Patient not taking: Reported on 11/27/2017) 30 suppository 0  . cyclobenzaprine (FLEXERIL) 5 MG tablet Take 1 tablet (5 mg total) by mouth at bedtime as needed for muscle spasms. (Patient not taking: Reported on 11/27/2017) 10  tablet 0  . gabapentin (NEURONTIN) 300 MG capsule Take 600 mg by mouth 2 (two) times daily.     [name] HYDROcodone-acetaminophen (NORCO/VICODIN) 5-325 MG tablet Take 1 tablet by mouth every 4 (four) hours as needed for moderate pain or severe pain. (Patient not taking: Reported on 11/27/2017) 15 tablet 0  . LORazepam (ATIVAN) 1 MG tablet Take 1 tablet (1 mg total) by mouth every 6 (six) hours as needed for anxiety. (Patient not taking: Reported on 05/07/2018) 10 tablet 0  . methocarbamol (ROBAXIN-750) 750 MG tablet Take 1 tablet (750 mg total) by mouth 4 (four) times daily. (Patient not taking: Reported on  11/27/2017) 21 tablet 0  . naproxen (NAPROSYN) 500 MG tablet Take 1 tablet (500 mg total) by mouth 2 (two) times daily. (Patient not taking: Reported on 02/22/2018) 14 tablet 0   No current facility-administered medications for this visit.     Allergies as of 06/19/2018  . (No Known Allergies)    Family History  Problem Relation Age of Onset  . Hypertension Mother   . Arthritis Unknown   . Colon cancer Neg Hx     Social History   Socioeconomic History  . Marital status: Divorced    Spouse name: Not on file  . Number of children: Not on file  . Years of education: 12th grade  . Highest education level: Not on file  Occupational History  . Occupation: unemployed  Social Needs  . Financial resource strain: Not on file  . Food insecurity:    Worry: Not on file    Inability: Not on file  . Transportation needs:    Medical: Not on file    Non-medical: Not on file  Tobacco Use  . Smoking status: Current Every Day Smoker    Packs/day: 0.50    Years: 30.00    Pack years: 15.00    Types: Cigarettes  . Smokeless tobacco: Never Used  . Tobacco comment: Smokes 6-7 cigarettes daily  Substance and Sexual Activity  . Alcohol use: Yes    Alcohol/week: 0.0 standard drinks    Comment: occ. beer but rare   . Drug use: No  . Sexual activity: Yes    Birth control/protection: Condom  Lifestyle  . Physical activity:    Days per week: Not on file    Minutes per session: Not on file  . Stress: Not on file  Relationships  . Social connections:    Talks on phone: Not on file    Gets together: Not on file    Attends religious service: Not on file    Active member of club or organization: Not on file    Attends meetings of clubs or organizations: Not on file    Relationship status: Not on file  Other Topics Concern  . Not on file  Social History Narrative  . Not on file    Review of Systems: Gen: Denies fever, chills, anorexia. Denies fatigue, weakness, weight loss.  CV:  Denies chest pain, palpitations, syncope, peripheral edema, and claudication. Resp: Denies dyspnea at rest, cough, wheezing, coughing up blood, and pleurisy. GI: Denies vomiting blood, jaundice, and fecal incontinence.   Denies dysphagia or odynophagia. Derm: Denies rash, itching, dry skin Psych: Denies depression, anxiety, memory loss, confusion. No homicidal or suicidal ideation.  Heme: Denies bruising, bleeding, and enlarged lymph nodes.  Physical Exam: BP (!) 140/92   Pulse 83   Temp (!) 97 F (36.1 C) (Oral)   Ht 6\' 4"  (1.93 m)  Wt 293 lb 3.2 oz (133 kg)   BMI 35.69 kg/m  General:   Alert and oriented. No distress noted. Pleasant and cooperative.  Head:  Normocephalic and atraumatic. Eyes:  Conjuctiva clear without scleral icterus. Mouth:  Oral mucosa pink and moist. Good dentition. No lesions. Abdomen:  +BS, soft, non-tender and non-distended. No rebound or guarding.  Rectal: mild erythema perianally. No obvious fissure. Slight discomfort with rectal exam. Anoscopy performed with possible pronounced right posterior column but otherwise no engorged internal hemorrhoids.  Msk:  Symmetrical without gross deformities. Normal posture. Extremities:  Without edema. Neurologic:  Alert and  oriented x4 Psych:  Alert and cooperative. Normal mood and affect.

## 2018-06-19 NOTE — Patient Instructions (Addendum)
I have called in nitroglycerin ointment to [facility]. They will work on getting this authorized for you, and we will work on this as well. Please bear with [place] till we can get it figured out.   In the meantime, pick up clotrimazole ointment from over-the-counter and use twice a day to area around your rectum. This is to help with itching. Keep it dry as you can, and do not use scented soaps.  I would like to see you back in 2 months. Please call with issues in the meantime!  It was a pleasure to see you today. I strive to create trusting relationships with patients to provide genuine, compassionate, and quality care. I value your feedback. If you receive a survey regarding your visit,  I greatly appreciate you taking time to fill this out.   Gelene Mink, PhD, ANP-BC [facility]    Anal Fissure, Adult  An anal fissure is a small tear or crack in the tissue around the opening of the butt (anus). Bleeding from the tear or crack usually stops on its own within a few minutes. The bleeding may happen every time you poop (have a bowel movement) until the tear or crack heals. What are the causes? This condition is usually caused by passing a large or hard poop (stool). Other causes include:  Trouble pooping (constipation).  Passing watery poop (diarrhea).  Inflammatory bowel disease (Crohn's disease or ulcerative colitis).  Childbirth.  Infections.  Anal sex. What are the signs or symptoms? Symptoms of this condition include:  Bleeding from the butt.  Small amounts of blood on your poop. The blood coats the outside of the poop. It is not mixed with the poop.  Small amounts of blood on the toilet paper or in the toilet after you poop.  Pain when passing poop.  Itching or irritation around the opening of the butt. How is this diagnosed? This condition may be diagnosed based on a physical exam. Your doctor may:  Check your butt. A tear can often be seen by  checking the area with care.  Check your butt using a short tube (anoscope). The light in the tube will show any problems in your butt. How is this treated? Treatment for this condition may include:  Treating problems that make it hard for you to pass poop. You may be told to: ? Eat more fiber. ? Drink more fluid. ? Take fiber supplements. ? Take medicines that make poop soft.  Taking sitz baths. This may help to heal the tear.  Using creams and ointments. If your condition gets worse, other treatments may be needed such as:  A shot near the tear or crack (botulinum injection).  Surgery to repair the tear or crack. Follow these instructions at home: Eating and drinking   Avoid bananas and dairy products. These foods can make it hard to poop.  Drink enough fluid to keep your pee (urine) pale yellow.  Eat foods that have a lot of fiber in them, such as: ? Beans. ? Whole grains. ? Fresh fruits. ? Fresh vegetables. General instructions   Take over-the-counter and prescription medicines only as told by your doctor.  Use creams or ointments only as told by your doctor.  Keep the butt area as clean and dry as you can.  Take a warm water bath (sitz bath) as told by your doctor. Do not use soap.  Keep all follow-up visits as told by your doctor. This is important. Contact a doctor  if:  You have more bleeding.  You have a fever.  You have watery poop that is mixed with blood.  You have pain.  Your problem gets worse, not better. Summary  An anal fissure is a small tear or crack in the skin around the opening of the butt (anus).  This condition is usually caused by passing a large or hard poop (stool).  Treatment includes treating the problems that make it hard for you to pass poop.  Follow your doctor's instructions about caring for your condition at home.  Keep all follow-up visits as told by your doctor. This is important. This information is not intended to  replace advice given to you by your health care provider. Make sure you discuss any questions you have with your health care provider. Document Released: 11/23/2010 Document Revised: 09/06/2017 Document Reviewed: 09/06/2017 Elsevier Interactive Patient Education  2019 [employer].

## 2018-06-19 NOTE — Telephone Encounter (Signed)
Spoke with pt, he is aware of what cr will be picked up from the pharmacy when it's ready.

## 2018-06-19 NOTE — Progress Notes (Signed)
CC'ED TO PCP 

## 2018-08-17 ENCOUNTER — Other Ambulatory Visit: Payer: Self-pay | Admitting: Gastroenterology

## 2018-08-28 ENCOUNTER — Other Ambulatory Visit: Payer: Self-pay

## 2018-08-28 ENCOUNTER — Ambulatory Visit: Payer: Medicaid Other | Admitting: Gastroenterology

## 2018-08-28 ENCOUNTER — Encounter: Payer: Self-pay | Admitting: Gastroenterology

## 2018-08-28 ENCOUNTER — Encounter: Payer: Self-pay | Admitting: Internal Medicine

## 2018-08-28 DIAGNOSIS — K649 Unspecified hemorrhoids: Secondary | ICD-10-CM

## 2018-08-28 DIAGNOSIS — K6289 Other specified diseases of anus and rectum: Secondary | ICD-10-CM

## 2018-08-28 NOTE — Patient Instructions (Addendum)
Continue Benefiber daily. Avoid straining. Limit toilet time to 2-3 minutes.   You may use hydrocortisone per rectum as needed for itching and burning. Do not use for more than about a week at a time, then give it a break. You may take this as needed.  Please see attached pictures of generic clotrimazole. Use this twice a day for the next week. After this, you could use as needed. (although it says for athlete's foot, this can be used for what you need as it is the anti-fungal cream.   We will see you in 6 months!  Call if no improvement or worsening in symptoms.  I enjoyed seeing you again today! As you know, I value our relationship and want to provide genuine, compassionate, and quality care. I welcome your feedback. If you receive a survey regarding your visit,  I greatly appreciate you taking time to fill this out. See you next time!  Gelene Mink, PhD, ANP-BC [facility]

## 2018-08-28 NOTE — Progress Notes (Signed)
Primary Care Physician:  Gwenyth Bender, MD  Primary GI: Dr. Jena Gauss  Virtual Visit via Telephone Note Due to COVID-19, visit is conducted virtually and was requested by patient.   I connected with Daniel Nicholson on 08/28/18 at  9:00 AM EDT by telephone and verified that I am speaking with the correct person using two identifiers.   I discussed the limitations, risks, security and privacy concerns of performing an evaluation and management service by telephone and the availability of in person appointments. I also discussed with the patient that there may be a patient responsible charge related to this service. The patient expressed understanding and agreed to proceed.  Chief Complaint  Patient presents with  . Hemorrhoids    Using cream, occas spotting, no rectal pain, not straining with BM  . Gastroesophageal Reflux    Doing okay     History of Present Illness: 48 y.o. male presenting today with a history of symptomatic hemorrhoids, s/p banding in remote past and then most recently Aug 2019 of left lateral and right anterior. He return in Nov 2019 with banding again of left lateral, right anterior, and posterior due to persistsent symptoms. In Jan 2020, he presented again with mild symptoms and band placed in neutral position. His colonoscopy is up-to-date as of April 2019 with internal hemorrhoids. 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 Unable to afford compounded [facility] cream. Generic Rectiv sent to [facility], along with starting clotrimazole OTC BID per rectum due to tinea.   Rectal pain resolved. Itching and burning occasionally if multiple bowel movements. Scant hematochezia with itching/burning. Took course of nitroglycerin. Has hydrocortisone but not taking. Hasn't been able to pick up clotrimazole OTC. Using Benefiber daily. No abdominal pain. No GERD. Overall much improved.     Past Medical History:  Diagnosis Date  . Acid reflux   .  Diabetes mellitus without complication (HCC)   . Gout   . Hemorrhoids    s/p right posterior, left lateral, right anterior banding. Last banding procedure Jan 2015.   [name] Hypertension   . Neck pain   . Poor circulation   . S/P tooth extraction 08/16/2016   three teeth extracted  . Tremors of nervous system      Past Surgical History:  Procedure Laterality Date  . COLONOSCOPY N/A 12/16/2012   Dr. Jena Gauss- internal hemorrhoids otherwise normal. Screening in 2024.   [name] COLONOSCOPY N/A 07/11/2017   Dr. Jena Gauss: Nonbleeding grade 1 internal hemorrhoids  . HEMORRHOID BANDING    . None       Current Meds  Medication Sig  . allopurinol (ZYLOPRIM) 300 MG tablet Take 300 mg by mouth every morning.   [name] atorvastatin (LIPITOR) 20 MG tablet Take 20 mg by mouth daily.  . colchicine 0.6 MG tablet Take 0.6 mg by mouth every morning.   Tery Sanfilippo Calcium (STOOL SOFTENER PO) Take by mouth as needed.  . insulin detemir (LEVEMIR) 100 UNIT/ML injection Inject 49 Units into the skin at bedtime.   [name] linagliptin (TRADJENTA) 5 MG TABS tablet Take 5 mg by mouth daily.  [name] lisinopril-hydrochlorothiazide (PRINZIDE,ZESTORETIC) 10-12.5 MG per tablet Take 1 tablet by mouth every morning.   . loratadine (CLARITIN) 10 MG tablet Take 10 mg by mouth daily.  . metFORMIN (GLUCOPHAGE) 500 MG tablet Take 2 tablets (1,000 mg total) by mouth 2 (two) times daily. Patient taking 2 tablets in the morning and 1 tablet in the evening. (Patient taking differently: Take  250 mg by mouth 2 (two) times daily. )  . PROCTO-MED HC 2.5 % rectal cream APPLY RECTALLY TWICE DAILY AS DIRECTED.  [name]. RECTIV 0.4 % OINT APPLY RECTALLY FOUR TIMES DAILY AS DIRECTED.  . Wheat Dextrin (BENEFIBER PO) Take by mouth as needed.     Family History  Problem Relation Age of Onset  . Hypertension Mother   . Arthritis Other   . Colon cancer Neg Hx     Social History   Socioeconomic History  . Marital status: Divorced    Spouse name: Not on file  . Number  of children: Not on file  . Years of education: 12th grade  . Highest education level: Not on file  Occupational History  . Occupation: unemployed  Social Needs  . Financial resource strain: Not on file  . Food insecurity:    Worry: Not on file    Inability: Not on file  . Transportation needs:    Medical: Not on file    Non-medical: Not on file  Tobacco Use  . Smoking status: Current Every Day Smoker    Packs/day: 0.50    Years: 30.00    Pack years: 15.00    Types: Cigarettes  . Smokeless tobacco: Never Used  . Tobacco comment: Smokes 6-7 cigarettes daily  Substance and Sexual Activity  . Alcohol use: Yes    Alcohol/week: 0.0 standard drinks    Comment: occ. beer but rare   . Drug use: No  . Sexual activity: Yes    Birth control/protection: Condom  Lifestyle  . Physical activity:    Days per week: Not on file    Minutes per session: Not on file  . Stress: Not on file  Relationships  . Social connections:    Talks on phone: Not on file    Gets together: Not on file    Attends religious service: Not on file    Active member of club or organization: Not on file    Attends meetings of clubs or organizations: Not on file    Relationship status: Not on file  Other Topics Concern  . Not on file  Social History Narrative  . Not on file       Review of Systems: See HPI   Observations/Objective: No distress. Sitting on cough with wife. Pleasant and cooperative.   Assessment and Plan: 48 year old male with history of multiple bandings for internal hemorrhoids, maximizing this therapy currently. Recently treated for occult anal fissure with generic Rectiv and resolution of symptoms. Still with occasional itching/burning if multiple BMs and likely multifactorial in setting of tinea and hemorrhoid exacerbation.   Continue with fiber. Avoid constipation. Hydrocortisone prn. Will provide generic options for clotrimazole, as I feel this would help his symptoms.   History  of fatty liver: LFTs normal Oct 2019. On recall for yearly LFTs, next in Oct 2020.   Return in 6 months.   Follow Up Instructions:    I discussed the assessment and treatment plan with the patient. The patient was provided an opportunity to ask questions and all were answered. The patient agreed with the plan and demonstrated an understanding of the instructions.   The patient was advised to call back or seek an in-person evaluation if the symptoms worsen or if the condition fails to improve as anticipated.  I provided 20 minutes of face-to-face time during this video encounter.  Gelene MinkAnna W. Jiovany Scheffel, PhD, ANP-BC [facility]

## 2018-08-29 NOTE — Progress Notes (Signed)
cc'ed to pcp °

## 2018-09-17 IMAGING — DX DG SHOULDER 2+V*R*
3 series · 3 of 3 positions shown · non-contrast
Comparison: 07/08/2012

CLINICAL DATA: Right shoulder pain for the past 2-3 weeks. No known
injury.

EXAM:
RIGHT SHOULDER - 2+ VIEW

[shoulder grashey]
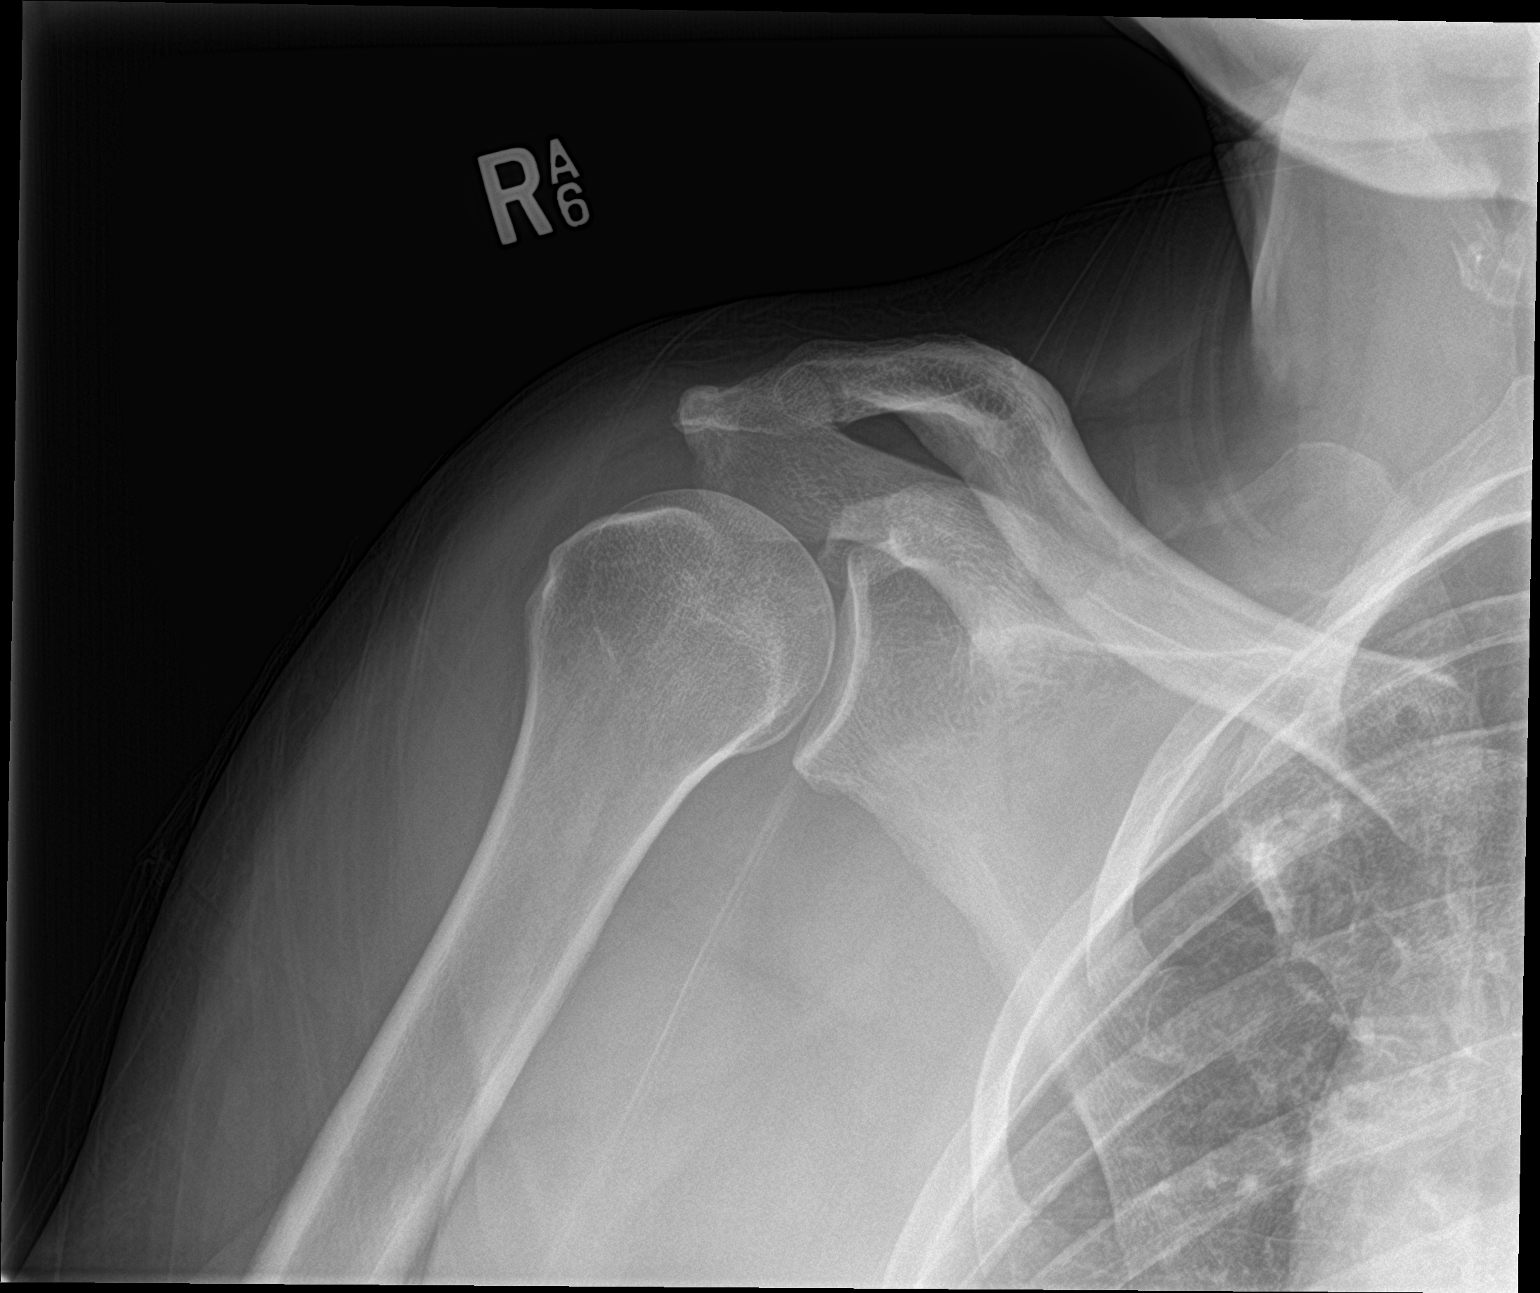

[shoulder y view]
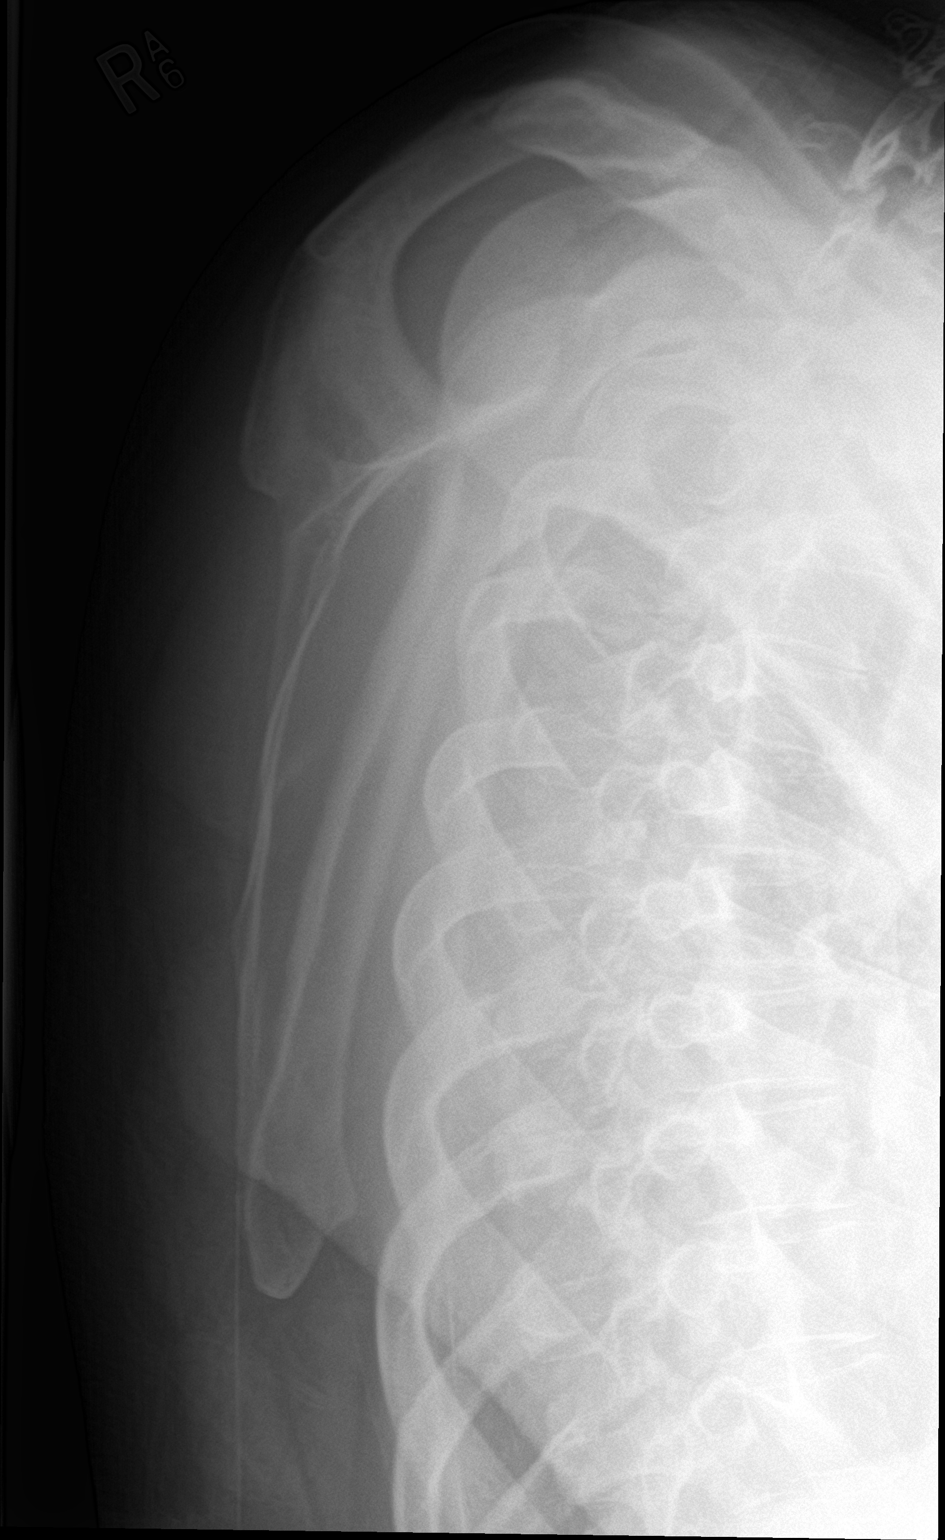

[shoulder axillary]
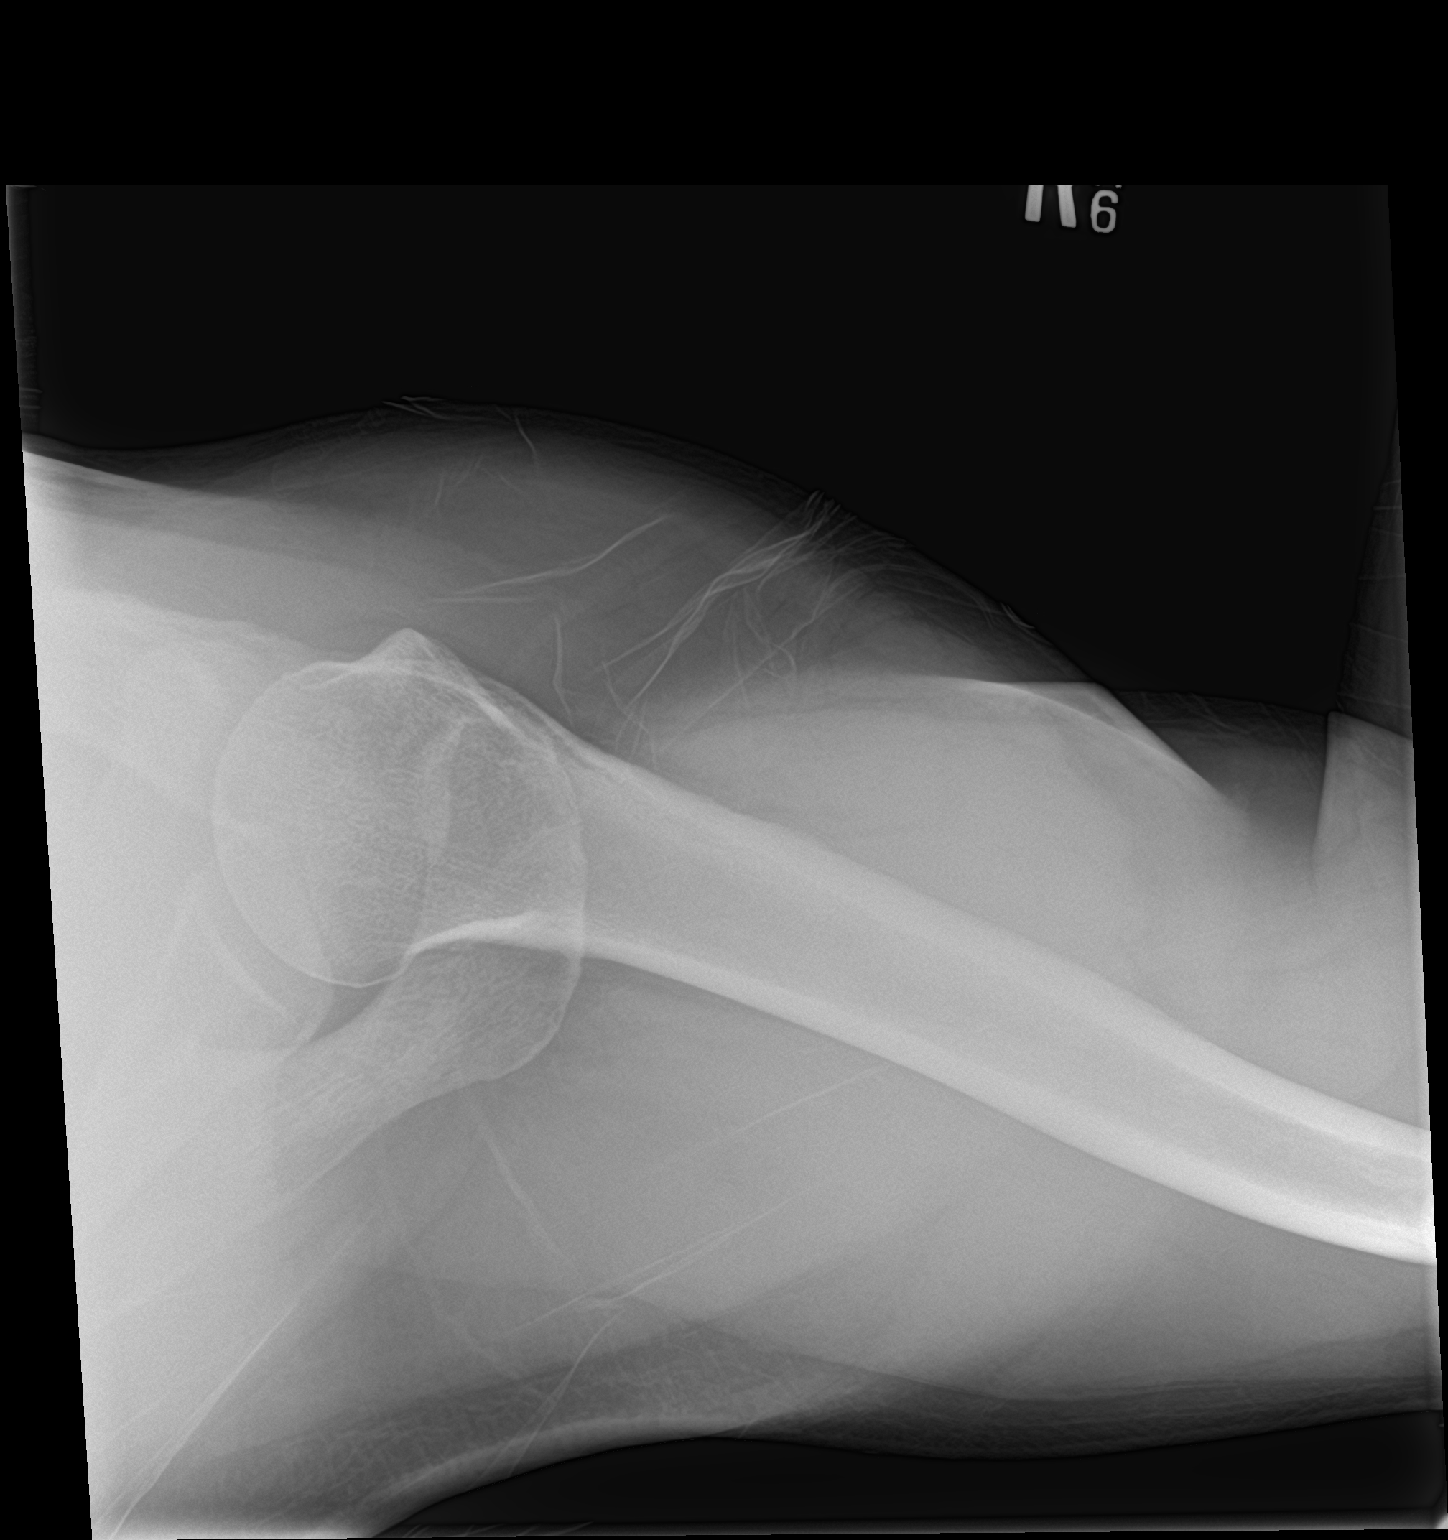

[3 of 3 positions shown; findings below may reference images not displayed]

FINDINGS: No fracture or dislocation. Glenohumeral and acromioclavicular joint
spaces appear preserved. No evidence of calcific tendinitis. Limited
visualization of the adjacent thorax is normal. Regional soft
tissues appear normal.
IMPRESSION: No explanation for patient's right shoulder pain.

## 2018-10-14 ENCOUNTER — Other Ambulatory Visit: Payer: Self-pay | Admitting: Internal Medicine

## 2018-10-14 ENCOUNTER — Telehealth: Payer: Self-pay | Admitting: *Deleted

## 2018-10-14 MED ORDER — HYDROCORTISONE (PERIANAL) 2.5 % EX CREA: 1.0000 "application " | TOPICAL_CREAM | Freq: Two times a day (BID) | CUTANEOUS | 1 refills | Status: DC

## 2018-10-14 NOTE — Telephone Encounter (Signed)
Request refill on procto cream sent to [facility]

## 2018-10-14 NOTE — Addendum Note (Signed)
Addended by: Mahala Menghini on: 10/14/2018 04:30 PM   Modules accepted: Orders

## 2018-11-01 ENCOUNTER — Other Ambulatory Visit: Payer: Self-pay

## 2018-11-01 DIAGNOSIS — Z20822 Contact with and (suspected) exposure to covid-19: Secondary | ICD-10-CM

## 2018-11-04 LAB — NOVEL CORONAVIRUS, NAA: SARS-CoV-2, NAA: NOT DETECTED

## 2018-11-08 ENCOUNTER — Telehealth: Payer: Self-pay | Admitting: Internal Medicine

## 2018-11-08 NOTE — Telephone Encounter (Signed)
Spoke with pt, he is having some itching, burning sensations near his rectum. Pt isn't having to strain when having a bowel movement. Pt was asked to use Clotrimazole 1% twice daily for 1 week at his last ov. Pt was advised to use the Clotrimazole 1% cr twice daily for 1-2 weeks to help with the itching/irriation that he had previously. Please advise for further recommendations.

## 2018-11-08 NOTE — Telephone Encounter (Signed)
Pt notified of AB's recommendations.  

## 2018-11-08 NOTE — Telephone Encounter (Signed)
Please call patient, was seen in May and left message that he is having problems with his "back end" and needed to know what he can do

## 2018-11-08 NOTE — Telephone Encounter (Signed)
Use the proctocream twice a day for 7 days. Continue the clotrimazole as needed (make sure has been taking this). If no improvement, needs office visit.

## 2018-11-14 ENCOUNTER — Other Ambulatory Visit: Payer: Self-pay | Admitting: Gastroenterology

## 2018-12-10 ENCOUNTER — Encounter: Payer: Self-pay | Admitting: Gastroenterology

## 2018-12-10 ENCOUNTER — Other Ambulatory Visit: Payer: Self-pay

## 2018-12-10 ENCOUNTER — Ambulatory Visit (INDEPENDENT_AMBULATORY_CARE_PROVIDER_SITE_OTHER): Payer: Medicaid Other | Admitting: Gastroenterology

## 2018-12-10 DIAGNOSIS — L290 Pruritus ani: Secondary | ICD-10-CM | POA: Diagnosis not present

## 2018-12-10 MED ORDER — KETOCONAZOLE 2 % EX CREA: 1.0000 "application " | TOPICAL_CREAM | Freq: Two times a day (BID) | CUTANEOUS | 1 refills | Status: DC

## 2018-12-10 NOTE — Progress Notes (Signed)
Referring Provider: Rogers Blocker, MD Primary Care Physician:  Rogers Blocker, MD  Primary GI: Dr. Gala Romney   Chief Complaint  Patient presents with  . Hemorrhoids    c/o burning, itching, bleeding. Past 2 nights the cream has not been helping     HPI:   Daniel Nicholson is a 48 y.o. male presenting today with a history of symptomatic hemorrhoids, s/p banding in remote past and then most recently Aug 2019 of left lateral and right anterior. He return in Nov 2019 with banding again of left lateral, right anterior, and posterior due to persistent symptoms. In Jan 2020, he presented again with mild symptoms and band placed in neutral position. His colonoscopy is up-to-date as of April 2019 with internal hemorrhoids. Due to concern for occult anal fissure, he had also been given generic rectiv from [employer], along with starting clotrimazole OTC BID per rectum due to tinea.   Feels like tissue may protrude out at times but not sure. Biggest concern is burning and itching. For last 2-3 nights, cream hasn't been helping. Mild discomfort with BMs. No significant improvement with [facility] cream. Has not used clotrimazole. No constipation. Occasional tissue hematochezia.   Past Medical History:  Diagnosis Date  . Acid reflux   . Diabetes mellitus without complication ([place])   . Gout   . Hemorrhoids    s/p right posterior, left lateral, right anterior banding. Last banding procedure Jan 2015.   [name] Hypertension   . Neck pain   . Poor circulation   . S/P tooth extraction 08/16/2016   three teeth extracted  . Tremors of nervous system     Past Surgical History:  Procedure Laterality Date  . COLONOSCOPY N/A 12/16/2012   Dr. Gala Romney- internal hemorrhoids otherwise normal. Screening in 2024.   [name] COLONOSCOPY N/A 07/11/2017   Dr. Gala Romney: Nonbleeding grade 1 internal hemorrhoids  . HEMORRHOID BANDING    . None      Current Outpatient Medications  Medication Sig Dispense Refill  .  allopurinol (ZYLOPRIM) 300 MG tablet Take 300 mg by mouth every morning.     [name] atorvastatin (LIPITOR) 20 MG tablet Take 20 mg by mouth daily.    . colchicine 0.6 MG tablet Take 0.6 mg by mouth every morning.     Mariane Baumgarten Calcium (STOOL SOFTENER PO) Take by mouth as needed.    . hydrocortisone (ANUSOL-HC) 2.5 % rectal cream APPLY RECTALLY TWICE DAILY. 30 g 2  . insulin detemir (LEVEMIR) 100 UNIT/ML injection Inject 49 Units into the skin at bedtime.     [name] linagliptin (TRADJENTA) 5 MG TABS tablet Take 5 mg by mouth daily.    [name] lisinopril-hydrochlorothiazide (PRINZIDE,ZESTORETIC) 10-12.5 MG per tablet Take 1 tablet by mouth every morning.     . loratadine (CLARITIN) 10 MG tablet Take 10 mg by mouth daily.    . metFORMIN (GLUCOPHAGE) 500 MG tablet Take 2 tablets (1,000 mg total) by mouth 2 (two) times daily. Patient taking 2 tablets in the morning and 1 tablet in the evening. (Patient taking differently: Take 250 mg by mouth 2 (two) times daily. ) 60 tablet 0  . RECTIV 0.4 % OINT APPLY RECTALLY FOUR TIMES DAILY AS DIRECTED. 30 g 0  . Wheat Dextrin (BENEFIBER PO) Take by mouth as needed.    [name] PROCTO-MED HC 2.5 % rectal cream APPLY RECTALLY TWICE DAILY AS DIRECTED. (Patient not taking: Reported on 12/10/2018) 30 g 0   No current facility-administered medications  for this visit.     Allergies as of 12/10/2018  . (No Known Allergies)    Family History  Problem Relation Age of Onset  . Hypertension Mother   . Arthritis Other   . Colon cancer Neg Hx     Social History   Socioeconomic History  . Marital status: Divorced    Spouse name: Not on file  . Number of children: Not on file  . Years of education: 12th grade  . Highest education level: Not on file  Occupational History  . Occupation: unemployed  Social Needs  . Financial resource strain: Not on file  . Food insecurity    Worry: Not on file    Inability: Not on file  . Transportation needs    Medical: Not on file     Non-medical: Not on file  Tobacco Use  . Smoking status: Current Every Day Smoker    Packs/day: 0.50    Years: 30.00    Pack years: 15.00    Types: Cigarettes  . Smokeless tobacco: Never Used  . Tobacco comment: Smokes 6-7 cigarettes daily  Substance and Sexual Activity  . Alcohol use: Yes    Alcohol/week: 0.0 standard drinks    Comment: occ. beer but rare   . Drug use: No  . Sexual activity: Yes    Birth control/protection: Condom  Lifestyle  . Physical activity    Days per week: Not on file    Minutes per session: Not on file  . Stress: Not on file  Relationships  . Social Musicianconnections    Talks on phone: Not on file    Gets together: Not on file    Attends religious service: Not on file    Active member of club or organization: Not on file    Attends meetings of clubs or organizations: Not on file    Relationship status: Not on file  Other Topics Concern  . Not on file  Social History Narrative  . Not on file    Review of Systems: Gen: Denies fever, chills, anorexia. Denies fatigue, weakness, weight loss.  CV: Denies chest pain, palpitations, syncope, peripheral edema, and claudication. Resp: Denies dyspnea at rest, cough, wheezing, coughing up blood, and pleurisy. GI: see HPI Derm: Denies rash, itching, dry skin Psych: Denies depression, anxiety, memory loss, confusion. No homicidal or suicidal ideation.  Heme: Denies bruising, bleeding, and enlarged lymph nodes.  Physical Exam: BP (!) 144/93   Pulse 87   Temp (!) 97.1 F (36.2 C) (Oral)   Ht 6\' 4"  (1.93 m)   Wt 298 lb 6.4 oz (135.4 kg)   BMI 36.32 kg/m  General:   Alert and oriented. No distress noted. Pleasant and cooperative.  Head:  Normocephalic and atraumatic. Abdomen:  +BS, soft, non-tender and non-distended. No rebound or guarding. Rectal: irritation, redness around anus, no obvious prolapsing hemorrhoids, no obvious fissure, DRE without mass.  Msk:  Symmetrical without gross deformities. Normal  posture. Extremities:  Without edema. Neurologic:  Alert and  oriented x4 Psych:  Alert and cooperative. Normal mood and affect.

## 2018-12-10 NOTE — Patient Instructions (Addendum)
I have sent in a prescription anti-fungal cream to use twice a day. Please call if no improvement. Avoid any steroid creams right now, as it may make it worse.  We will see you in 2-3 months!  Again, please call with updates!  I enjoyed seeing you again today! As you know, I value our relationship and want to provide genuine, compassionate, and quality care. I welcome your feedback. If you receive a survey regarding your visit,  I greatly appreciate you taking time to fill this out. See you next time!  Annitta Needs, PhD, ANP-BC [facility]

## 2018-12-11 NOTE — Assessment & Plan Note (Signed)
48 year old male with history of symptomatic hemorrhoid s/p multiple banding sessions and now with persistent burning and itching peri-rectally, previously felt to have yeast but has yet to treat with supportive measures as previously recommended. He is declining using OTC agents. I have sent in ketoconazole to use per rectum. Avoid hydrocortisone for now. If further concern for hemorrhoids, would recommend surgical referral, as he has maximized benefit here of outpatient banding. Symptoms currently seem to be more related to candidiasis than hemorrhoids. Return in 2-3 months and call if no improvement.

## 2018-12-31 ENCOUNTER — Other Ambulatory Visit: Payer: Self-pay

## 2018-12-31 DIAGNOSIS — K760 Fatty (change of) liver, not elsewhere classified: Secondary | ICD-10-CM

## 2019-01-08 ENCOUNTER — Telehealth: Payer: Self-pay | Admitting: Internal Medicine

## 2019-01-08 NOTE — Telephone Encounter (Signed)
Pt said the hemorrhoid cream isn't helping much and he is still having problems. Is there anything else to try or does he need to come back to the office sooner than NOV. Please advise. [phone]

## 2019-01-08 NOTE — Telephone Encounter (Signed)
Spoke with pt. Pt is using Ketoconazole cr bid as directed and states he is still having itching and mild pain. Pt isn't straining when having a bowel movement. Per pt , he is only using the Ketoconazole and not the HC cream.

## 2019-01-09 ENCOUNTER — Telehealth: Payer: Self-pay

## 2019-01-09 NOTE — Telephone Encounter (Signed)
Pt was due labs on 01/10/2019. I had mailed the orders and the letter was return with sticker that said unable to forward. I have called pt and left Vm for a return call.  His labs are for [facility].

## 2019-01-09 NOTE — Telephone Encounter (Signed)
Pt isn't having any rectal bleeding that he can see, no prolapsing tissue, no knife like pain. Pt is having more itching, burning and sometimes feels a sharp pain. The pain isn't constant.

## 2019-01-09 NOTE — Telephone Encounter (Signed)
Does he have any rectal bleeding? Any sensation of prolapsing tissue? Knife-like pain with BM?

## 2019-01-10 NOTE — Telephone Encounter (Signed)
Pt has called back this morning. He told me the same that he told [place] yesterday. He is aware that yesterday was Anna's short day and I will find out what she wants to do and let him know.

## 2019-01-10 NOTE — Telephone Encounter (Signed)
Let's make an office visit. May resume Anusol BID as needed. May need to pursue flex sig and can discuss this at office visit.

## 2019-01-10 NOTE — Telephone Encounter (Signed)
I called pt and he is aware to go to the lab in the next few days for his lab work. He has moved and has a new address and I have updated that in the computer, [employer].

## 2019-01-10 NOTE — Telephone Encounter (Signed)
Pt is aware and has appt on 01/17/2019 at 9:30 am with Roseanne Kaufman, NP.

## 2019-01-11 LAB — HEPATIC FUNCTION PANEL
AG Ratio: 2 (calc) (ref 1.0–2.5)
ALT: 35 U/L (ref 9–46)
AST: 24 U/L (ref 10–40)
Albumin: 4.5 g/dL (ref 3.6–5.1)
Alkaline phosphatase (APISO): 43 U/L (ref 36–130)
Bilirubin, Direct: 0.2 mg/dL (ref 0.0–0.2)
Globulin: 2.2 g/dL (calc) (ref 1.9–3.7)
Indirect Bilirubin: 0.6 mg/dL (calc) (ref 0.2–1.2)
Total Bilirubin: 0.8 mg/dL (ref 0.2–1.2)
Total Protein: 6.7 g/dL (ref 6.1–8.1)

## 2019-01-13 NOTE — Progress Notes (Signed)
HFP normal. Repeat yearly.

## 2019-01-14 ENCOUNTER — Ambulatory Visit (INDEPENDENT_AMBULATORY_CARE_PROVIDER_SITE_OTHER): Payer: Medicaid Other | Admitting: Urology

## 2019-01-14 DIAGNOSIS — N5201 Erectile dysfunction due to arterial insufficiency: Secondary | ICD-10-CM

## 2019-01-16 ENCOUNTER — Encounter: Payer: Self-pay | Admitting: Gastroenterology

## 2019-01-16 ENCOUNTER — Encounter: Payer: Self-pay | Admitting: *Deleted

## 2019-01-16 ENCOUNTER — Other Ambulatory Visit: Payer: Self-pay

## 2019-01-16 ENCOUNTER — Ambulatory Visit (INDEPENDENT_AMBULATORY_CARE_PROVIDER_SITE_OTHER): Payer: Medicaid Other | Admitting: Gastroenterology

## 2019-01-16 VITALS — BP 134/93 | HR 69 | Temp 96.6°F | Ht 76.0 in | Wt 297.2 lb

## 2019-01-16 DIAGNOSIS — K625 Hemorrhage of anus and rectum: Secondary | ICD-10-CM

## 2019-01-16 DIAGNOSIS — K6289 Other specified diseases of anus and rectum: Secondary | ICD-10-CM

## 2019-01-16 NOTE — Patient Instructions (Signed)
We are arranging a flexible sigmoidoscopy with Dr. Gala Romney for further evaluation of the rectum and sigmoid.  For now, avoid straining, continue high fiber diet, and use the anusol cream per rectum as needed. Try to limit unless absolutely needed.  Further recommendations to follow!  I enjoyed seeing you again today! As you know, I value our relationship and want to provide genuine, compassionate, and quality care. I welcome your feedback. If you receive a survey regarding your visit,  I greatly appreciate you taking time to fill this out. See you next time!  Annitta Needs, PhD, ANP-BC [facility]

## 2019-01-16 NOTE — Progress Notes (Signed)
Referring Provider: Rogers Blocker, MD Primary Care Physician:  Rogers Blocker, MD Primary GI: Dr. Gala Romney   Chief Complaint  Patient presents with  . Anal Itching    burning    HPI:   Daniel Nicholson is a 48 y.o. male presenting today with a history of symptomatic hemorrhoids, s/p banding in remote past and then most recently Aug 2019 of left lateral and right anterior. He return in Nov 2019 with banding again of left lateral, right anterior, and posterior due to persistent symptoms. In Jan 2020, he presented again with mild symptoms and band placed in neutral position.His colonoscopy is up-to-date as of April 2019 with internal hemorrhoids. Due to concern for occult anal fissure, he had also been given generic rectiv from [employer], along with starting clotrimazole OTC BID per rectum due to tinea.   Last seen in Sept 2020. We have had difficulty with compliance regarding creams, specifically clotrimazole OTC. I sent in a prescription anti-fungal a month ago to trial. Discussed possible referral for surgical evaluation due to persistent hemorrhoids as he has maximized benefit of outpatient banding.   Used ketoconazole with some improvement. Will have days without itching/burning, but this is with just one BM. Rectal burning after having a second BM. +itching. If doesn't use the cream, will be significant itching/burning. Tiny spot of blood with wiping. Sometimes feels like tissue is sticking out. No rectal pain.   With sweating, will precipitate symptoms.   Past Medical History:  Diagnosis Date  . Acid reflux   . Diabetes mellitus without complication ([place])   . Gout   . Hemorrhoids    s/p right posterior, left lateral, right anterior banding. Last banding procedure Jan 2015.   [name] Hypertension   . Neck pain   . Poor circulation   . S/P tooth extraction 08/16/2016   three teeth extracted  . Tremors of nervous system     Past Surgical History:  Procedure Laterality Date   . COLONOSCOPY N/A 12/16/2012   Dr. Gala Romney- internal hemorrhoids otherwise normal. Screening in 2024.   [name] COLONOSCOPY N/A 07/11/2017   Dr. Gala Romney: Nonbleeding grade 1 internal hemorrhoids  . HEMORRHOID BANDING    . None      Current Outpatient Medications  Medication Sig Dispense Refill  . acetaminophen (TYLENOL) 500 MG tablet Take 500 mg by mouth every 8 (eight) hours as needed.    [name] allopurinol (ZYLOPRIM) 300 MG tablet Take 300 mg by mouth every morning.     [name] atorvastatin (LIPITOR) 20 MG tablet Take 20 mg by mouth daily.    . colchicine 0.6 MG tablet Take 0.6 mg by mouth every morning.     Mariane Baumgarten Calcium (STOOL SOFTENER PO) Take by mouth as needed.    . insulin detemir (LEVEMIR) 100 UNIT/ML injection Inject 49 Units into the skin at bedtime.     [name] ketoconazole (NIZORAL) 2 % cream Apply 1 application topically 2 (two) times daily. Per rectum 30 g 1  . linagliptin (TRADJENTA) 5 MG TABS tablet Take 5 mg by mouth daily.    [name] lisinopril-hydrochlorothiazide (PRINZIDE,ZESTORETIC) 10-12.5 MG per tablet Take 1 tablet by mouth every morning.     . loratadine (CLARITIN) 10 MG tablet Take 10 mg by mouth daily.    . metFORMIN (GLUCOPHAGE) 500 MG tablet Take 2 tablets (1,000 mg total) by mouth 2 (two) times daily. Patient taking 2 tablets in the morning and 1 tablet in the evening. (Patient taking differently: Take  250 mg by mouth 2 (two) times daily. ) 60 tablet 0  . Wheat Dextrin (BENEFIBER PO) Take by mouth as needed.    . hydrocortisone (ANUSOL-HC) 2.5 % rectal cream APPLY RECTALLY TWICE DAILY. (Patient not taking: Reported on 01/16/2019) 30 g 2   No current facility-administered medications for this visit.     Allergies as of 01/16/2019  . (No Known Allergies)    Family History  Problem Relation Age of Onset  . Hypertension Mother   . Arthritis Other   . Colon cancer Neg Hx     Social History   Socioeconomic History  . Marital status: Divorced    Spouse name: Not on file  . Number  of children: Not on file  . Years of education: 12th grade  . Highest education level: Not on file  Occupational History  . Occupation: unemployed  Social Needs  . Financial resource strain: Not on file  . Food insecurity    Worry: Not on file    Inability: Not on file  . Transportation needs    Medical: Not on file    Non-medical: Not on file  Tobacco Use  . Smoking status: Current Every Day Smoker    Packs/day: 0.50    Years: 30.00    Pack years: 15.00    Types: Cigarettes  . Smokeless tobacco: Never Used  . Tobacco comment: Smokes 6-7 cigarettes daily  Substance and Sexual Activity  . Alcohol use: Yes    Alcohol/week: 0.0 standard drinks    Comment: occ. beer but rare   . Drug use: No  . Sexual activity: Yes    Birth control/protection: Condom  Lifestyle  . Physical activity    Days per week: Not on file    Minutes per session: Not on file  . Stress: Not on file  Relationships  . Social Musician on phone: Not on file    Gets together: Not on file    Attends religious service: Not on file    Active member of club or organization: Not on file    Attends meetings of clubs or organizations: Not on file    Relationship status: Not on file  Other Topics Concern  . Not on file  Social History Narrative  . Not on file    Review of Systems: Gen: Denies fever, chills, anorexia. Denies fatigue, weakness, weight loss.  CV: Denies chest pain, palpitations, syncope, peripheral edema, and claudication. Resp: Denies dyspnea at rest, cough, wheezing, coughing up blood, and pleurisy. GI: see HPI Derm: Denies rash, itching, dry skin Psych: Denies depression, anxiety, memory loss, confusion. No homicidal or suicidal ideation.  Heme: Denies bruising, bleeding, and enlarged lymph nodes.  Physical Exam: BP (!) 134/93   Pulse 69   Temp (!) 96.6 F (35.9 C) (Temporal)   Ht 6\' 4"  (1.93 m)   Wt 297 lb 3.2 oz (134.8 kg)   BMI 36.18 kg/m  General:   Alert and  oriented. No distress noted. Pleasant and cooperative.  Head:  Normocephalic and atraumatic. Eyes:  Conjuctiva clear without scleral icterus. Lungs: clear bilaterally Cardiac: S1 S2 present, no murmurs Abdomen:  +BS, soft, non-tender and non-distended. No rebound or guarding. No HSM or masses noted. Rectal: no obvious rash externally or fissure. Anoscope with ?internal hemorrhoids. When removing anoscope just before the anal opening, left lateral position: small possible scattered ulcerations Msk:  Symmetrical without gross deformities. Normal posture. Extremities:  Without edema. Neurologic:  Alert and  oriented x4 Psych:  Alert and cooperative. Normal mood and affect.

## 2019-01-23 NOTE — Assessment & Plan Note (Signed)
48 year old male with prior colonoscopy in April 2019 with internal hemorrhoids, undergoing multiple banding sessions (Aug 2019, Nov 2019 of all three columns, Jan 2020 with band in neutral position), and also concern for occult anal fissure and possible perianal tinea infection. Difficulty earlier this year with compliance regarding creams, including clotrimazole. On rectal exam, externally without rash, erythema, or abnormalities. Internal exam with possible hemorrhoids but also noting in left lateral position just before reaching anal verge, noting small scattered possible ulcerations. Persistent low-volume hematochezia, itching, burning, if more than 1 BM per day. Likely dealing with hemorrhoid etiology and will need surgical referral likely, as we have maximized outpatient banding. However, prior to referring, as last evaluation approximately 18 months ago, recommend flex sig to ensure no other occult process. With anoscopy, unclear if appreciating ulcerations just inside anal verge or other etiology. Will pursue flex sig for better visualization and likely refer to Gen Surg thereafter.   Proceed with flex sig with Dr. Gala Romney in near future: the risks, benefits, and alternatives have been discussed with the patient in detail. The patient states understanding and desires to proceed. High fiber diet Anusol cream

## 2019-03-04 ENCOUNTER — Ambulatory Visit: Payer: Medicaid Other | Admitting: Gastroenterology

## 2019-03-04 ENCOUNTER — Telehealth: Payer: Self-pay | Admitting: Gastroenterology

## 2019-03-04 MED ORDER — HYDROCORTISONE (PERIANAL) 2.5 % EX CREA: TOPICAL_CREAM | CUTANEOUS | 2 refills | Status: DC

## 2019-03-04 NOTE — Telephone Encounter (Signed)
Patient has appt today at 11am. This is an old appt, and he has been seen since then. Flex sig upcoming in Dec 2020. I have contacted him and informed no need for this appt today. Keep plans for flex sig in Dec 2020. I have sent in refills for anusol.

## 2019-03-24 ENCOUNTER — Other Ambulatory Visit: Payer: Self-pay

## 2019-03-24 ENCOUNTER — Other Ambulatory Visit (HOSPITAL_COMMUNITY)
Admission: RE | Admit: 2019-03-24 | Discharge: 2019-03-24 | Disposition: A | Discharge status: Home / Self Care | Payer: Medicaid Other | Source: Ambulatory Visit | Attending: Internal Medicine | Admitting: Internal Medicine

## 2019-03-24 DIAGNOSIS — Z20828 Contact with and (suspected) exposure to other viral communicable diseases: Secondary | ICD-10-CM | POA: Diagnosis not present

## 2019-03-24 DIAGNOSIS — Z01812 Encounter for preprocedural laboratory examination: Secondary | ICD-10-CM | POA: Diagnosis present

## 2019-03-24 LAB — SARS CORONAVIRUS 2 (TAT 6-24 HRS): SARS Coronavirus 2: NEGATIVE

## 2019-03-25 ENCOUNTER — Telehealth: Payer: Self-pay | Admitting: Internal Medicine

## 2019-03-25 NOTE — Telephone Encounter (Signed)
Patient called wanting to know at what time tomorrow morning he needed to use the 1st enema. I advised patient 90 minutes before he leaves to go to the hospital and 2nd one is 60 minutes before he leaves. Nothing further needed

## 2019-03-25 NOTE — Telephone Encounter (Signed)
Pt has questions about his prep instructions. Please call him at [phone]

## 2019-03-26 ENCOUNTER — Other Ambulatory Visit: Payer: Self-pay

## 2019-03-26 ENCOUNTER — Encounter (HOSPITAL_COMMUNITY)
Admission: RE | Disposition: A | Discharge status: Home / Self Care | Payer: Self-pay | Source: Home / Self Care | Attending: Internal Medicine

## 2019-03-26 ENCOUNTER — Other Ambulatory Visit: Payer: Self-pay | Admitting: *Deleted

## 2019-03-26 ENCOUNTER — Ambulatory Visit (HOSPITAL_COMMUNITY)
Admission: RE | Admit: 2019-03-26 | Discharge: 2019-03-26 | Disposition: A | Discharge status: Home / Self Care | Payer: Medicaid Other | Attending: Internal Medicine | Admitting: Internal Medicine

## 2019-03-26 ENCOUNTER — Encounter (HOSPITAL_COMMUNITY): Payer: Self-pay | Admitting: Internal Medicine

## 2019-03-26 ENCOUNTER — Telehealth: Payer: Self-pay | Admitting: *Deleted

## 2019-03-26 DIAGNOSIS — Z8249 Family history of ischemic heart disease and other diseases of the circulatory system: Secondary | ICD-10-CM | POA: Diagnosis not present

## 2019-03-26 DIAGNOSIS — K219 Gastro-esophageal reflux disease without esophagitis: Secondary | ICD-10-CM | POA: Diagnosis not present

## 2019-03-26 DIAGNOSIS — E119 Type 2 diabetes mellitus without complications: Secondary | ICD-10-CM | POA: Diagnosis not present

## 2019-03-26 DIAGNOSIS — K649 Unspecified hemorrhoids: Secondary | ICD-10-CM | POA: Diagnosis not present

## 2019-03-26 DIAGNOSIS — F17210 Nicotine dependence, cigarettes, uncomplicated: Secondary | ICD-10-CM | POA: Insufficient documentation

## 2019-03-26 DIAGNOSIS — M109 Gout, unspecified: Secondary | ICD-10-CM | POA: Diagnosis not present

## 2019-03-26 DIAGNOSIS — K6289 Other specified diseases of anus and rectum: Secondary | ICD-10-CM

## 2019-03-26 DIAGNOSIS — K921 Melena: Secondary | ICD-10-CM | POA: Diagnosis present

## 2019-03-26 DIAGNOSIS — Z794 Long term (current) use of insulin: Secondary | ICD-10-CM | POA: Diagnosis not present

## 2019-03-26 DIAGNOSIS — I10 Essential (primary) hypertension: Secondary | ICD-10-CM | POA: Diagnosis not present

## 2019-03-26 DIAGNOSIS — Z560 Unemployment, unspecified: Secondary | ICD-10-CM | POA: Diagnosis not present

## 2019-03-26 DIAGNOSIS — K602 Anal fissure, unspecified: Secondary | ICD-10-CM

## 2019-03-26 DIAGNOSIS — K625 Hemorrhage of anus and rectum: Secondary | ICD-10-CM

## 2019-03-26 DIAGNOSIS — Z79899 Other long term (current) drug therapy: Secondary | ICD-10-CM | POA: Diagnosis not present

## 2019-03-26 HISTORY — PX: FLEXIBLE SIGMOIDOSCOPY: SHX5431

## 2019-03-26 LAB — GLUCOSE, CAPILLARY: Glucose-Capillary: 117 mg/dL — ABNORMAL HIGH (ref 70–99)

## 2019-03-26 SURGERY — SIGMOIDOSCOPY, FLEXIBLE
Anesthesia: Moderate Sedation

## 2019-03-26 MED ORDER — MIDAZOLAM HCL 5 MG/5ML IJ SOLN
INTRAMUSCULAR | Status: DC | PRN
  Administered 2019-03-26: 2 mg via INTRAVENOUS
  Administered 2019-03-26 (×2): 1 mg via INTRAVENOUS
  Administered 2019-03-26: 2 mg via INTRAVENOUS

## 2019-03-26 MED ORDER — STERILE WATER FOR IRRIGATION IR SOLN
Status: DC | PRN
  Administered 2019-03-26: 2.5 mL

## 2019-03-26 MED ORDER — MIDAZOLAM HCL 5 MG/5ML IJ SOLN
INTRAMUSCULAR | Status: AC
  Filled 2019-03-26: qty 10

## 2019-03-26 MED ORDER — SODIUM CHLORIDE 0.9 % IV SOLN: INTRAVENOUS | Status: DC

## 2019-03-26 MED ORDER — MEPERIDINE HCL 50 MG/ML IJ SOLN
INTRAMUSCULAR | Status: AC
  Filled 2019-03-26: qty 1

## 2019-03-26 MED ORDER — ONDANSETRON HCL 4 MG/2ML IJ SOLN
INTRAMUSCULAR | Status: DC | PRN
  Administered 2019-03-26: 4 mg via INTRAVENOUS

## 2019-03-26 MED ORDER — ONDANSETRON HCL 4 MG/2ML IJ SOLN
INTRAMUSCULAR | Status: AC
  Filled 2019-03-26: qty 2

## 2019-03-26 MED ORDER — MEPERIDINE HCL 100 MG/ML IJ SOLN
INTRAMUSCULAR | Status: DC | PRN
  Administered 2019-03-26: 25 mg via INTRAVENOUS
  Administered 2019-03-26: 15 mg via INTRAVENOUS

## 2019-03-26 NOTE — Discharge Instructions (Signed)
  Sigmoidoscopy Discharge Instructions  Read the instructions outlined below and refer to this sheet in the next few weeks. These discharge instructions provide you with general information on caring for yourself after you leave the hospital. Your doctor may also give you specific instructions. While your treatment has been planned according to the most current medical practices available, unavoidable complications occasionally occur. If you have any problems or questions after discharge, call Dr. Gala Romney at [phone]. ACTIVITY  You may resume your regular activity, but move at a slower pace for the next 24 hours.   Take frequent rest periods for the next 24 hours.   Walking will help get rid of the air and reduce the bloated feeling in your belly (abdomen).   No driving for 24 hours (because of the medicine (anesthesia) used during the test).    Do not sign any important legal documents or operate any machinery for 24 hours (because of the anesthesia used during the test).  NUTRITION  Drink plenty of fluids.   You may resume your normal diet as instructed by your doctor.   Begin with a light meal and progress to your normal diet. Heavy or fried foods are harder to digest and may make you feel sick to your stomach (nauseated).   Avoid alcoholic beverages for 24 hours or as instructed.  MEDICATIONS  You may resume your normal medications unless your doctor tells you otherwise.  WHAT YOU CAN EXPECT TODAY  Some feelings of bloating in the abdomen.   Passage of more gas than usual.   Spotting of blood in your stool or on the toilet paper.  IF YOU HAD POLYPS REMOVED DURING THE COLONOSCOPY:  No aspirin products for 7 days or as instructed.   No alcohol for 7 days or as instructed.   Eat a soft diet for the next 24 hours.  FINDING OUT THE RESULTS OF YOUR TEST Not all test results are available during your visit. If your test results are not back during the visit, make an appointment  with your caregiver to find out the results. Do not assume everything is normal if you have not heard from your caregiver or the medical facility. It is important for you to follow up on all of your test results.  SEEK IMMEDIATE MEDICAL ATTENTION IF:  You have more than a spotting of blood in your stool.   Your belly is swollen (abdominal distention).   You are nauseated or vomiting.   You have a temperature over 101.   You have abdominal pain or discomfort that is severe or gets worse throughout the day.   Your rectum look good today.  Did not see any major hemorrhoid areas.  You may have something called a fissure which is causing problems.  Would be best to see the general surgeon in the near future to evaluate for this entity further.  A local surgical procedure may be needed.  I am going to make an appointment to see Dr. Aviva Signs, a general surgeon,  Locally.  At patient request, I called Angela Adam at [phone] and reviewed results.

## 2019-03-26 NOTE — Telephone Encounter (Signed)
Received call from endo that patient needs referral to Dr. Arnoldo Morale for anal fissure. I have sent referral.

## 2019-03-26 NOTE — Op Note (Signed)
[facility] Patient Name: Daniel Nicholson Procedure Date: 03/26/2019 7:17 AM MRN: [id number] Date of Birth: January 22, 1971 Attending MD: Gennette Pac , MD CSN: [id number] Age: 48 Admit Type: Outpatient Procedure:                Flexible Sigmoidoscopy Indications:              Hematochezia Providers:                Gennette Pac, MD, Nena Polio, RN, Burke Keels, Technician Referring MD:              Medicines:                None, Midazolam mg 6 IV, Meperidine mg 40 mgIV Complications:            No immediate complications. Estimated Blood Loss:     Estimated blood loss: none. Procedure:                Pre-Anesthesia Assessment:                           - Prior to the procedure, a History and Physical                            was performed, and patient medications and                            allergies were reviewed. The patient's tolerance of                            previous anesthesia was also reviewed. The risks                            and benefits of the procedure and the sedation                            options and risks were discussed with the patient.                            All questions were answered, and informed consent                            was obtained. Prior Anticoagulants: The patient has                            taken no previous anticoagulant or antiplatelet                            agents. ASA Grade Assessment: II - A patient with                            mild systemic disease. After reviewing the risks  and benefits, the patient was deemed in                            satisfactory condition to undergo the procedure.                           After obtaining informed consent, the scope was                            passed under direct vision. The CF-[id number] ([id number])                            scope was introduced through the anus and advanced       to the the sigmoid colon. The flexible                            sigmoidoscopy was accomplished without difficulty.                            The patient tolerated the procedure well. The                            quality of the bowel preparation was adequate. Scope In: 7:53:18 AM Scope Out: 7:59:44 AM Total Procedure Duration: 0 hours 6 minutes 26 seconds  Findings:      The perianal and digital rectal examinations were normal. Thorough       examination of the rectal mucosa on?"face and retroflexed demonstrated no       abnormalities except for a couple of distal rectal excoriations       secondary to enema tip trauma. Rectal mucosa seen well. I did not see       any scar formation from prior hemorrhoid banding nor did I see any       significant hemorrhoids I advanced the scope in a nice one-to-one       fashion to 45 cm I suspect most of the sigmoid colon was seen and it       appeared normal aside from some formed stool elements. Impression:               Normal sigmoidoscopy to 45 cm- No specimens                            collected. I suspect this patient symptoms are                            emanating from the anorectum. I suspect benign                            anorectal bleeding. I wonder about the efficacy of                            prior hemorrhoid band placement as I did not see                            any scar formation (but also  no significant                            hemorrhoid tissue identified). Occult fissure                            remains a possibility. Moderate Sedation:      Moderate (conscious) sedation was administered by the endoscopy nurse       and supervised by the endoscopist. The following parameters were       monitored: oxygen saturation, heart rate, blood pressure, respiratory       rate, EKG, adequacy of pulmonary ventilation, and response to care.       Total physician intraservice time was 13 minutes. Recommendation:            - Refer to a surgeon at appointment to be                            scheduled. Will refer to Dr. Aviva Signs for                            further evaluation, anoscopy, etc. What ever he                            deems appropriate. Procedure Code(s):        --- Professional ---                           [phone], Sigmoidoscopy, flexible; diagnostic,                            including collection of specimen(s) by brushing or                            washing, when performed (separate procedure)                           G0500, Moderate sedation services provided by the                            same physician or other qualified health care                            professional performing a gastrointestinal                            endoscopic service that sedation supports,                            requiring the presence of an independent trained                            observer to assist in the monitoring of the                            patient's level of consciousness and physiological  status; initial 15 minutes of intra-service time;                            patient age 8 years or older (additional time may                            be reported with [id number], as appropriate) Diagnosis Code(s):        --- Professional ---                           K92.1, Melena (includes Hematochezia) CPT copyright 2019 American Medical Association. All rights reserved. The codes documented in this report are preliminary and upon coder review may  be revised to meet current compliance requirements. Gerrit Friendsobert M. Jenika Chiem, MD Gennette Pacobert Michael Tessla Spurling, MD 03/26/2019 8:27:24 AM This report has been signed electronically. Number of Addenda: 0

## 2019-03-26 NOTE — H&P (Signed)
@LOGO @   Primary Care Physician:  Rogers Blocker, MD Primary Gastroenterologist:  Dr. Gala Romney  Pre-Procedure History & Physical: HPI:  Daniel Nicholson is a 48 y.o. male here for anorectal symptoms itching and bleeding.  Colonoscopy last year.  Status post hemorrhoid banding and topical medical treatment.  Still with symptoms.  Sigmoidoscopy now being done to further evaluate.  Past Medical History:  Diagnosis Date  . Acid reflux   . Diabetes mellitus without complication ([place])   . Gout   . Hemorrhoids    s/p right posterior, left lateral, right anterior banding. Last banding procedure Jan 2015.   [name] Hypertension   . Neck pain   . Poor circulation   . S/P tooth extraction 08/16/2016   three teeth extracted  . Tremors of nervous system     Past Surgical History:  Procedure Laterality Date  . COLONOSCOPY N/A 12/16/2012   Dr. Gala Romney- internal hemorrhoids otherwise normal. Screening in 2024.   [name] COLONOSCOPY N/A 07/11/2017   Dr. Gala Romney: Nonbleeding grade 1 internal hemorrhoids  . HEMORRHOID BANDING    . None      Prior to Admission medications   Medication Sig Start Date End Date Taking? Authorizing Provider  allopurinol (ZYLOPRIM) 300 MG tablet Take 300 mg by mouth every morning.    Yes [provider]  atorvastatin (LIPITOR) 20 MG tablet Take 20 mg by mouth daily.   Yes [provider]  Docusate Calcium (STOOL SOFTENER PO) Take by mouth as needed.   Yes [provider]  hydrocortisone (ANUSOL-HC) 2.5 % rectal cream Place 1 application rectally 3 (three) times daily as needed for hemorrhoids or anal itching.   Yes [provider]  LANTUS SOLOSTAR 100 UNIT/ML Solostar Pen Inject 49 Units into the skin at bedtime.  03/12/19  Yes [provider]  linagliptin (TRADJENTA) 5 MG TABS tablet Take 5 mg by mouth daily.   Yes [provider]  lisinopril-hydrochlorothiazide (PRINZIDE,ZESTORETIC) 10-12.5 MG per tablet Take 1 tablet by mouth every  morning.    Yes [provider]  metFORMIN (GLUCOPHAGE) 1000 MG tablet Take 500 mg by mouth 2 (two) times daily. 02/25/19  Yes [provider]  tamsulosin (FLOMAX) 0.4 MG CAPS capsule Take 0.4 mg by mouth daily.   Yes [provider]  Wheat Dextrin (BENEFIBER PO) Take 1 Scoop by mouth daily as needed (fiber).     [provider]    Allergies as of 01/16/2019  . (No Known Allergies)    Family History  Problem Relation Age of Onset  . Hypertension Mother   . Arthritis Other   . Colon cancer Neg Hx     Social History   Socioeconomic History  . Marital status: Divorced    Spouse name: Not on file  . Number of children: Not on file  . Years of education: 12th grade  . Highest education level: Not on file  Occupational History  . Occupation: unemployed  Tobacco Use  . Smoking status: Current Every Day Smoker    Packs/day: 0.50    Years: 30.00    Pack years: 15.00    Types: Cigarettes  . Smokeless tobacco: Never Used  . Tobacco comment: Smokes 6-7 cigarettes daily  Substance and Sexual Activity  . Alcohol use: Yes    Alcohol/week: 0.0 standard drinks    Comment: occ. beer but rare   . Drug use: No  . Sexual activity: Yes    Birth control/protection: Condom  Other Topics Concern  .  Not on file  Social History Narrative  . Not on file   Social Determinants of Health   Financial Resource Strain:   . Difficulty of Paying Living Expenses: Not on file  Food Insecurity:   . Worried About Programme researcher, broadcasting/film/video in the Last Year: Not on file  . Ran Out of Food in the Last Year: Not on file  Transportation Needs:   . Lack of Transportation (Medical): Not on file  . Lack of Transportation (Non-Medical): Not on file  Physical Activity:   . Days of Exercise per Week: Not on file  . Minutes of Exercise per Session: Not on file  Stress:   . Feeling of Stress : Not on file  Social Connections:   . Frequency of Communication with Friends and  Family: Not on file  . Frequency of Social Gatherings with Friends and Family: Not on file  . Attends Religious Services: Not on file  . Active Member of Clubs or Organizations: Not on file  . Attends Banker Meetings: Not on file  . Marital Status: Not on file  Intimate Partner Violence:   . Fear of Current or Ex-Partner: Not on file  . Emotionally Abused: Not on file  . Physically Abused: Not on file  . Sexually Abused: Not on file    Review of Systems: See HPI, otherwise negative ROS  Physical Exam: BP (!) 125/96   Pulse 85   Temp 98.3 F (36.8 C) (Oral)   Resp 19   Ht 6\' 4"  (1.93 m)   Wt 133.8 kg   SpO2 99%   BMI 35.91 kg/m  General:   Alert,  Well-developed, well-nourished, pleasant and cooperative in NAD Neck:  Supple; no masses or thyromegaly. No significant cervical adenopathy. Lungs:  Clear throughout to auscultation.   No wheezes, crackles, or rhonchi. No acute distress. Heart:  Regular rate and rhythm; no murmurs, clicks, rubs,  or gallops. Abdomen: Non-distended, normal bowel sounds.  Soft and nontender without appreciable mass or hepatosplenomegaly.  Pulses:  Normal pulses noted. Extremities:  Without clubbing or edema.  Impression/Plan: 48 year old gentleman with intermittent rectal bleeding secondary to hemorrhoids.  No improvement with banding and topical medicinal agents.  Sigmoidoscopy to not being done to reevaluate. The risks, benefits, limitations, alternatives and imponderables have been reviewed with the patient. Questions have been answered. All parties are agreeable.      Notice: This dictation was prepared with Dragon dictation along with smaller phrase technology. Any transcriptional errors that result from this process are unintentional and may not be corrected upon review.

## 2019-03-27 ENCOUNTER — Emergency Department (HOSPITAL_COMMUNITY)
Admission: EM | Admit: 2019-03-27 | Discharge: 2019-03-27 | Disposition: A | Discharge status: Home / Self Care | Payer: Medicaid Other | Attending: Emergency Medicine | Admitting: Emergency Medicine

## 2019-03-27 ENCOUNTER — Other Ambulatory Visit: Payer: Self-pay

## 2019-03-27 ENCOUNTER — Encounter (HOSPITAL_COMMUNITY): Payer: Self-pay

## 2019-03-27 DIAGNOSIS — E119 Type 2 diabetes mellitus without complications: Secondary | ICD-10-CM | POA: Insufficient documentation

## 2019-03-27 DIAGNOSIS — F17210 Nicotine dependence, cigarettes, uncomplicated: Secondary | ICD-10-CM | POA: Insufficient documentation

## 2019-03-27 DIAGNOSIS — F411 Generalized anxiety disorder: Secondary | ICD-10-CM | POA: Insufficient documentation

## 2019-03-27 DIAGNOSIS — R0602 Shortness of breath: Secondary | ICD-10-CM | POA: Diagnosis not present

## 2019-03-27 DIAGNOSIS — I10 Essential (primary) hypertension: Secondary | ICD-10-CM | POA: Insufficient documentation

## 2019-03-27 DIAGNOSIS — F419 Anxiety disorder, unspecified: Secondary | ICD-10-CM | POA: Diagnosis present

## 2019-03-27 HISTORY — DX: Personal history of other mental and behavioral disorders: Z86.59

## 2019-03-27 MED ORDER — HYDROXYZINE HCL 25 MG PO TABS: 25.0000 mg | ORAL_TABLET | Freq: Four times a day (QID) | ORAL | 0 refills | Status: DC | PRN

## 2019-03-27 NOTE — ED Triage Notes (Signed)
Pt reports that he has been waking up every morning for the past 2 months anxious. Subsides throughout the day. Pt feels like something is not right  Denies SI/ HI

## 2019-03-27 NOTE — Discharge Instructions (Signed)
You were seen in the emergency department today with anxiety type symptoms.  Your heart tracing was normal.  I am starting a medication to take as needed for anxiety symptoms.  This may cause some drowsiness and so you do not want to drive a car while taking this.  Please keep your appointment with your primary care doctor and return to the emergency department with any new or suddenly worsening symptoms.

## 2019-03-27 NOTE — ED Provider Notes (Signed)
Emergency Department Provider Note   I have reviewed the triage vital signs and the nursing notes.   HISTORY  Chief Complaint Anxiety   HPI Daniel Nicholson is a 48 y.o. male with past medical history reviewed below presents to the emergency department with self described "anxiety attack" symptoms.  Patient symptoms occur intermittently and are often worse in the morning.  He states that sometimes he will feel very anxious with mild SOB and overall feeling of dread. Denies CP or heart palpitations. Denies fever/chills.  The patient has had similar symptoms in the past.  In 2019 he was seen in the emergency department and prescribed Ativan.  He states he takes that occasionally but has since run out.  He has an appointment scheduled with his primary care doctor later this afternoon via telehealth but tells me they have been somewhat resistant to prescribe this medication in the past.  He adamantly denies any suicidal or homicidal ideation.  He does not see a psychiatrist or psychologist.  He is not feeling significant anxiety at this moment. No radiation of symptoms or other modifying factors.    Past Medical History:  Diagnosis Date  . Acid reflux   . Diabetes mellitus without complication ([facility])   . Gout   . Hemorrhoids    s/p right posterior, left lateral, right anterior banding. Last banding procedure Jan 2015.   [name] Hx of anxiety disorder   . Hypertension   . Neck pain   . Poor circulation   . S/P tooth extraction 08/16/2016   three teeth extracted  . Tremors of nervous system     Patient Active Problem List   Diagnosis Date Noted  . Rectal itching 12/10/2018  . Rectal pain 06/19/2018  . Hemorrhoids 05/14/2017  . Constipation 05/14/2017  . Rhabdomyolysis 12/27/2016  . AKI (acute kidney injury) ([facility]) 12/27/2016  . Fatty liver 12/05/2012  . Rectal bleeding 12/05/2012  . Gross hematuria 12/05/2012  . 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 Acid reflux   . Gout   . FOOT PAIN  01/30/2008    Past Surgical History:  Procedure Laterality Date  . COLONOSCOPY N/A 12/16/2012   Dr. Jena Gauss- internal hemorrhoids otherwise normal. Screening in 2024.   [name] COLONOSCOPY N/A 07/11/2017   Dr. Jena Gauss: Nonbleeding grade 1 internal hemorrhoids  . HEMORRHOID BANDING    . None      Allergies Patient has no known allergies.  Family History  Problem Relation Age of Onset  . Hypertension Mother   . Arthritis Other   . Colon cancer Neg Hx     Social History Social History   Tobacco Use  . Smoking status: Current Every Day Smoker    Packs/day: 0.50    Years: 30.00    Pack years: 15.00    Types: Cigarettes  . Smokeless tobacco: Never Used  . Tobacco comment: Smokes 6-7 cigarettes daily  Substance Use Topics  . Alcohol use: Yes    Alcohol/week: 0.0 standard drinks    Comment: occ. beer but rare   . Drug use: No    Review of Systems  Constitutional: No fever/chills. Positive anxiety symptoms.  Eyes: No visual changes. ENT: No sore throat. Cardiovascular: Denies chest pain. Respiratory: Occasional shortness of breath. Gastrointestinal: No abdominal pain. No nausea, no vomiting. No diarrhea. No constipation. Genitourinary: Negative for dysuria. Musculoskeletal: Negative for back pain. Skin: Negative for rash. Neurological: Negative for headaches, focal weakness or numbness.  10-point ROS otherwise negative.  ____________________________________________   PHYSICAL EXAM:  VITAL SIGNS: ED Triage Vitals  Enc Vitals Group     BP 03/27/19 0836 (!) 143/107     Pulse Rate 03/27/19 0836 82     Resp 03/27/19 0836 18     Temp 03/27/19 0836 98.3 F (36.8 C)     Temp Source 03/27/19 0836 Oral     SpO2 03/27/19 0836 97 %     Weight 03/27/19 0833 295 lb (133.8 kg)     Height 03/27/19 0833 6\' 4"  (1.93 m)   Constitutional: Alert and oriented. Well appearing and in no acute distress. Frequent foot tapping and appears just slightly anxious on exam.  Eyes: Conjunctivae  are normal.  Head: Atraumatic. Nose: No congestion/rhinnorhea. Mouth/Throat: Mucous membranes are moist.   Neck: No stridor.   Cardiovascular: Normal rate, regular rhythm. Good peripheral circulation. Grossly normal heart sounds.   Respiratory: Normal respiratory effort.  No retractions. Lungs CTAB. Gastrointestinal: No distention.  Musculoskeletal: No lower extremity tenderness nor edema.  Neurologic:  Normal speech and language. Skin:  Skin is warm, dry and intact. No rash noted. ____________________________________________  EKG   EKG Interpretation  Date/Time:  Thursday March 27 2019 09:20:54 EST Ventricular Rate:  70 PR Interval:    QRS Duration: 97 QT Interval:  394 QTC Calculation: 426 R Axis:   43 Text Interpretation: Sinus rhythm No STEMI Confirmed by Nanda Quinton [phone]) on 03/27/2019 9:24:54 AM      ____________________________________________   PROCEDURES  Procedure(s) performed:   Procedures  None ____________________________________________   INITIAL IMPRESSION / ASSESSMENT AND PLAN / ED COURSE  Pertinent labs & imaging results that were available during my care of the patient were reviewed by me and considered in my medical decision making (see chart for details).   Patient presents to the emergency department with anxiety type symptoms.  He has had this presentation in the past which improved with Ativan.  I considered underlying medical etiology for his symptoms.  Patient has normal vital signs.  No chest pain or other findings to suspect acute coronary syndrome, PE, or aortic pathology.   EKG unremarkable. Plan for Atarax and keep appointment with PCP this afternoon. Discussed ED return precautions.  ____________________________________________  FINAL CLINICAL IMPRESSION(S) / ED DIAGNOSES  Final diagnoses:  Anxiety state    NEW OUTPATIENT MEDICATIONS STARTED DURING THIS VISIT:  Discharge Medication List as of 03/27/2019  9:26 AM    START  taking these medications   Details  hydrOXYzine (ATARAX/VISTARIL) 25 MG tablet Take 1 tablet (25 mg total) by mouth every 6 (six) hours as needed for anxiety., Starting Thu 03/27/2019, Normal        Note:  This document was prepared using Dragon voice recognition software and may include unintentional dictation errors.  Nanda Quinton, MD, [facility] Emergency Medicine    Heaton Sarin, [facility], MD 03/27/19 [phone]

## 2019-04-01 ENCOUNTER — Other Ambulatory Visit: Payer: Self-pay | Admitting: Nurse Practitioner

## 2019-04-15 ENCOUNTER — Other Ambulatory Visit: Payer: Self-pay

## 2019-04-15 ENCOUNTER — Ambulatory Visit: Payer: Medicaid Other | Admitting: General Surgery

## 2019-04-15 ENCOUNTER — Encounter: Payer: Self-pay | Admitting: General Surgery

## 2019-04-15 VITALS — BP 149/84 | HR 99 | Temp 98.6°F | Resp 18 | Ht 76.0 in | Wt 302.0 lb

## 2019-04-15 DIAGNOSIS — L290 Pruritus ani: Secondary | ICD-10-CM

## 2019-04-15 NOTE — Progress Notes (Signed)
Daniel Nicholson; [id number]; 1970/08/02   HPI Patient is a 49 year old black male who was referred to my care by Dr. Gala Romney for evaluation treatment of rectal pain.  He was referred for the possibility of an anal fissure.  Patient states that he has had intermittent perirectal pain and itching for some time now.  This has been present for over a year.  He states it occurs approximately every 2 weeks.  He does have primarily loose bowel movements.  He denies any pain radiating to his tailbone.  He sometimes does not completely evacuate himself.  He does take Colace, fiber, and occasionally MiraLAX.  He has noticed some blood on the toilet paper when he wipes himself.  He has undergone internal hemorrhoidal banding in the past.  He recently underwent a flexible sigmoidoscopy in the fall of last year which was unremarkable.  He currently has 0 out of 10 rectal pain. Past Medical History:  Diagnosis Date  . Acid reflux   . Diabetes mellitus without complication ([place])   . Gout   . Hemorrhoids    s/p right posterior, left lateral, right anterior banding. Last banding procedure Jan 2015.   [name] Hx of anxiety disorder   . Hypertension   . Neck pain   . Poor circulation   . S/P tooth extraction 08/16/2016   three teeth extracted  . Tremors of nervous system     Past Surgical History:  Procedure Laterality Date  . COLONOSCOPY N/A 12/16/2012   Dr. Gala Romney- internal hemorrhoids otherwise normal. Screening in 2024.   [name] COLONOSCOPY N/A 07/11/2017   Dr. Gala Romney: Nonbleeding grade 1 internal hemorrhoids  . FLEXIBLE SIGMOIDOSCOPY N/A 03/26/2019   Procedure: FLEXIBLE SIGMOIDOSCOPY;  Surgeon: Daneil Dolin, MD;  Location: [facility];  Service: Endoscopy;  Laterality: N/A;  7:30am  . HEMORRHOID BANDING    . None      Family History  Problem Relation Age of Onset  . Hypertension Mother   . Arthritis Other   . Colon cancer Neg Hx     Current Outpatient Medications on File Prior to Visit  Medication Sig  Dispense Refill  . allopurinol (ZYLOPRIM) 300 MG tablet Take 300 mg by mouth every morning.     [name] atorvastatin (LIPITOR) 20 MG tablet Take 20 mg by mouth daily.    Mariane Baumgarten Calcium (STOOL SOFTENER PO) Take by mouth as needed.    . hydrocortisone (ANUSOL-HC) 2.5 % rectal cream Place rectally 2 (two) times daily for 14 days. 30 g 0  . hydrOXYzine (ATARAX/VISTARIL) 25 MG tablet Take 1 tablet (25 mg total) by mouth every 6 (six) hours as needed for anxiety. 12 tablet 0  . LANTUS SOLOSTAR 100 UNIT/ML Solostar Pen Inject 49 Units into the skin at bedtime.     [name] linagliptin (TRADJENTA) 5 MG TABS tablet Take 5 mg by mouth daily.    [name] lisinopril-hydrochlorothiazide (PRINZIDE,ZESTORETIC) 10-12.5 MG per tablet Take 1 tablet by mouth every morning.     . metFORMIN (GLUCOPHAGE) 1000 MG tablet Take 500 mg by mouth 2 (two) times daily.    . tamsulosin (FLOMAX) 0.4 MG CAPS capsule Take 0.4 mg by mouth daily.    . Wheat Dextrin (BENEFIBER PO) Take 1 Scoop by mouth daily as needed (fiber).      No current facility-administered medications on file prior to visit.    No Known Allergies  Social History   Substance and Sexual Activity  Alcohol Use Yes  . Alcohol/week: 0.0 standard drinks  Comment: occ. beer but rare     Social History   Tobacco Use  Smoking Status Current Every Day Smoker  . Packs/day: 0.50  . Years: 30.00  . Pack years: 15.00  . Types: Cigarettes  Smokeless Tobacco Never Used  Tobacco Comment   Smokes 6-7 cigarettes daily    Review of Systems  Constitutional: Negative.   HENT: Negative.   Eyes: Negative.   Respiratory: Negative.   Cardiovascular: Negative.   Gastrointestinal: Positive for heartburn.  Genitourinary: Negative.   Musculoskeletal: Negative.   Skin: Negative.   Neurological: Negative.   Endo/Heme/Allergies: Negative.   Psychiatric/Behavioral: Negative.     Objective   Vitals:   04/15/19 1341  BP: (!) 149/84  Pulse: 99  Resp: 18  Temp: 98.6 F  (37 C)  SpO2: 95%    Physical Exam Vitals reviewed.  Constitutional:      Appearance: Normal appearance. He is obese. He is not ill-appearing.  HENT:     Head: Normocephalic and atraumatic.  Cardiovascular:     Rate and Rhythm: Normal rate and regular rhythm.     Heart sounds: Normal heart sounds. No murmur. No friction rub. No gallop.   Pulmonary:     Effort: Pulmonary effort is normal. No respiratory distress.     Breath sounds: Normal breath sounds. No stridor. No wheezing, rhonchi or rales.  Genitourinary:    Comments: Rectal examination reveals perianal skin excoriation which is mild in nature.  No open wounds are present.  No sentinel tag is present.  I could not appreciate an anal fissure.  No external hemorrhoids were present.  Sphincter tone was normal.  No stricture was palpable.  No rectal masses noted.  No blood present. Skin:    General: Skin is warm and dry.  Neurological:     Mental Status: He is alert and oriented to person, place, and time.   Previous flexible sigmoidoscopy report reviewed  Assessment  Pruritus ani.  I do not see evidence of an anal fissure. Plan   I told the patient that he needed to firm up his bowels.  He is to decrease his Colace use and change the fiber in his diet.  He does already use medicated wipes and pats himself dry.  I would not recommend any perianal cream at this time.  I told him that this can be a difficult process to fully cure.  He was instructed to follow-up with me should he continue to have issues over the next few months.  No need for surgical intervention at this time.  Patient understands this and agrees.  Follow-up as needed.

## 2019-04-15 NOTE — Patient Instructions (Signed)
Anal Pruritus Anal pruritus is an itchy feeling in the anus and on the skin around the anus. This is common and can be caused by many things. It often occurs when the area becomes moist. Moisture may be due to sweating or to a small amount of stool (feces) that is left on the area because of poor personal cleaning. Some other causes include:  Things that can irritate your skin, such as: ? Perfumed soaps and sprays. ? Colored or scented toilet paper. ? Excessive washing.  Certain foods, such as caffeine, beer, and spicy foods.  Diarrhea or loose stool.  Skin disorders (psoriasis, eczema, or seborrhea).  Hemorrhoids, fissures, infections, and other anal diseases.  Other medical conditions, such as diabetes, thyroid problems, STIs (sexually transmitted infections), or some cancers. In many cases, the cause is not known. The itching usually goes away with treatment and home care. Scratching can cause further skin damage and make the problem worse. Follow these instructions at home: Skin care      Practice good hygiene. ? Clean the anal area gently with wet toilet paper or a wet washcloth after every bowel movement and at bedtime. ? Avoid using soaps on the anal area. ? Dry the area thoroughly. Pat the area dry with toilet paper or a towel.  Do not scrub the anal area with anything, including toilet paper.  Do not scratch the itchy area. Scratching causes more damage and makes the itching worse.  Take sitz baths as told by your health care provider. ? A sitz bath is a warm water bath that only comes up to your hips and covers your buttocks. A sitz bath may be done at home in a bathtub or with a portable sitz bath that fits over the toilet. ? Pat the area dry with a soft cloth after each bath.  Use creams or ointments as told by your health care provider. Zinc oxide ointment or a moisture barrier cream can be applied several times a day to protect and heal the skin.  Do not use  anything that irritates the skin, such as bubble baths, scented toilet paper, or genital deodorants. General instructions  Pay attention to any changes in your symptoms.  Take or apply over-the-counter and prescription medicines only as told by your health care provider.  Avoid overusing medicines that help you have a bowel movement (laxatives). These can cause you to have loose stools.  Talk with your health care provider about whether you should increase the fiber in your diet. This can help keep your stool normal if you have frequent loose stools.  Limit or avoid foods that may cause your symptoms. These may include: ? Spicy foods, such as salsa, jalapeo peppers, and spicy seasonings. ? Caffeine or beer. ? Milk products. ? Chocolate, nuts, citrus fruits, or tomatoes.  Wear cotton underwear and loose clothing.  Keep all follow-up visits as told by your health care provider. This is important. Contact a health care provider if:  Your itching does not improve in several days.  Your itching gets worse.  You have a fever.  You have redness, swelling, or pain in the anal area.  You have fluid, blood, or pus coming from the anal area. Summary  Anal pruritus is an itchy feeling in the anus and the skin in the anal area. This can be caused by many things, such as things that irritate your skin and certain medical conditions.  Take or apply over-the-counter and prescription medicines only as told   by your health care provider.  Practice good hygiene as told by your health care provider.  Talk with your health care provider about fiber supplements. These are helpful in keeping your stool normal if you have frequent loose stools.  Contact a health care provider if your symptoms get worse or if you develop new symptoms. This information is not intended to replace advice given to you by your health care provider. Make sure you discuss any questions you have with your health care  provider. Document Revised: 08/06/2017 Document Reviewed: 08/06/2017 Elsevier Patient Education  2020 [employer]  

## 2019-04-22 ENCOUNTER — Ambulatory Visit (INDEPENDENT_AMBULATORY_CARE_PROVIDER_SITE_OTHER): Payer: Medicaid Other | Admitting: Urology

## 2019-04-22 ENCOUNTER — Encounter: Payer: Self-pay | Admitting: Urology

## 2019-04-22 ENCOUNTER — Other Ambulatory Visit: Payer: Self-pay

## 2019-04-22 VITALS — BP 136/92 | HR 85 | Temp 95.0°F | Ht 76.0 in | Wt 295.0 lb

## 2019-04-22 DIAGNOSIS — N179 Acute kidney failure, unspecified: Secondary | ICD-10-CM | POA: Diagnosis not present

## 2019-04-22 LAB — POCT URINALYSIS DIPSTICK
Bilirubin, UA: NEGATIVE
Blood, UA: NEGATIVE
Glucose, UA: NEGATIVE
Ketones, UA: NEGATIVE
Nitrite, UA: NEGATIVE
Protein, UA: POSITIVE — AB
Spec Grav, UA: 1.02 (ref 1.010–1.025)
Urobilinogen, UA: NEGATIVE E.U./dL — AB
pH, UA: 7 (ref 5.0–8.0)

## 2019-04-22 NOTE — Progress Notes (Signed)
H&P  Chief Complaint: Erectile Dysfunction  History of Present Illness:   1.12.2021: Here today for follow-up having been prescribed sildenafil for management of his ED. Since last visit, he was started on tamsulosin and begun taking "super beta prostate" supplements, which he believes is helping his ED. He denies having had any urological sx's or complaints, though he did state he was having some rectal pain and possible dysuria (unclear based on reported hx). He does think that these medications have helped his sx's. He did have some issues having his sildenafil rx filled due to cost, but he did eventually have this filled. He reports that when he tried it, he was able to achieve erection but reports having had blood leak from his penis afterwards which was extremely concerning.   (below copied from [facility] records):  Daniel Nicholson is a 49 year-old male patient who was referred by Cline Crock, NP, NP who is here for erectile dysfunction.  He first stated noticing pain on approximately 01/08/2018. His symptoms did begin gradually. His symptoms have been worse over the last year.   He does have difficulties achieving an erection. He does have problems maintaining his erections. His erections are straight.   10.6.2020: He presents today having had recent issues with erectile dysfunction that have worsened over the last year. He reports that he will very rarely have sufficient erection for penetration but even then it does not remain hard for very long. He denies any recent penile injury or trauma. He maintains a strong libido despite these issues. He is a smoker, is diabetic, and high BP -- he has been treated for diabetes for roughly 20 years.   Past Medical History:  Diagnosis Date  . Acid reflux   . Diabetes mellitus without complication (HCC)   . Gout   . Hemorrhoids    s/p right posterior, left lateral, right anterior banding. Last banding procedure Jan 2015.   [name] Hx of anxiety  disorder   . Hypertension   . Neck pain   . Poor circulation   . S/P tooth extraction 08/16/2016   three teeth extracted  . Tremors of nervous system     Past Surgical History:  Procedure Laterality Date  . COLONOSCOPY N/A 12/16/2012   Dr. Jena Gauss- internal hemorrhoids otherwise normal. Screening in 2024.   [name] COLONOSCOPY N/A 07/11/2017   Dr. Jena Gauss: Nonbleeding grade 1 internal hemorrhoids  . FLEXIBLE SIGMOIDOSCOPY N/A 03/26/2019   Procedure: FLEXIBLE SIGMOIDOSCOPY;  Surgeon: Corbin Ade, MD;  Location: [facility];  Service: Endoscopy;  Laterality: N/A;  7:30am  . HEMORRHOID BANDING    . None      Home Medications:  Allergies as of 04/22/2019   No Known Allergies     Medication List       Accurate as of April 22, 2019 11:39 AM. If you have any questions, ask your nurse or doctor.        allopurinol 300 MG tablet Commonly known as: ZYLOPRIM Take 300 mg by mouth every morning.   atorvastatin 20 MG tablet Commonly known as: LIPITOR Take 20 mg by mouth daily.   BENEFIBER PO Take 1 Scoop by mouth daily as needed (fiber).   hydrOXYzine 25 MG tablet Commonly known as: ATARAX/VISTARIL Take 1 tablet (25 mg total) by mouth every 6 (six) hours as needed for anxiety.   Lantus SoloStar 100 UNIT/ML Solostar Pen Generic drug: Insulin Glargine Inject 49 Units into the skin at bedtime.   lisinopril-hydrochlorothiazide 10-12.5 MG  tablet Commonly known as: ZESTORETIC Take 1 tablet by mouth every morning.   Litetouch Pen Needles 31G X 8 MM Misc Generic drug: Insulin Pen Needle USING TWICE DAILY.T   metFORMIN 1000 MG tablet Commonly known as: GLUCOPHAGE Take 500 mg by mouth 2 (two) times daily.   STOOL SOFTENER PO Take by mouth as needed.   tamsulosin 0.4 MG Caps capsule Commonly known as: FLOMAX Take 0.4 mg by mouth daily.   Tradjenta 5 MG Tabs tablet Generic drug: linagliptin Take 5 mg by mouth daily.       Allergies: No Known Allergies  Family History   Problem Relation Age of Onset  . Hypertension Mother   . Arthritis Other   . Colon cancer Neg Hx     Social History:  reports that he has been smoking cigarettes. He has a 15.00 pack-year smoking history. He has never used smokeless tobacco. He reports current alcohol use. He reports that he does not use drugs.  ROS: A complete review of systems was performed.  All systems are negative except for pertinent findings as noted.  Physical Exam:  Vital signs in last 24 hours: BP (!) 136/92   Pulse 85   Temp (!) 95 F (35 C)   Ht 6\' 4"  (1.93 m)   Wt 295 lb (133.8 kg)   BMI 35.91 kg/m  Constitutional:  Alert and oriented, No acute distress Cardiovascular: Regular rate  Respiratory: Normal respiratory effort GI: Abdomen is soft, nontender, nondistended, no abdominal masses. No CVAT.  Genitourinary: Not completed Lymphatic: No lymphadenopathy Neurologic: Grossly intact, no focal deficits Psychiatric: Normal mood and affect  Laboratory Data:  No results for input(s): WBC, HGB, HCT, PLT in the last 72 hours.  No results for input(s): NA, K, CL, GLUCOSE, BUN, CALCIUM, CREATININE in the last 72 hours.  Invalid input(s): CO3   No results found for this or any previous visit (from the past 24 hour(s)). No results found for this or any previous visit (from the past 240 hour(s)).  Renal Function: No results for input(s): CREATININE in the last 168 hours. CrCl cannot be calculated (Patient's most recent lab result is older than the maximum 21 days allowed.).  Radiologic Imaging: No results found.  Impression/Assessment:  He is not very satisfied with sildenafil and is worried this provoked his recent blood per penis. He continues to smoke, is diabetic, and has high BP -- all three are likely exacerbating his ED. He was assured that sildenafil was likely not the cause for his penile bleeding.   It is likely that the tamsulosin he was started on was to address this recent penile  bleeding but this is not neccesary given he is not having urinary sx's .  Plan:  1. He was assured that it was safe to remain on sildenafil. He was given information about PGE injections as next step for management of his sx's but he was not at all interested in this.   2. Advised to try to quit smoking and increase cardiovascular exercise.   3. He will call if interested in further management of ED.  4. Keep scheduled follow-up (if not in system, schedule for 3 mo's).   5. Stop tamsulosin. He was advised that tamsulosin would not likely help his ED. He has no LUTS to treat

## 2019-07-22 ENCOUNTER — Ambulatory Visit: Payer: Medicaid Other | Admitting: Urology

## 2020-01-09 ENCOUNTER — Other Ambulatory Visit: Payer: Self-pay | Admitting: Family Medicine

## 2020-02-13 ENCOUNTER — Ambulatory Visit: Payer: Medicaid Other | Admitting: Internal Medicine

## 2020-02-17 ENCOUNTER — Ambulatory Visit: Payer: Medicaid Other | Admitting: Internal Medicine

## 2020-02-27 ENCOUNTER — Other Ambulatory Visit: Payer: Self-pay

## 2020-02-27 ENCOUNTER — Ambulatory Visit (INDEPENDENT_AMBULATORY_CARE_PROVIDER_SITE_OTHER): Payer: Medicaid Other | Admitting: Internal Medicine

## 2020-02-27 ENCOUNTER — Encounter: Payer: Self-pay | Admitting: Internal Medicine

## 2020-02-27 VITALS — BP 143/102 | HR 77 | Temp 97.2°F | Ht 76.0 in | Wt 308.0 lb

## 2020-02-27 DIAGNOSIS — L290 Pruritus ani: Secondary | ICD-10-CM | POA: Diagnosis not present

## 2020-02-27 DIAGNOSIS — R151 Fecal smearing: Secondary | ICD-10-CM | POA: Diagnosis not present

## 2020-02-27 MED ORDER — HYOSCYAMINE SULFATE ER 0.375 MG PO TB12: ORAL_TABLET | ORAL | 3 refills | Status: DC

## 2020-02-27 NOTE — Patient Instructions (Signed)
Trial of zeasorb AF powder - apply around anus twice daily - over the counter  Trial of Lev-bid .375mg  tablets on each morning to slow BM's (disp 30 with 3 refills)  Metformin may be causing some of your stool problems - we may nee to change it in the near future.  Office visit with me in 1 month.  Call me in 2 weeks and let me know you are doing.

## 2020-02-27 NOTE — Progress Notes (Signed)
Primary Care Physician:  Cline Crock, NP Primary Gastroenterologist:  Dr. Jena Gauss  Pre-Procedure History & Physical: HPI:  Daniel Nicholson is a 49 y.o. male here for for ongoing issues of rectal seepage perianal skin irritation. 10 tendency towards loose thick sticky stools. No bleeding. History of hemorrhoid banding-multiple sessions. To date on colonoscopy sigmoidoscopy. Seen by Dr. Lovell Sheehan. Felt to have a localized moisture/irritation issue involving the perianal skin.  Issues present in association with taking Metformin every day. He takes Benefiber but he states this makes the situation worse.  Past Medical History:  Diagnosis Date  . Acid reflux   . Diabetes mellitus without complication (HCC)   . Gout   . Hemorrhoids    s/p right posterior, left lateral, right anterior banding. Last banding procedure Jan 2015.   [name] Hx of anxiety disorder   . Hypertension   . Neck pain   . Poor circulation   . S/P tooth extraction 08/16/2016   three teeth extracted  . Tremors of nervous system     Past Surgical History:  Procedure Laterality Date  . COLONOSCOPY N/A 12/16/2012   Dr. Jena Gauss- internal hemorrhoids otherwise normal. Screening in 2024.   [name] COLONOSCOPY N/A 07/11/2017   Dr. Jena Gauss: Nonbleeding grade 1 internal hemorrhoids  . FLEXIBLE SIGMOIDOSCOPY N/A 03/26/2019   Procedure: FLEXIBLE SIGMOIDOSCOPY;  Surgeon: Corbin Ade, MD;  Location: [facility];  Service: Endoscopy;  Laterality: N/A;  7:30am  . HEMORRHOID BANDING    . None      Prior to Admission medications   Medication Sig Start Date End Date Taking? Authorizing Provider  allopurinol (ZYLOPRIM) 300 MG tablet Take 300 mg by mouth every morning.    Yes [provider]  atorvastatin (LIPITOR) 20 MG tablet Take 20 mg by mouth daily.   Yes [provider]  Docusate Calcium (STOOL SOFTENER PO) Take by mouth as needed.   Yes [provider]  LANTUS SOLOSTAR 100 UNIT/ML Solostar Pen  Inject 49 Units into the skin at bedtime.  03/12/19  Yes [provider]  linagliptin (TRADJENTA) 5 MG TABS tablet Take 5 mg by mouth daily.   Yes [provider]  lisinopril-hydrochlorothiazide (PRINZIDE,ZESTORETIC) 10-12.5 MG per tablet Take 1 tablet by mouth every morning.    Yes [provider]  metFORMIN (GLUCOPHAGE) 1000 MG tablet Take 500 mg by mouth 2 (two) times daily. 02/25/19  Yes [provider]  tamsulosin (FLOMAX) 0.4 MG CAPS capsule Take 0.4 mg by mouth daily.   Yes [provider]  Wheat Dextrin (BENEFIBER PO) Take 1 Scoop by mouth daily as needed (fiber).    Yes [provider]  hydrOXYzine (ATARAX/VISTARIL) 25 MG tablet Take 1 tablet (25 mg total) by mouth every 6 (six) hours as needed for anxiety. Patient not taking: Reported on 02/27/2020 03/27/19   Long, Arlyss Repress, MD  LITETOUCH PEN NEEDLES 31G X 8 MM MISC USING TWICE DAILY.T 03/31/19   [provider]    Allergies as of 02/27/2020  . (No Known Allergies)    Family History  Problem Relation Age of Onset  . Hypertension Mother   . Arthritis Other   . Colon cancer Neg Hx     Social History   Socioeconomic History  . Marital status: Divorced    Spouse name: Not on file  . Number of children: Not on file  . Years of education: 12th grade  . Highest education level: Not on file  Occupational History  .  Occupation: unemployed  Tobacco Use  . Smoking status: Current Every Day Smoker    Packs/day: 0.50    Years: 30.00    Pack years: 15.00    Types: Cigarettes  . Smokeless tobacco: Never Used  . Tobacco comment: Smokes 6-7 cigarettes daily  Vaping Use  . Vaping Use: Former  Substance and Sexual Activity  . Alcohol use: Yes    Alcohol/week: 0.0 standard drinks    Comment: occ. beer but rare   . Drug use: No  . Sexual activity: Yes    Birth control/protection: Condom  Other Topics Concern  . Not on file  Social History Narrative  . Not on file     Social Determinants of Health   Financial Resource Strain:   . Difficulty of Paying Living Expenses: Not on file  Food Insecurity:   . Worried About Programme researcher, broadcasting/film/video in the Last Year: Not on file  . Ran Out of Food in the Last Year: Not on file  Transportation Needs:   . Lack of Transportation (Medical): Not on file  . Lack of Transportation (Non-Medical): Not on file  Physical Activity:   . Days of Exercise per Week: Not on file  . Minutes of Exercise per Session: Not on file  Stress:   . Feeling of Stress : Not on file  Social Connections:   . Frequency of Communication with Friends and Family: Not on file  . Frequency of Social Gatherings with Friends and Family: Not on file  . Attends Religious Services: Not on file  . Active Member of Clubs or Organizations: Not on file  . Attends Banker Meetings: Not on file  . Marital Status: Not on file  Intimate Partner Violence:   . Fear of Current or Ex-Partner: Not on file  . Emotionally Abused: Not on file  . Physically Abused: Not on file  . Sexually Abused: Not on file    Review of Systems: See HPI, otherwise negative ROS  Physical Exam: BP (!) 143/102   Pulse 77   Temp (!) 97.2 F (36.2 C) (Temporal)   Ht 6\' 4"  (1.93 m)   Wt (!) 308 lb (139.7 kg)   BMI 37.49 kg/m  General:   Alert,  pleasant and cooperative in NAD Lungs:  Clear throughout to auscultation.   No wheezes, crackles, or rhonchi. No acute distress. Heart:  Regular rate and rhythm; no murmurs, clicks, rubs,  or gallops. Abdomen: Obese. Soft and nontender without appreciable mass or hepatosplenomegaly.  Pulses:  Normal pulses noted. Extremities:  Without clubbing or edema. Examination of the perianal area. No external lesions. I see no perianal skin irritation. No external hemorrhoids.  Impression/Plan: Obese 49 year old gentleman with perianal irritation and a tendency towards poorly formed stool but no true diarrhea. He has a significant  intertriginous area between his buttocks in part secondary to his body habitus. I have a suspicion that Metformin is contributing to his suboptimal stool consistency.   Recommendations:  Trial of zeasorb AF powder - apply around anus twice daily - over the counter  Trial of Lev-bid .375mg  tablets on each morning to slow BM's (disp 30 with 3 refills)  Metformin may be causing some of stool problems - we may need to change out to another regimen   Office visit with me in 1 month.  Call me in 2 weeks and let me know you are doing.       Notice: This dictation was prepared with Dragon dictation  along with smaller phrase technology. Any transcriptional errors that result from this process are unintentional and may not be corrected upon review.

## 2020-03-18 ENCOUNTER — Telehealth: Payer: Self-pay | Admitting: Internal Medicine

## 2020-03-18 NOTE — Telephone Encounter (Signed)
FYI Spoke with pt. Pt called with a progress report. The Levbid has helped slow down pts bowel movements. Pt states he didn't like the way the Zeasorb AF powder made him feel but feels his symptoms are a little better since he started the Levbid. Pt will follow up at his next ov 03/30/20.

## 2020-03-18 NOTE — Telephone Encounter (Signed)
Patient called and said that the pills dr Jena Gauss wanted him to try are working but the powder did not work and he did not like it  [phone]

## 2020-03-19 NOTE — Telephone Encounter (Signed)
Noted  

## 2020-03-19 NOTE — Telephone Encounter (Signed)
Communication noted.  No change in management at this time.

## 2020-03-30 ENCOUNTER — Ambulatory Visit (INDEPENDENT_AMBULATORY_CARE_PROVIDER_SITE_OTHER): Payer: Medicaid Other | Admitting: Internal Medicine

## 2020-03-30 ENCOUNTER — Other Ambulatory Visit: Payer: Self-pay

## 2020-03-30 ENCOUNTER — Encounter: Payer: Self-pay | Admitting: Internal Medicine

## 2020-03-30 VITALS — BP 143/98 | HR 82 | Temp 96.8°F | Ht 76.0 in | Wt 311.6 lb

## 2020-03-30 DIAGNOSIS — L290 Pruritus ani: Secondary | ICD-10-CM

## 2020-03-30 DIAGNOSIS — R151 Fecal smearing: Secondary | ICD-10-CM | POA: Diagnosis not present

## 2020-03-30 DIAGNOSIS — M940 Chondrocostal junction syndrome [Tietze]: Secondary | ICD-10-CM | POA: Diagnosis not present

## 2020-03-30 NOTE — Patient Instructions (Signed)
Continue Lev-Bid one tablet daily  Your right sided pain is coming from your lower ribs - try Advil  - 2 tablets twice daily x 5 days  If no improvement, see primary care doctor roe further evaluation  Office visit in 6 months

## 2020-03-30 NOTE — Progress Notes (Signed)
Primary Care Physician:  Cline Crock, NP Primary Gastroenterologist:  Dr. Jena Gauss  Pre-Procedure History & Physical: HPI:  Daniel Nicholson is a 49 y.o. male here for follow-up of anorectal symptoms.  He states taking 1 Levbid daily has normalized bowel function and has greatly improved his anorectal symptoms.  No further itching or hygiene issues.  He is not having any bleeding.  He is very pleased he did not like the Zeasorb -it felt uncomfortable to his skin. He is happy with his progress.  He continues on Metformin  New problem is right lateral lower rib pain started spontaneously a week ago its intermittent throughout the day.  Not associate with feeding movement or bowel function.  No history of trauma.  Past Medical History:  Diagnosis Date  . Acid reflux   . Diabetes mellitus without complication (HCC)   . Gout   . Hemorrhoids    s/p right posterior, left lateral, right anterior banding. Last banding procedure Jan 2015.   [name] Hx of anxiety disorder   . Hypertension   . Neck pain   . Poor circulation   . S/P tooth extraction 08/16/2016   three teeth extracted  . Tremors of nervous system     Past Surgical History:  Procedure Laterality Date  . COLONOSCOPY N/A 12/16/2012   Dr. Jena Gauss- internal hemorrhoids otherwise normal. Screening in 2024.   [name] COLONOSCOPY N/A 07/11/2017   Dr. Jena Gauss: Nonbleeding grade 1 internal hemorrhoids  . FLEXIBLE SIGMOIDOSCOPY N/A 03/26/2019   Procedure: FLEXIBLE SIGMOIDOSCOPY;  Surgeon: Corbin Ade, MD;  Location: [facility];  Service: Endoscopy;  Laterality: N/A;  7:30am  . HEMORRHOID BANDING    . None      Prior to Admission medications   Medication Sig Start Date End Date Taking? Authorizing Provider  allopurinol (ZYLOPRIM) 300 MG tablet Take 300 mg by mouth every morning.   Yes [provider]  atorvastatin (LIPITOR) 20 MG tablet Take 20 mg by mouth daily.   Yes [provider]  hydrOXYzine (ATARAX/VISTARIL)  25 MG tablet Take 1 tablet (25 mg total) by mouth every 6 (six) hours as needed for anxiety. Patient taking differently: Take 25 mg by mouth as needed for anxiety. 03/27/19  Yes Long, Arlyss Repress, MD  hyoscyamine (LEVBID) 0.375 MG 12 hr tablet Take one tab each morning to slow bowel movements. Patient taking differently: Take one tab every night to slow bowel movements. 02/27/20  Yes Roxana Lai, Gerrit Friends, MD  LANTUS SOLOSTAR 100 UNIT/ML Solostar Pen Inject 49 Units into the skin at bedtime.  03/12/19  Yes [provider]  linagliptin (TRADJENTA) 5 MG TABS tablet Take 5 mg by mouth daily.   Yes [provider]  lisinopril-hydrochlorothiazide (PRINZIDE,ZESTORETIC) 10-12.5 MG per tablet Take 1 tablet by mouth every morning.    Yes [provider]  LITETOUCH PEN NEEDLES 31G X 8 MM MISC USING TWICE DAILY.T 03/31/19  Yes [provider]  metFORMIN (GLUCOPHAGE) 1000 MG tablet Take 500 mg by mouth 2 (two) times daily. 02/25/19  Yes [provider]  tamsulosin (FLOMAX) 0.4 MG CAPS capsule Take 0.4 mg by mouth daily.   Yes [provider]  Docusate Calcium (STOOL SOFTENER PO) Take by mouth as needed. Patient not taking: Reported on 03/30/2020    [provider]  Wheat Dextrin (BENEFIBER PO) Take 1 Scoop by mouth daily as needed (fiber).  Patient not taking: Reported on 03/30/2020    [provider]    Allergies  as of 03/30/2020  . (No Known Allergies)    Family History  Problem Relation Age of Onset  . Hypertension Mother   . Arthritis Other   . Colon cancer Neg Hx     Social History   Socioeconomic History  . Marital status: Divorced    Spouse name: Not on file  . Number of children: Not on file  . Years of education: 12th grade  . Highest education level: Not on file  Occupational History  . Occupation: unemployed  Tobacco Use  . Smoking status: Current Every Day Smoker    Packs/day: 0.50    Years: 30.00    Pack years:  15.00    Types: Cigarettes  . Smokeless tobacco: Never Used  . Tobacco comment: Smokes 6-7 cigarettes daily  Vaping Use  . Vaping Use: Former  Substance and Sexual Activity  . Alcohol use: Yes    Alcohol/week: 0.0 standard drinks    Comment: occ. beer but rare   . Drug use: No  . Sexual activity: Yes    Birth control/protection: Condom  Other Topics Concern  . Not on file  Social History Narrative  . Not on file   Social Determinants of Health   Financial Resource Strain: Not on file  Food Insecurity: Not on file  Transportation Needs: Not on file  Physical Activity: Not on file  Stress: Not on file  Social Connections: Not on file  Intimate Partner Violence: Not on file    Review of Systems: See HPI, otherwise negative ROS  Physical Exam: BP (!) 143/98   Pulse 82   Temp (!) 96.8 F (36 C) (Temporal)   Ht 6\' 4"  (1.93 m)   Wt (!) 311 lb 9.6 oz (141.3 kg)   BMI 37.93 kg/m  General:   Alert, morbidly obese.  Pleasant and cooperative in NAD Nose:  No deformity, discharge,  or lesions. Mouth:  No deformity or lesions. Neck:  Supple; no masses or thyromegaly. No significant cervical adenopathy. Lungs:  Clear throughout to auscultation.   No wheezes, crackles, or rhonchi. No acute distress. Heart:  Regular rate and rhythm; no murmurs, clicks, rubs,  or gallops. Abdomen: Massively obese.  Soft nontender without obvious mass organomegaly Patient is tender along his right lateral costal margin.  He is clearly tender over lying his lower rib on the right side.  No bony deformity or mass appreciated Pulses:  Normal pulses noted. Extremities:  Without clubbing or edema.  Impression/Plan: Ano-rectal symptoms have subsided with Levbid once daily.  He is very pleased.  No alarm symptoms at this time. New problem of recent right lateral lower rib cage pain.  Based on physical examination, it is arising from his lower right rib.  Nothing to suggest new visceral pathology.  I  suspect costochondritis.  Recommendations: Continue Levbid 1 daily               Advil 2 tablets twice daily x5 days.  If this does not alleviate rib discomfort, see PCP for    further evaluation/management     Office visit here in 6 months and as needed.      Notice: This dictation was prepared with Dragon dictation along with smaller phrase technology. Any transcriptional errors that result from this process are unintentional and may not be corrected upon review.

## 2020-06-28 ENCOUNTER — Other Ambulatory Visit: Payer: Self-pay

## 2020-06-28 ENCOUNTER — Telehealth: Payer: Self-pay | Admitting: Internal Medicine

## 2020-06-28 MED ORDER — HYOSCYAMINE SULFATE ER 0.375 MG PO TB12: 0.3750 mg | ORAL_TABLET | Freq: Two times a day (BID) | ORAL | 5 refills | Status: DC

## 2020-06-28 NOTE — Telephone Encounter (Signed)
Communication noted.  We can increase Levbid to 1 tablet in the morning and 1 tablet at bedtime (twice daily).  Dispense 60 with 5 refills.

## 2020-06-28 NOTE — Telephone Encounter (Signed)
Spoke with pt. Pt is aware that his RX was sent to [facility]. PT is to take Levbid 1 tab in the morning and 1 tab at bedtime.

## 2020-06-28 NOTE — Telephone Encounter (Signed)
Pt is requesting a refill on Hyoscyamine and if we could increase the dosage. He uses [facility].

## 2020-06-28 NOTE — Telephone Encounter (Signed)
Spoke with pt. When pt started taking the Hyoscyamine 0.37, it was working well. Pt is asking if the dosage can be increased due to his bowels moving more now? Pt is having 3 bowel movements daily at this time.

## 2020-07-07 ENCOUNTER — Other Ambulatory Visit: Payer: Self-pay

## 2020-07-07 ENCOUNTER — Encounter (HOSPITAL_COMMUNITY): Payer: Self-pay | Admitting: Emergency Medicine

## 2020-07-07 ENCOUNTER — Emergency Department (HOSPITAL_COMMUNITY)
Admission: EM | Admit: 2020-07-07 | Discharge: 2020-07-07 | Disposition: A | Discharge status: Home / Self Care | Payer: Medicaid Other | Attending: Emergency Medicine | Admitting: Emergency Medicine

## 2020-07-07 DIAGNOSIS — F17210 Nicotine dependence, cigarettes, uncomplicated: Secondary | ICD-10-CM | POA: Insufficient documentation

## 2020-07-07 DIAGNOSIS — L02412 Cutaneous abscess of left axilla: Secondary | ICD-10-CM | POA: Insufficient documentation

## 2020-07-07 DIAGNOSIS — Z7984 Long term (current) use of oral hypoglycemic drugs: Secondary | ICD-10-CM | POA: Diagnosis not present

## 2020-07-07 DIAGNOSIS — Z794 Long term (current) use of insulin: Secondary | ICD-10-CM | POA: Insufficient documentation

## 2020-07-07 DIAGNOSIS — Z79899 Other long term (current) drug therapy: Secondary | ICD-10-CM | POA: Insufficient documentation

## 2020-07-07 DIAGNOSIS — I10 Essential (primary) hypertension: Secondary | ICD-10-CM | POA: Diagnosis not present

## 2020-07-07 DIAGNOSIS — E119 Type 2 diabetes mellitus without complications: Secondary | ICD-10-CM | POA: Diagnosis not present

## 2020-07-07 MED ORDER — LIDOCAINE-EPINEPHRINE (PF) 2 %-1:200000 IJ SOLN
INTRAMUSCULAR | Status: AC
  Filled 2020-07-07: qty 20

## 2020-07-07 MED ORDER — DOXYCYCLINE HYCLATE 100 MG PO CAPS: 100.0000 mg | ORAL_CAPSULE | Freq: Two times a day (BID) | ORAL | 0 refills | Status: DC

## 2020-07-07 MED ORDER — LIDOCAINE-EPINEPHRINE (PF) 2 %-1:200000 IJ SOLN
20.0000 mL | Freq: Once | INTRAMUSCULAR | Status: AC
  Administered 2020-07-07: 20 mL

## 2020-07-07 MED ORDER — OXYCODONE-ACETAMINOPHEN 5-325 MG PO TABS: 1.0000 | ORAL_TABLET | Freq: Three times a day (TID) | ORAL | 0 refills | Status: DC | PRN

## 2020-07-07 MED ORDER — HYDROMORPHONE HCL 1 MG/ML IJ SOLN
1.0000 mg | Freq: Once | INTRAMUSCULAR | Status: AC
  Administered 2020-07-07: 1 mg via INTRAMUSCULAR
  Filled 2020-07-07: qty 1

## 2020-07-07 NOTE — ED Triage Notes (Signed)
Pt c/o abscess to left arm pit x one week.

## 2020-07-07 NOTE — ED Provider Notes (Signed)
[facility] EMERGENCY DEPARTMENT Provider Note   CSN: [id number] Arrival date & time: 07/07/20  5176     History Chief Complaint  Patient presents with  . Abscess    Daniel ANTILLON is a 50 y.o. male.  The history is provided by the patient.  Abscess Location:  Shoulder/arm Shoulder/arm abscess location:  L axilla Abscess quality: painful and redness   Abscess quality: no fluctuance   Duration:  3 days Progression:  Worsening Pain details:    Quality:  Dull   Severity:  Severe   Timing:  Constant   Progression:  Worsening Chronicity:  New Context: diabetes   Relieved by:  Nothing Exacerbated by: movement and palpation. Associated symptoms: no fever and no vomiting   Risk factors: prior abscess   Patient with history of diabetes, hypertension presenting with abscess to left axilla for the past 3 days.  He reports pain is worsening.  He has been using an unknown antibiotic that his girlfriend gave him but it is not helping his symptoms peer     Past Medical History:  Diagnosis Date  . Acid reflux   . Diabetes mellitus without complication (HCC)   . Gout   . Hemorrhoids    s/p right posterior, left lateral, right anterior banding. Last banding procedure Jan 2015.   [name] Hx of anxiety disorder   . Hypertension   . Neck pain   . Poor circulation   . S/P tooth extraction 08/16/2016   three teeth extracted  . Tremors of nervous system     Patient Active Problem List   Diagnosis Date Noted  . Rectal itching 12/10/2018  . Rectal pain 06/19/2018  . Hemorrhoids 05/14/2017  . Constipation 05/14/2017  . Rhabdomyolysis 12/27/2016  . AKI (acute kidney injury) (HCC) 12/27/2016  . Fatty liver 12/05/2012  . Rectal bleeding 12/05/2012  . Gross hematuria 12/05/2012  . 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 Acid reflux   . Gout   . FOOT PAIN 01/30/2008    Past Surgical History:  Procedure Laterality Date  . COLONOSCOPY N/A 12/16/2012   Dr. Jena Gauss- internal hemorrhoids  otherwise normal. Screening in 2024.   [name] COLONOSCOPY N/A 07/11/2017   Dr. Jena Gauss: Nonbleeding grade 1 internal hemorrhoids  . FLEXIBLE SIGMOIDOSCOPY N/A 03/26/2019   Procedure: FLEXIBLE SIGMOIDOSCOPY;  Surgeon: Corbin Ade, MD;  Location: [facility];  Service: Endoscopy;  Laterality: N/A;  7:30am  . HEMORRHOID BANDING    . None         Family History  Problem Relation Age of Onset  . Hypertension Mother   . Arthritis Other   . Colon cancer Neg Hx     Social History   Tobacco Use  . Smoking status: Current Every Day Smoker    Packs/day: 0.50    Years: 30.00    Pack years: 15.00    Types: Cigarettes  . Smokeless tobacco: Never Used  . Tobacco comment: Smokes 6-7 cigarettes daily  Vaping Use  . Vaping Use: Former  Substance Use Topics  . Alcohol use: Yes    Alcohol/week: 0.0 standard drinks    Comment: occ. beer but rare   . Drug use: No    Home Medications Prior to Admission medications   Medication Sig Start Date End Date Taking? Authorizing Provider  allopurinol (ZYLOPRIM) 300 MG tablet Take 300 mg by mouth every morning.    [provider]  atorvastatin (LIPITOR) 20 MG tablet Take 20 mg by mouth daily.    [provider]  Docusate Calcium (STOOL SOFTENER PO) Take by mouth as needed. Patient not taking: Reported on 03/30/2020    [provider]  hydrOXYzine (ATARAX/VISTARIL) 25 MG tablet Take 1 tablet (25 mg total) by mouth every 6 (six) hours as needed for anxiety. Patient taking differently: Take 25 mg by mouth as needed for anxiety. 03/27/19   Long, Arlyss Repress, MD  hyoscyamine (LEVBID) 0.375 MG 12 hr tablet Take one tab each morning to slow bowel movements. Patient taking differently: Take one tab every night to slow bowel movements. 02/27/20   Rourk, Gerrit Friends, MD  hyoscyamine (LEVBID) 0.375 MG 12 hr tablet Take 1 tablet (0.375 mg total) by mouth 2 (two) times daily. One tab in the morning and one tab at bed time 06/28/20   Rourk, Gerrit Friends, MD  LANTUS SOLOSTAR 100 UNIT/ML Solostar Pen Inject 49 Units into the skin at bedtime.  03/12/19   [provider]  linagliptin (TRADJENTA) 5 MG TABS tablet Take 5 mg by mouth daily.    [provider]  lisinopril-hydrochlorothiazide (PRINZIDE,ZESTORETIC) 10-12.5 MG per tablet Take 1 tablet by mouth every morning.     [provider]  LITETOUCH PEN NEEDLES 31G X 8 MM MISC USING TWICE DAILY.T 03/31/19   [provider]  metFORMIN (GLUCOPHAGE) 1000 MG tablet Take 500 mg by mouth 2 (two) times daily. 02/25/19   [provider]  tamsulosin (FLOMAX) 0.4 MG CAPS capsule Take 0.4 mg by mouth daily.    [provider]  Wheat Dextrin (BENEFIBER PO) Take 1 Scoop by mouth daily as needed (fiber).  Patient not taking: Reported on 03/30/2020    [provider]    Allergies    Patient has no known allergies.  Review of Systems   Review of Systems  Constitutional: Negative for fever.  Gastrointestinal: Negative for vomiting.  Skin: Positive for wound.  All other systems reviewed and are negative.   Physical Exam Updated Vital Signs BP (!) 139/97   Pulse 91   Temp 98.3 F (36.8 C)   Resp 17   Ht 1.93 m (6\' 4" )   Wt (!) 179.2 kg   SpO2 99%   BMI 48.08 kg/m   Physical Exam  CONSTITUTIONAL: Well developed/well nourished HEAD: Normocephalic/atraumatic EYES: EOMI NECK: supple no meningeal signs SPINE/BACK:entire spine nontender CV: S1/S2 noted, no murmurs/rubs/gallops noted LUNGS: Lungs are clear to auscultation bilaterally, no apparent distress ABDOMEN: soft, nontender NEURO: Pt is awake/alert/appropriate, moves all extremitiesx4.  No facial droop.   EXTREMITIES: pulses normal/equal, full ROM SKIN: warm, color normal, abscess noted to left upper chest, fluctuance noted.  No crepitus. PSYCH: Anxious  ED Results / Procedures / Treatments   Labs (all labs ordered are listed, but only abnormal results are displayed) Labs  Reviewed - No data to display  EKG None  Radiology No results found.  Procedures . Incision and Drainage  Date/Time: 07/07/2020 2:53 AM Performed by: 07/09/2020, MD Authorized by: [name], MD   Consent:    Consent obtained:  Verbal   Consent given by:  Patient   Risks discussed:  Incomplete drainage and pain   Alternatives discussed:  No treatment Universal protocol:    Patient identity confirmed:  Verbally with patient Location:    Type:  Abscess   Location:  Trunk   Trunk location: left chest/axilla. Pre-procedure details:    Skin preparation:  Povidone-iodine Anesthesia:    Anesthesia method:  Local infiltration   Local anesthetic:  Lidocaine 2% WITH  epi Procedure type:    Complexity:  Simple Procedure details:    Incision types:  Single straight   Wound management:  Probed and deloculated   Drainage:  Purulent   Drainage amount:  Copious   Wound treatment:  Wound left open Post-procedure details:    Procedure completion:  Tolerated well, no immediate complications     Medications Ordered in ED Medications  lidocaine-EPINEPHrine (XYLOCAINE W/EPI) 2 %-1:200000 (PF) injection (  Canceled Entry 07/07/20 0257)  HYDROmorphone (DILAUDID) injection 1 mg (1 mg Intramuscular Given 07/07/20 0253)  lidocaine-EPINEPHrine (XYLOCAINE W/EPI) 2 %-1:200000 (PF) injection 20 mL (20 mLs Infiltration Given 07/07/20 0253)    ED Course  I have reviewed the triage vital signs and the nursing notes.       MDM Rules/Calculators/A&P                          We will place patient on doxycycline and pain medication.  Significant drainage was noted from the abscess. We discussed return precautions Final Clinical Impression(s) / ED Diagnoses Final diagnoses:  Abscess of left axilla    Rx / DC Orders ED Discharge Orders         Ordered    oxyCODONE-acetaminophen (PERCOCET) 5-325 MG tablet  Every 8 hours PRN        07/07/20 0336    doxycycline (VIBRAMYCIN) 100 MG  capsule  2 times daily        07/07/20 0336           [name], MD 07/07/20 [phone]

## 2020-09-27 ENCOUNTER — Encounter: Payer: Self-pay | Admitting: Internal Medicine

## 2020-09-30 ENCOUNTER — Other Ambulatory Visit (HOSPITAL_COMMUNITY): Payer: Self-pay | Admitting: Nurse Practitioner

## 2020-09-30 DIAGNOSIS — M542 Cervicalgia: Secondary | ICD-10-CM

## 2020-10-12 ENCOUNTER — Other Ambulatory Visit: Payer: Self-pay

## 2020-10-12 ENCOUNTER — Ambulatory Visit: Payer: Medicaid Other | Admitting: Internal Medicine

## 2020-10-12 ENCOUNTER — Encounter: Payer: Self-pay | Admitting: Internal Medicine

## 2020-10-12 VITALS — BP 124/87 | HR 92 | Temp 97.7°F | Ht 76.0 in | Wt 297.0 lb

## 2020-10-12 DIAGNOSIS — R151 Fecal smearing: Secondary | ICD-10-CM

## 2020-10-12 NOTE — Patient Instructions (Signed)
As discussed, continue Levbid 1 tablet twice daily  Good job on weight loss!  If you could lose another 20 pounds in the next year, that would be of great benefit to your health overall.  Ideally, would like to see come off metformin entirely  We will plan to see you back in 1 year.  If you have any interim issues, please do not hesitate to call.

## 2020-10-12 NOTE — Progress Notes (Signed)
Primary Care Physician:  Cline Crock, NP Primary Gastroenterologist:  Dr. Jena Gauss  Pre-Procedure History & Physical: HPI:  Daniel Nicholson is a 50 y.o. male here for follow-up of fecal smearing some difficulties with hygiene.  History of distant hemorrhoid banding.  Local hygiene measures and the addition of Levbid 1 tablet twice daily is been of significant benefit in improving his symptoms.  He is not had any bleeding.  Overall he is much improved over how he was doing last year.  Up-to-date on colonoscopy/sigmoidoscopy without significant anorectal /rectosigmoid findings Has been able lose 11 pounds since his November visit here.  It sounds like to me PCP may be decreasing his metformin which I am happy to see him.  Metformin may have a much to do with his anorectal symptoms.  Past Medical History:  Diagnosis Date   Acid reflux    Diabetes mellitus without complication (HCC)    Gout    Hemorrhoids    s/p right posterior, left lateral, right anterior banding. Last banding procedure Jan 2015.    Hx of anxiety disorder    Hypertension    Neck pain    Poor circulation    S/P tooth extraction 08/16/2016   three teeth extracted   Tremors of nervous system     Past Surgical History:  Procedure Laterality Date   COLONOSCOPY N/A 12/16/2012   Dr. Jena Gauss- internal hemorrhoids otherwise normal. Screening in 2024.    COLONOSCOPY N/A 07/11/2017   Dr. Jena Gauss: Nonbleeding grade 1 internal hemorrhoids   FLEXIBLE SIGMOIDOSCOPY N/A 03/26/2019   Procedure: FLEXIBLE SIGMOIDOSCOPY;  Surgeon: Corbin Ade, MD;  Location: [facility];  Service: Endoscopy;  Laterality: N/A;  7:30am   HEMORRHOID BANDING     None      Prior to Admission medications   Medication Sig Start Date End Date Taking? Authorizing Provider  allopurinol (ZYLOPRIM) 300 MG tablet Take 300 mg by mouth every morning.   Yes [provider]  atorvastatin (LIPITOR) 20 MG tablet Take 20 mg by mouth daily.   Yes  [provider]  Docusate Calcium (STOOL SOFTENER PO) Take by mouth as needed.   Yes [provider]  hydrOXYzine (ATARAX/VISTARIL) 25 MG tablet Take 1 tablet (25 mg total) by mouth every 6 (six) hours as needed for anxiety. Patient taking differently: Take 25 mg by mouth as needed for anxiety. 03/27/19  Yes Long, Arlyss Repress, MD  hyoscyamine (LEVBID) 0.375 MG 12 hr tablet Take 1 tablet (0.375 mg total) by mouth 2 (two) times daily. One tab in the morning and one tab at bed time 06/28/20  Yes Britnee Mcdevitt, Gerrit Friends, MD  LANTUS SOLOSTAR 100 UNIT/ML Solostar Pen Inject 49 Units into the skin at bedtime.  03/12/19  Yes [provider]  linagliptin (TRADJENTA) 5 MG TABS tablet Take 5 mg by mouth daily.   Yes [provider]  lisinopril-hydrochlorothiazide (PRINZIDE,ZESTORETIC) 10-12.5 MG per tablet Take 1 tablet by mouth every morning.    Yes [provider]  LITETOUCH PEN NEEDLES 31G X 8 MM MISC USING TWICE DAILY.T 03/31/19  Yes [provider]  metFORMIN (GLUCOPHAGE) 1000 MG tablet Take 500 mg by mouth 2 (two) times daily. 02/25/19  Yes [provider]  oxyCODONE-acetaminophen (PERCOCET) 5-325 MG tablet Take 1 tablet by mouth every 8 (eight) hours as needed for severe pain. 07/07/20  Yes [name], MD  tamsulosin (FLOMAX) 0.4 MG CAPS capsule Take 0.4 mg by mouth daily.   Yes [provider]  Wheat Dextrin (BENEFIBER PO) Take 1 Scoop by mouth daily as needed (fiber).   Yes [provider]    Allergies as of 10/12/2020   (No Known Allergies)    Family History  Problem Relation Age of Onset   Hypertension Mother    Arthritis Other    Colon cancer Neg Hx     Social History   Socioeconomic History   Marital status: Divorced    Spouse name: Not on file   Number of children: Not on file   Years of education: 12th grade   Highest education level: Not on file  Occupational History   Occupation: unemployed  Tobacco Use    Smoking status: Every Day    Packs/day: 0.50    Years: 30.00    Pack years: 15.00    Types: Cigarettes   Smokeless tobacco: Never   Tobacco comments:    Smokes 6-7 cigarettes daily  Vaping Use   Vaping Use: Former  Substance and Sexual Activity   Alcohol use: Yes    Alcohol/week: 0.0 standard drinks    Comment: occ. beer but rare    Drug use: No   Sexual activity: Yes    Birth control/protection: Condom  Other Topics Concern   Not on file  Social History Narrative   Not on file   Social Determinants of Health   Financial Resource Strain: Not on file  Food Insecurity: Not on file  Transportation Needs: Not on file  Physical Activity: Not on file  Stress: Not on file  Social Connections: Not on file  Intimate Partner Violence: Not on file    Review of Systems: See HPI, otherwise negative ROS  Physical Exam: BP 124/87   Pulse 92   Temp 97.7 F (36.5 C) (Temporal)   Ht 6\' 4"  (1.93 m)   Wt 297 lb (134.7 kg)   BMI 36.15 kg/m  General:   Alert,  Well-developed, well-nourished, pleasant and cooperative in NAD  Impression/Plan: 50 year old gentleman with a history of perineal irritation and some fecal smearing overall improved with Levbid twice daily.  As discussed, minimization of metformin will likely help his situation as well.  He is also be commended on 11 pound weight loss since November of last year based on our scales.  Recommendations:  As discussed, continue Levbid 1 tablet twice daily  Loose another 20 pounds in the next year, that would be of great benefit toverall.  Ideally, would like to see come off metformin entirely  1 year office follow-up  If patient has any interim issues, he is to call me.     Notice: This dictation was prepared with Dragon dictation along with smaller phrase technology. Any transcriptional errors that result from this process are unintentional and may not be corrected upon review.

## 2020-10-28 ENCOUNTER — Ambulatory Visit (HOSPITAL_COMMUNITY): Payer: Medicaid Other | Attending: Student | Admitting: Physical Therapy

## 2020-10-28 ENCOUNTER — Other Ambulatory Visit: Payer: Self-pay

## 2020-10-28 ENCOUNTER — Encounter (HOSPITAL_COMMUNITY): Payer: Self-pay | Admitting: Physical Therapy

## 2020-10-28 DIAGNOSIS — R29898 Other symptoms and signs involving the musculoskeletal system: Secondary | ICD-10-CM

## 2020-10-28 DIAGNOSIS — M542 Cervicalgia: Secondary | ICD-10-CM | POA: Insufficient documentation

## 2020-10-28 NOTE — Therapy (Signed)
[facility] [address] [place], [place], [address] Phone: [phone]   Fax:  [phone]  Physical Therapy Evaluation  Patient Details  Name: Daniel Nicholson MRN: [id number] Date of Birth: October 09, 1970 Referring Provider (PT): Maylon Cos FNP  Encounter Date: 10/28/2020   PT End of Session - 10/28/20 1131     Visit Number 1    Number of Visits 8    Date for PT Re-Evaluation 12/23/20    Authorization Type medicaid traditional - check auth    Authorization - Visit Number 0    Authorization - Number of Visits 0    PT Start Time 1133    PT Stop Time 1205    PT Time Calculation (min) 32 min    Activity Tolerance Patient tolerated treatment well             Past Medical History:  Diagnosis Date   Acid reflux    Diabetes mellitus without complication (HCC)    Gout    Hemorrhoids    s/p right posterior, left lateral, right anterior banding. Last banding procedure Jan 2015.    Hx of anxiety disorder    Hypertension    Neck pain    Poor circulation    S/P tooth extraction 08/16/2016   three teeth extracted   Tremors of nervous system     Past Surgical History:  Procedure Laterality Date   COLONOSCOPY N/A 12/16/2012   Dr. Jena Gauss- internal hemorrhoids otherwise normal. Screening in 2024.    COLONOSCOPY N/A 07/11/2017   Dr. Jena Gauss: Nonbleeding grade 1 internal hemorrhoids   FLEXIBLE SIGMOIDOSCOPY N/A 03/26/2019   Procedure: FLEXIBLE SIGMOIDOSCOPY;  Surgeon: Corbin Ade, MD;  [facility];  Service: Endoscopy;  Laterality: N/A;  7:30am   HEMORRHOID BANDING     None      There were no vitals filed for this visit.    Subjective Assessment - 10/28/20 1138     Subjective States that he has been having neck pain for the last 2 years. States that the pain has worsened over time. States that looking down makes his pain worse. States that laying down makes it feel better. States that he does take meloxicam as  needed and it helps a little. States he has not any neck surgery. States that did an xray. States that he doesn't currently have any follow up apt with his MD.    Currently in Pain? Yes    Pain Score 5     Pain Location Neck    Pain Orientation Left;Right;Lower;Mid    Pain Descriptors / Indicators Tightness;Throbbing    Pain Radiating Towards down spine    Pain Onset More than a month ago    Pain Frequency Intermittent    Aggravating Factors  looking down    Pain Relieving Factors pain meds, electrical stim                [facility] PT Assessment - 10/28/20 0001       Assessment   Medical Diagnosis neck pain    Referring Provider (PT) Kimberlt Meyran FNP    Next MD Visit --   none at this time   Prior Therapy no      Balance Screen   Has the patient fallen in the past 6 months No      Prior Function   Level of Independence Independent    Vocation On disability    Vocation Requirements mows yards  Leisure reading, driving      Cognition   Overall Cognitive Status Within Functional Limits for tasks assessed      Observation/Other Assessments   Focus on Therapeutic Outcomes (FOTO)  NA      Posture/Postural Control   Posture/Postural Control Postural limitations    Postural Limitations Rounded Shoulders;Forward head;Increased thoracic kyphosis;Posterior pelvic tilt;Flexed trunk   head tilt to right     ROM / Strength   AROM / PROM / Strength AROM;Strength      AROM   AROM Assessment Site Cervical;Shoulder    Right/Left Shoulder Left;Right    Right Shoulder Flexion 120 Degrees   per visual estimation   Right Shoulder ABduction 95 Degrees   per visual estimation, increase in neck pain   Right Shoulder Internal Rotation --   reaches to L4 SP, pain in ipsilateral shoulder   Right Shoulder External Rotation --   reaches to T1 SP   Left Shoulder Flexion 120 Degrees   per visual estimation   Left Shoulder ABduction 95 Degrees   per visual estimation, increase in neck pain    Left Shoulder Internal Rotation --   reaches to T12  SP, pain in ipsilateral shoulder   Left Shoulder External Rotation --   reaches to T1 SP   Cervical Flexion --   WNL   Cervical Extension 12    Cervical - Right Side Bend 34    Cervical - Left Side Bend 24    Cervical - Right Rotation 72   pain along back of neck   Cervical - Left Rotation 65   pain along back of neck     Strength   Strength Assessment Site Shoulder    Right/Left Shoulder Left;Right    Right Shoulder Flexion 4+/5    Right Shoulder ABduction 4+/5    Right Shoulder Internal Rotation 4+/5    Right Shoulder External Rotation 4+/5    Left Shoulder Flexion 4+/5    Left Shoulder ABduction 4+/5    Left Shoulder Internal Rotation 4+/5    Left Shoulder External Rotation 4+/5      Palpation   Spinal mobility hypomobility noted in cervical and thoracic spine    Palpation comment increased resting tone noted in bilateral UT                        Objective measurements completed on examination: See above findings.               PT Education - 10/28/20 1144     Education Details on Dry needling, on current POC, on posture and active vs passive sitting posture    Person(s) Educated Patient    Methods Explanation    Comprehension Verbalized understanding              PT Short Term Goals - 10/28/20 1208       PT SHORT TERM GOAL #1   Title Patient will be independent in self management strategies to improve quality of life and functional outcomes.    Time 4    Period Weeks    Status New    Target Date 11/25/20      PT SHORT TERM GOAL #2   Title Patient will report at least 25% improvement in overall symptoms and/or function to demonstrate improved functional mobility    Time 4    Period Weeks    Status New    Target Date 11/25/20  PT SHORT TERM GOAL #3   Title Patient will be able to demonstrate painfree cervical ROM    Time 4    Period Weeks    Status New    Target  Date 11/25/20               PT Long Term Goals - 10/28/20 1209       PT LONG TERM GOAL #1   Title Patient will be able to demonstrate at least 105 degrees of shoulder abduction to demonstrate improved shoulder mobility    Time 8    Period Weeks    Status New    Target Date 12/23/20      PT LONG TERM GOAL #2   Title Patient will be able to demonstrate good seated posture for at least 2 minutes without cues from PT to demonstrate improved sitting posture    Time 8    Period Weeks    Status New    Target Date 12/23/20      PT LONG TERM GOAL #3   Title Patient will report at least 50% improvement in overall symptoms and/or function to demonstrate improved functional mobility    Time 8    Period Weeks    Status New    Target Date 12/23/20                    Plan - 10/28/20 1211     Clinical Impression Statement Patient is a 50 y.o. male who presents to physical therapy with complaint of chronic neck pain. Patient demonstrates decreased strength, ROM restriction, reduced flexibility and postural abnormalities which are likely contributing to symptoms of pain and are negatively impacting patient ability to perform ADLs. Patient will benefit from skilled physical therapy services to address these deficits to reduce pain and improve level of function with ADLs    Personal Factors and Comorbidities Comorbidity 1;Comorbidity 2    Comorbidities DB, HTN    Examination-Activity Limitations Lift;Reach Overhead;Carry;Sleep    Examination-Participation Restrictions Driving;Cleaning;Yard Work    Conservation officer, historic buildings Evolving/Moderate complexity    Clinical Decision Making Moderate    Rehab Potential Fair    PT Frequency 1x / week    PT Duration 8 weeks    PT Treatment/Interventions ADLs/Self Care Home Management;Cryotherapy;Electrical Stimulation;Traction;Moist Heat;Therapeutic exercise;Manual techniques;Therapeutic activities;Patient/family  education;Neuromuscular re-education;Passive range of motion;Dry needling    PT Next Visit Plan posture, trial traction, STM as needed for pain, cervical/shoulder ROM, pelvic tilts/core strengthening, focus on HEP development - DN if indciated    PT Home Exercise Plan on active seated posture throughout the day    Consulted and Agree with Plan of Care Patient             Patient will benefit from skilled therapeutic intervention in order to improve the following deficits and impairments:  Pain, Decreased strength, Decreased activity tolerance, Decreased range of motion, Improper body mechanics, Postural dysfunction, Hypomobility  Visit Diagnosis: Cervicalgia - Plan: PT plan of care cert/re-cert  Decreased range of motion of neck - Plan: PT plan of care cert/re-cert     Problem List Patient Active Problem List   Diagnosis Date Noted   Rectal itching 12/10/2018   Rectal pain 06/19/2018   Hemorrhoids 05/14/2017   Constipation 05/14/2017   Rhabdomyolysis 12/27/2016   AKI (acute kidney injury) (HCC) 12/27/2016   Fatty liver 12/05/2012   Rectal bleeding 12/05/2012   Gross hematuria 12/05/2012   Arthritis, wrist 01/10/2011   Acid reflux  Gout    FOOT PAIN 01/30/2008   1:17 PM, 10/28/20 Tereasa CoopMichele Leaann Nevils, DPT Physical [facility]  [phone] office   [facility] [address] [place], [place], [address] Phone: [phone]   Fax:  [phone]  Name: Lester CarolinaGregory G Coke MRN: [id number] Date of Birth: 1970-08-08

## 2020-10-28 NOTE — Patient Instructions (Signed)

## 2020-11-04 ENCOUNTER — Ambulatory Visit (HOSPITAL_COMMUNITY): Payer: Medicaid Other | Admitting: Physical Therapy

## 2020-11-11 ENCOUNTER — Ambulatory Visit (HOSPITAL_COMMUNITY): Payer: Medicaid Other | Attending: Student | Admitting: Physical Therapy

## 2020-11-11 ENCOUNTER — Other Ambulatory Visit: Payer: Self-pay

## 2020-11-11 ENCOUNTER — Encounter (HOSPITAL_COMMUNITY): Payer: Self-pay | Admitting: Physical Therapy

## 2020-11-11 DIAGNOSIS — M542 Cervicalgia: Secondary | ICD-10-CM | POA: Insufficient documentation

## 2020-11-11 DIAGNOSIS — R29898 Other symptoms and signs involving the musculoskeletal system: Secondary | ICD-10-CM | POA: Insufficient documentation

## 2020-11-11 NOTE — Therapy (Signed)
[facility] [address] [place], [place], [address] Phone: [phone]   Fax:  [phone]  Physical Therapy Treatment  Patient Details  Name: Daniel Nicholson MRN: [id number] Date of Birth: [birth year] Referring Provider (PT): Maylon Cos FNP   Encounter Date: 11/11/2020   PT End of Session - 11/11/20 0912     Visit Number 2    Number of Visits 8    Date for PT Re-Evaluation 12/23/20    Authorization Type medicaid traditional    Authorization Time Period 3 visits approved 7/25- 8/14    Authorization - Visit Number 1    Authorization - Number of Visits 3    PT Start Time 0914    PT Stop Time 0954    PT Time Calculation (min) 40 min    Activity Tolerance Patient tolerated treatment well    Behavior During Therapy [facility] for tasks assessed/performed             Past Medical History:  Diagnosis Date   Acid reflux    Diabetes mellitus without complication (HCC)    Gout    Hemorrhoids    s/p right posterior, left lateral, right anterior banding. Last banding procedure Jan 2015.    Hx of anxiety disorder    Hypertension    Neck pain    Poor circulation    S/P tooth extraction 08/16/2016   three teeth extracted   Tremors of nervous system     Past Surgical History:  Procedure Laterality Date   COLONOSCOPY N/A 12/16/2012   Dr. Jena Gauss- internal hemorrhoids otherwise normal. Screening in 2024.    COLONOSCOPY N/A 07/11/2017   Dr. Jena Gauss: Nonbleeding grade 1 internal hemorrhoids   FLEXIBLE SIGMOIDOSCOPY N/A 03/26/2019   Procedure: FLEXIBLE SIGMOIDOSCOPY;  Surgeon: Corbin Ade, MD;  [facility];  Service: Endoscopy;  Laterality: N/A;  7:30am   HEMORRHOID BANDING     None      There were no vitals filed for this visit.   Subjective Assessment - 11/11/20 0913     Subjective His neck is still hurting. Looking down continues to be most difficult and painful.    Currently in Pain? Yes    Pain Score 3     Pain  Location Neck    Pain Descriptors / Indicators Sharp    Pain Type Chronic pain    Pain Onset More than a month ago                               [facility] Adult PT Treatment/Exercise - 11/11/20 0001       Exercises   Exercises Neck      Neck Exercises: Standing   Other Standing Exercises cervical retraction isometric at wall with towel 5-10 second holds 2x5      Neck Exercises: Seated   Other Seated Exercise scap retraction depression 3x 10; thoracic extension over chair 10x 5 second holds      Neck Exercises: Supine   Neck Retraction 10 reps;3 secs    Neck Retraction Limitations 3 sets    Other Supine Exercise decompression with band 1-4 green band 1x 10 each                    PT Education - 11/11/20 0913     Education Details HEP, posture    Person(s) Educated Patient    Methods Explanation;Handout    Comprehension  Verbalized understanding              PT Short Term Goals - 11/11/20 0946       PT SHORT TERM GOAL #1   Title Patient will be independent in self management strategies to improve quality of life and functional outcomes.    Time 4    Period Weeks    Status On-going    Target Date 11/25/20      PT SHORT TERM GOAL #2   Title Patient will report at least 25% improvement in overall symptoms and/or function to demonstrate improved functional mobility    Time 4    Period Weeks    Status On-going    Target Date 11/25/20      PT SHORT TERM GOAL #3   Title Patient will be able to demonstrate painfree cervical ROM    Time 4    Period Weeks    Status On-going    Target Date 11/25/20               PT Long Term Goals - 11/11/20 0946       PT LONG TERM GOAL #1   Title Patient will be able to demonstrate at least 105 degrees of shoulder abduction to demonstrate improved shoulder mobility    Time 8    Period Weeks    Status On-going      PT LONG TERM GOAL #2   Title Patient will be able to demonstrate good seated  posture for at least 2 minutes without cues from PT to demonstrate improved sitting posture    Time 8    Period Weeks    Status On-going      PT LONG TERM GOAL #3   Title Patient will report at least 50% improvement in overall symptoms and/or function to demonstrate improved functional mobility    Time 8    Period Weeks    Status On-going                   Plan - 11/11/20 0912     Clinical Impression Statement Patient frequently holding head in severe forward head posture. Began session with cervical retractions in supine which patient completes well following initial cueing and demonstration. Patient given cueing throughout session for posture and head positioning. Patient stating improvement in symptoms at end of session. Patient will continue to benefit from skilled physical therapy in order to reduce impairment and improve function.    Personal Factors and Comorbidities Comorbidity 1;Comorbidity 2    Comorbidities DB, HTN    Examination-Activity Limitations Lift;Reach Overhead;Carry;Sleep    Examination-Participation Restrictions Driving;Cleaning;Yard Work    Conservation officer, historic buildings Evolving/Moderate complexity    Rehab Potential Fair    PT Frequency 1x / week    PT Duration 8 weeks    PT Treatment/Interventions ADLs/Self Care Home Management;Cryotherapy;Electrical Stimulation;Traction;Moist Heat;Therapeutic exercise;Manual techniques;Therapeutic activities;Patient/family education;Neuromuscular re-education;Passive range of motion;Dry needling    PT Next Visit Plan posture, trial traction, STM as needed for pain, cervical/shoulder ROM, pelvic tilts/core strengthening, focus on HEP development - DN if indciated    PT Home Exercise Plan on active seated posture throughout the day    Consulted and Agree with Plan of Care Patient             Patient will benefit from skilled therapeutic intervention in order to improve the following deficits and impairments:   Pain, Decreased strength, Decreased activity tolerance, Decreased range of motion, Improper body mechanics, Postural dysfunction, Hypomobility  Visit Diagnosis: Cervicalgia  Decreased range of motion of neck     Problem List Patient Active Problem List   Diagnosis Date Noted   Rectal itching 12/10/2018   Rectal pain 06/19/2018   Hemorrhoids 05/14/2017   Constipation 05/14/2017   Rhabdomyolysis 12/27/2016   AKI (acute kidney injury) (HCC) 12/27/2016   Fatty liver 12/05/2012   Rectal bleeding 12/05/2012   Gross hematuria 12/05/2012   Arthritis, 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

## 2020-11-11 NOTE — Patient Instructions (Signed)
Access Code: [email or web address] URL: [email or web address] Date: 11/11/2020 Prepared by: Greig Castilla Maximos Zayas  Exercises Supine Chin Tuck - 2 x daily - 7 x weekly - 3 sets - 10 reps - 2 second hold Seated Scapular Retraction - 2 x daily - 7 x weekly - 3 sets - 10 reps

## 2020-11-18 ENCOUNTER — Encounter (HOSPITAL_COMMUNITY): Payer: Self-pay

## 2020-11-18 ENCOUNTER — Other Ambulatory Visit: Payer: Self-pay

## 2020-11-18 ENCOUNTER — Ambulatory Visit (HOSPITAL_COMMUNITY): Payer: Medicaid Other

## 2020-11-18 DIAGNOSIS — R29898 Other symptoms and signs involving the musculoskeletal system: Secondary | ICD-10-CM

## 2020-11-18 DIAGNOSIS — M542 Cervicalgia: Secondary | ICD-10-CM

## 2020-11-18 NOTE — Therapy (Signed)
[facility] [address] [place], [place], [address] Phone: [phone]   Fax:  [phone]  Physical Therapy Treatment  Patient Details  Name: Daniel Nicholson MRN: [id number] Date of Birth: 02-Jul-1970 Referring Provider (PT): Maylon Cos FNP   Encounter Date: 11/18/2020   PT End of Session - 11/18/20 1602     Visit Number 3    Number of Visits 8    Date for PT Re-Evaluation 12/23/20    Authorization Type medicaid traditional    Authorization Time Period 3 visits approved 7/25- 8/14    Authorization - Visit Number 2    Authorization - Number of Visits 3    PT Start Time 1536    PT Stop Time 1614    PT Time Calculation (min) 38 min    Activity Tolerance Patient tolerated treatment well    Behavior During Therapy [facility] for tasks assessed/performed             Past Medical History:  Diagnosis Date   Acid reflux    Diabetes mellitus without complication (HCC)    Gout    Hemorrhoids    s/p right posterior, left lateral, right anterior banding. Last banding procedure Jan 2015.    Hx of anxiety disorder    Hypertension    Neck pain    Poor circulation    S/P tooth extraction 08/16/2016   three teeth extracted   Tremors of nervous system     Past Surgical History:  Procedure Laterality Date   COLONOSCOPY N/A 12/16/2012   Dr. Jena Gauss- internal hemorrhoids otherwise normal. Screening in 2024.    COLONOSCOPY N/A 07/11/2017   Dr. Jena Gauss: Nonbleeding grade 1 internal hemorrhoids   FLEXIBLE SIGMOIDOSCOPY N/A 03/26/2019   Procedure: FLEXIBLE SIGMOIDOSCOPY;  Surgeon: Corbin Ade, MD;  [facility];  Service: Endoscopy;  Laterality: N/A;  7:30am   HEMORRHOID BANDING     None      There were no vitals filed for this visit.   Subjective Assessment - 11/18/20 1538     Subjective Pt stated his neck is feeling good today, looking down is making improvements.  Neck feels a little stiff today.    Currently in Pain?  No/denies                [facility] PT Assessment - 11/18/20 0001       Assessment   Medical Diagnosis neck pain    Referring Provider (PT) Kimberlt Meyran FNP    Next MD Visit 3 months      AROM   AROM Assessment Site Cervical;Shoulder    Right Shoulder Flexion 132 Degrees   was 120   Right Shoulder ABduction 140 Degrees   was 95   Right Shoulder Internal Rotation --   Reached L2 no pain   Right Shoulder External Rotation --   rea   Left Shoulder Flexion 130 Degrees   was 120   Left Shoulder ABduction 132 Degrees   was 95   Left Shoulder Internal Rotation --   Reached T12 no pain   Cervical Extension 18   was 12   Cervical - Right Side Bend 35   was 34   Cervical - Left Side Bend 30   was 24   Cervical - Right Rotation 73   was 72   Cervical - Left Rotation 74   was 65  [facility] Adult PT Treatment/Exercise - 11/18/20 0001       Posture/Postural Control   Posture/Postural Control Postural limitations    Postural Limitations Rounded Shoulders;Forward head;Increased thoracic kyphosis;Posterior pelvic tilt;Flexed trunk      Exercises   Exercises Neck      Neck Exercises: Standing   Other Standing Exercises cervical retraction isometric at wall with towel 5-10 second holds 2x5    Other Standing Exercises Cervical retraction with UE flexion      Neck Exercises: Seated   W Back 10 reps    W Back Limitations cueing for head neutral to reduce forward head during exercise    Other Seated Exercise scap retraction 15x 3"    Other Seated Exercise 3D cervical excursion 10x; ab set 10x 5" paired with exhale; 3D thoracic excursion                                         PT Education - 11/18/20 1545     Education Details Educated pt on importance of posture to reduce strain on posterior neck    Person(s) Educated Patient    Methods Explanation;Demonstration;Tactile cues;Verbal cues    Comprehension Verbalized  understanding;Returned demonstration;Need further instruction              PT Short Term Goals - 11/18/20 1548       PT SHORT TERM GOAL #1   Title Patient will be independent in self management strategies to improve quality of life and functional outcomes.    Baseline 8/11:  Reports compliance wiht HEP 2x/ daily    Status Achieved      PT SHORT TERM GOAL #2   Title Patient will report at least 25% improvement in overall symptoms and/or function to demonstrate improved functional mobility    Baseline 11/18/20:  Reports improvements by 5% wiht decreased pain    Status On-going      PT SHORT TERM GOAL #3   Title Patient will be able to demonstrate painfree cervical ROM    Baseline 11/18/20: see ROM, able to complete in painfree range, does report feeling stretch.    Status On-going               PT Long Term Goals - 11/18/20 1559       PT LONG TERM GOAL #1   Title Patient will be able to demonstrate at least 105 degrees of shoulder abduction to demonstrate improved shoulder mobility    Baseline 11/18/20: see ROM    Status Achieved      PT LONG TERM GOAL #2   Title Patient will be able to demonstrate good seated posture for at least 2 minutes without cues from PT to demonstrate improved sitting posture    Baseline 11/18/20:  Multimodal cueing through session to reduce forward head position      PT LONG TERM GOAL #3   Title Patient will report at least 50% improvement in overall symptoms and/or function to demonstrate improved functional mobility    Baseline 11/18/20:  Reports 5% improvement    Status On-going                   Plan - 11/18/20 1603     Clinical Impression Statement Session focus with cervical mobility and postural strengthening.  Pt presents with vast improvements with cervical and BUE mobility (see ROM measurements.)  Pt with frequent reminders  to address forward head and rolled shoulder though session.  Additional postural strengthening  exercises complete with min cueing initially then able to demonstrate appropriate mechanics and no reports of pain through session.    Personal Factors and Comorbidities Comorbidity 1;Comorbidity 2    Comorbidities DB, HTN    Examination-Activity Limitations Lift;Reach Overhead;Carry;Sleep    Examination-Participation Restrictions Driving;Cleaning;Yard Work    Conservation officer, historic buildings Evolving/Moderate complexity    Clinical Decision Making Moderate    Rehab Potential Fair    PT Frequency 1x / week    PT Duration 8 weeks    PT Treatment/Interventions ADLs/Self Care Home Management;Cryotherapy;Electrical Stimulation;Traction;Moist Heat;Therapeutic exercise;Manual techniques;Therapeutic activities;Patient/family education;Neuromuscular re-education;Passive range of motion;Dry needling    PT Next Visit Plan posture, trial traction, STM as needed for pain, cervical/shoulder ROM, pelvic tilts/core strengthening, focus on HEP development - DN if indciated    PT Home Exercise Plan on active seated posture throughout the day    Consulted and Agree with Plan of Care Patient             Patient will benefit from skilled therapeutic intervention in order to improve the following deficits and impairments:  Pain, Decreased strength, Decreased activity tolerance, Decreased range of motion, Improper body mechanics, Postural dysfunction, Hypomobility  Visit Diagnosis: Cervicalgia  Decreased range of motion of neck     Problem List Patient Active Problem List   Diagnosis Date Noted   Rectal itching 12/10/2018   Rectal pain 06/19/2018   Hemorrhoids 05/14/2017   Constipation 05/14/2017   Rhabdomyolysis 12/27/2016   AKI (acute kidney injury) (HCC) 12/27/2016   Fatty liver 12/05/2012   Rectal bleeding 12/05/2012   Gross hematuria 12/05/2012   Arthritis, wrist 01/10/2011   Acid reflux    Gout    FOOT PAIN 01/30/2008   Becky Sax, LPTA/CLT; CBIS [phone]  Juel Burrow 11/19/2020, 8:18 AM  [facility] [address] [place], [place], [address] Phone: [phone]   Fax:  [phone]  Name: Daniel Nicholson MRN: [id number] Date of Birth: [birth year]

## 2020-11-25 ENCOUNTER — Other Ambulatory Visit: Payer: Self-pay

## 2020-11-25 ENCOUNTER — Ambulatory Visit (HOSPITAL_COMMUNITY): Payer: Medicaid Other | Admitting: Physical Therapy

## 2020-11-25 ENCOUNTER — Encounter (HOSPITAL_COMMUNITY): Payer: Self-pay | Admitting: Physical Therapy

## 2020-11-25 DIAGNOSIS — M542 Cervicalgia: Secondary | ICD-10-CM | POA: Diagnosis not present

## 2020-11-25 DIAGNOSIS — R29898 Other symptoms and signs involving the musculoskeletal system: Secondary | ICD-10-CM

## 2020-11-25 NOTE — Therapy (Signed)
[facility] [address] [place], [place], [address] Phone: [phone]   Fax:  [phone]  Physical Therapy Treatment  Patient Details  Name: Daniel Nicholson MRN: [id number] Date of Birth: 04-Jan-1971 Referring Provider (PT): Maylon Cos FNP   Encounter Date: 11/25/2020   PT End of Session - 11/25/20 1136     Visit Number 4    Number of Visits 8    Date for PT Re-Evaluation 12/23/20    Authorization Type medicaid traditional    Authorization Time Period 3 visits approved 7/25- 8/14; 6 visits approved 8/15-9-25    Authorization - Visit Number 1    Authorization - Number of Visits 6    PT Start Time 1137    PT Stop Time 1215    PT Time Calculation (min) 38 min    Activity Tolerance Patient tolerated treatment well    Behavior During Therapy [facility] for tasks assessed/performed             Past Medical History:  Diagnosis Date   Acid reflux    Diabetes mellitus without complication (HCC)    Gout    Hemorrhoids    s/p right posterior, left lateral, right anterior banding. Last banding procedure Jan 2015.    Hx of anxiety disorder    Hypertension    Neck pain    Poor circulation    S/P tooth extraction 08/16/2016   three teeth extracted   Tremors of nervous system     Past Surgical History:  Procedure Laterality Date   COLONOSCOPY N/A 12/16/2012   Dr. Jena Gauss- internal hemorrhoids otherwise normal. Screening in 2024.    COLONOSCOPY N/A 07/11/2017   Dr. Jena Gauss: Nonbleeding grade 1 internal hemorrhoids   FLEXIBLE SIGMOIDOSCOPY N/A 03/26/2019   Procedure: FLEXIBLE SIGMOIDOSCOPY;  Surgeon: Corbin Ade, MD;  [facility];  Service: Endoscopy;  Laterality: N/A;  7:30am   HEMORRHOID BANDING     None      There were no vitals filed for this visit.   Subjective Assessment - 11/25/20 1137     Subjective Patient states neck is feeling alright. He still has a little bit of trouble but he doesnt hurt like he was.  HEP is going alright.    Currently in Pain? No/denies                               [facility] Adult PT Treatment/Exercise - 11/25/20 0001       Neck Exercises: Theraband   Shoulder Extension 10 reps;Green    Shoulder Extension Limitations 3 sets    Rows 10 reps;Green    Rows Limitations 3 sets      Neck Exercises: Standing   Other Standing Exercises cervical retraction isometric at wall with towel 5-10 second holds 3x5    Other Standing Exercises shoulder flexion with band between hands, horizontal abduction, scapular retraction with GH ER, PNF d2 pattern: green band 2x 10 each                    PT Education - 11/25/20 1137     Education Details HEP, posture    Person(s) Educated Patient    Methods Explanation;Demonstration    Comprehension Verbalized understanding;Returned demonstration              PT Short Term Goals - 11/18/20 1548       PT SHORT TERM GOAL #1  Title Patient will be independent in self management strategies to improve quality of life and functional outcomes.    Baseline 8/11:  Reports compliance wiht HEP 2x/ daily    Status Achieved      PT SHORT TERM GOAL #2   Title Patient will report at least 25% improvement in overall symptoms and/or function to demonstrate improved functional mobility    Baseline 11/18/20:  Reports improvements by 5% wiht decreased pain    Status On-going      PT SHORT TERM GOAL #3   Title Patient will be able to demonstrate painfree cervical ROM    Baseline 11/18/20: see ROM, able to complete in painfree range, does report feeling stretch.    Status On-going               PT Long Term Goals - 11/18/20 1559       PT LONG TERM GOAL #1   Title Patient will be able to demonstrate at least 105 degrees of shoulder abduction to demonstrate improved shoulder mobility    Baseline 11/18/20: see ROM    Status Achieved      PT LONG TERM GOAL #2   Title Patient will be able to demonstrate good  seated posture for at least 2 minutes without cues from PT to demonstrate improved sitting posture    Baseline 11/18/20:  Multimodal cueing through session to reduce forward head position      PT LONG TERM GOAL #3   Title Patient will report at least 50% improvement in overall symptoms and/or function to demonstrate improved functional mobility    Baseline 11/18/20:  Reports 5% improvement    Status On-going                   Plan - 11/25/20 1137     Clinical Impression Statement Patient progressing well with PT and will likely d/c at end of POC. Patient requires min/no cueing for proper mechanics of previously performed exercises. Began previous decompression series exercises in standing for improved postural and periscapular strengthening. Patient requires intermittent cueing for posture throughout session. Continued to address cervical ROM and posture today which is improving compared to prior sessions.    Personal Factors and Comorbidities Comorbidity 1;Comorbidity 2    Comorbidities DB, HTN    Examination-Activity Limitations Lift;Reach Overhead;Carry;Sleep    Examination-Participation Restrictions Driving;Cleaning;Yard Work    Conservation officer, historic buildings Evolving/Moderate complexity    Rehab Potential Fair    PT Frequency 1x / week    PT Duration 8 weeks    PT Treatment/Interventions ADLs/Self Care Home Management;Cryotherapy;Electrical Stimulation;Traction;Moist Heat;Therapeutic exercise;Manual techniques;Therapeutic activities;Patient/family education;Neuromuscular re-education;Passive range of motion;Dry needling    PT Next Visit Plan posture, trial traction, STM as needed for pain, cervical/shoulder ROM, pelvic tilts/core strengthening, focus on HEP development - DN if indciated    PT Home Exercise Plan on active seated posture throughout the day; shoulder flexion with band between hands, horizontal abduction, scapular retraction with GH ER, PNF d2 pattern    Consulted  and Agree with Plan of Care Patient             Patient will benefit from skilled therapeutic intervention in order to improve the following deficits and impairments:  Pain, Decreased strength, Decreased activity tolerance, Decreased range of motion, Improper body mechanics, Postural dysfunction, Hypomobility  Visit Diagnosis: Cervicalgia  Decreased range of motion of neck     Problem List Patient Active Problem List   Diagnosis Date Noted   Rectal itching  12/10/2018   Rectal pain 06/19/2018   Hemorrhoids 05/14/2017   Constipation 05/14/2017   Rhabdomyolysis 12/27/2016   AKI (acute kidney injury) (HCC) 12/27/2016   Fatty liver 12/05/2012   Rectal bleeding 12/05/2012   Gross hematuria 12/05/2012   Arthritis, wrist 01/10/2011   Acid reflux    Gout    FOOT PAIN 01/30/2008    12:10 PM, 11/25/20 Wyman Songster PT, DPT Physical Therapist at [facility]   [facility] [address] [place], [place], [address] Phone: [phone]   Fax:  [phone]  Name: AWAD GLADD MRN: [id number] Date of Birth: March 25, 1971

## 2020-11-25 NOTE — Patient Instructions (Signed)
Access Code: [email or web address] URL: [email or web address] Date: 11/25/2020 Prepared by: Greig Castilla Laksh Hinners  Exercises Standing Shoulder Flexion Full Range - 1 x daily - 7 x weekly - 2 sets - 10 reps Shoulder External Rotation and Scapular Retraction with Resistance - 1 x daily - 7 x weekly - 2 sets - 10 reps Standing Shoulder Horizontal Abduction with Resistance - 1 x daily - 7 x weekly - 2 sets - 10 reps Standing Shoulder Single Arm PNF D2 Flexion with Resistance - 1 x daily - 7 x weekly - 2 sets - 10 reps

## 2020-12-01 ENCOUNTER — Other Ambulatory Visit: Payer: Self-pay | Admitting: Internal Medicine

## 2020-12-02 ENCOUNTER — Encounter (HOSPITAL_COMMUNITY): Payer: Medicaid Other | Admitting: Physical Therapy

## 2020-12-03 ENCOUNTER — Ambulatory Visit (HOSPITAL_COMMUNITY): Payer: Medicaid Other

## 2020-12-03 ENCOUNTER — Encounter (HOSPITAL_COMMUNITY): Payer: Self-pay

## 2020-12-03 ENCOUNTER — Other Ambulatory Visit: Payer: Self-pay

## 2020-12-03 DIAGNOSIS — M542 Cervicalgia: Secondary | ICD-10-CM

## 2020-12-03 DIAGNOSIS — R29898 Other symptoms and signs involving the musculoskeletal system: Secondary | ICD-10-CM

## 2020-12-03 NOTE — Therapy (Signed)
[facility] [address] [place], [place], [address] Phone: [phone]   Fax:  [phone]  Physical Therapy Treatment  Patient Details  Name: Daniel Nicholson MRN: [id number] Date of Birth: 1970/04/23 Referring Provider (PT): Maylon Cos FNP   Encounter Date: 12/03/2020   PT End of Session - 12/03/20 1056     Visit Number 5    Number of Visits 8    Date for PT Re-Evaluation 12/23/20    Authorization Type medicaid traditional    Authorization Time Period 3 visits approved 7/25- 8/14; 6 visits approved 8/15-9-25    Authorization - Visit Number 2    Authorization - Number of Visits 6    Progress Note Due on Visit 9    PT Start Time 1047    PT Stop Time 1127    PT Time Calculation (min) 40 min    Activity Tolerance Patient tolerated treatment well    Behavior During Therapy [facility] for tasks assessed/performed             Past Medical History:  Diagnosis Date   Acid reflux    Diabetes mellitus without complication (HCC)    Gout    Hemorrhoids    s/p right posterior, left lateral, right anterior banding. Last banding procedure Jan 2015.    Hx of anxiety disorder    Hypertension    Neck pain    Poor circulation    S/P tooth extraction 08/16/2016   three teeth extracted   Tremors of nervous system     Past Surgical History:  Procedure Laterality Date   COLONOSCOPY N/A 12/16/2012   Dr. Jena Gauss- internal hemorrhoids otherwise normal. Screening in 2024.    COLONOSCOPY N/A 07/11/2017   Dr. Jena Gauss: Nonbleeding grade 1 internal hemorrhoids   FLEXIBLE SIGMOIDOSCOPY N/A 03/26/2019   Procedure: FLEXIBLE SIGMOIDOSCOPY;  Surgeon: Corbin Ade, MD;  [facility];  Service: Endoscopy;  Laterality: N/A;  7:30am   HEMORRHOID BANDING     None      There were no vitals filed for this visit.   Subjective Assessment - 12/03/20 1054     Subjective No reports of pain, neck feels stiff looking down.    Currently in Pain?  No/denies    Pain Descriptors / Indicators Tightness                               [facility] Adult PT Treatment/Exercise - 12/03/20 0001       Posture/Postural Control   Posture/Postural Control Postural limitations    Postural Limitations Forward head;Rounded Shoulders      Exercises   Exercises Neck      Neck Exercises: Theraband   Shoulder Extension 10 reps;Green    Shoulder Extension Limitations 2 sets    Rows 10 reps;Green    Rows Limitations 2 sets      Neck Exercises: Standing   Neck Retraction 10 reps    Neck Retraction Limitations against wall    Upper Extremity Flexion with Stabilization Flexion;10 reps    UE Flexion with Stabilization Limitations with chin tuck      Neck Exercises: Seated   W Back 10 reps    W Back Limitations cueing for head neutral to reduce forward head during exercise    Other Seated Exercise posterior pelvic tilt    Other Seated Exercise 3D thoracic excursion      Neck Exercises: Supine  Other Supine Exercise posterior pelvic tilit                      PT Short Term Goals - 11/18/20 1548       PT SHORT TERM GOAL #1   Title Patient will be independent in self management strategies to improve quality of life and functional outcomes.    Baseline 8/11:  Reports compliance wiht HEP 2x/ daily    Status Achieved      PT SHORT TERM GOAL #2   Title Patient will report at least 25% improvement in overall symptoms and/or function to demonstrate improved functional mobility    Baseline 11/18/20:  Reports improvements by 5% wiht decreased pain    Status On-going      PT SHORT TERM GOAL #3   Title Patient will be able to demonstrate painfree cervical ROM    Baseline 11/18/20: see ROM, able to complete in painfree range, does report feeling stretch.    Status On-going               PT Long Term Goals - 11/18/20 1559       PT LONG TERM GOAL #1   Title Patient will be able to demonstrate at least 105 degrees  of shoulder abduction to demonstrate improved shoulder mobility    Baseline 11/18/20: see ROM    Status Achieved      PT LONG TERM GOAL #2   Title Patient will be able to demonstrate good seated posture for at least 2 minutes without cues from PT to demonstrate improved sitting posture    Baseline 11/18/20:  Multimodal cueing through session to reduce forward head position      PT LONG TERM GOAL #3   Title Patient will report at least 50% improvement in overall symptoms and/or function to demonstrate improved functional mobility    Baseline 11/18/20:  Reports 5% improvement    Status On-going                   Plan - 12/03/20 1115     Clinical Impression Statement Pt with increased lordacic curvature during cervical retraction, educated importance of spinal alignment,abdominal strengthening and posture for pain control.  Added abdominal strengthening exercises.  Cotninues to address cervical and thoracic mobility and postural strengthening.  Pt with difficulty with motor control with abdominal strengthening, breathing and movement required verbal and tactile cueing.    Personal Factors and Comorbidities Comorbidity 1;Comorbidity 2    Comorbidities DB, HTN    Examination-Activity Limitations Lift;Reach Overhead;Carry;Sleep    Examination-Participation Restrictions Driving;Cleaning;Yard Work    Conservation officer, historic buildings Evolving/Moderate complexity    Clinical Decision Making Moderate    Rehab Potential Fair    PT Frequency 1x / week    PT Duration 8 weeks    PT Treatment/Interventions ADLs/Self Care Home Management;Cryotherapy;Electrical Stimulation;Traction;Moist Heat;Therapeutic exercise;Manual techniques;Therapeutic activities;Patient/family education;Neuromuscular re-education;Passive range of motion;Dry needling    PT Next Visit Plan Continue posture, pelvic tilt/ core strengthening, cervical/shoulder ROM, STM PRN for pain    PT Home Exercise Plan on active seated  posture throughout the day; shoulder flexion with band between hands, horizontal abduction, scapular retraction with GH ER, PNF d2 pattern; 8/26: pevlic tilt    Consulted and Agree with Plan of Care Patient             Patient will benefit from skilled therapeutic intervention in order to improve the following deficits and impairments:  Pain, Decreased strength, Decreased activity  tolerance, Decreased range of motion, Improper body mechanics, Postural dysfunction, Hypomobility  Visit Diagnosis: Cervicalgia  Decreased range of motion of neck     Problem List Patient Active Problem List   Diagnosis Date Noted   Rectal itching 12/10/2018   Rectal pain 06/19/2018   Hemorrhoids 05/14/2017   Constipation 05/14/2017   Rhabdomyolysis 12/27/2016   AKI (acute kidney injury) (HCC) 12/27/2016   Fatty liver 12/05/2012   Rectal bleeding 12/05/2012   Gross hematuria 12/05/2012   Arthritis, wrist 01/10/2011   Acid reflux    Gout    FOOT PAIN 01/30/2008   Becky Sax, LPTA/CLT; CBIS [phone]  Juel Burrow 12/03/2020, 1:09 PM  [facility] [address] [place], [place], [address] Phone: [phone]   Fax:  [phone]  Name: Daniel Nicholson MRN: [id number] Date of Birth: 1971/02/04

## 2020-12-06 ENCOUNTER — Telehealth: Payer: Self-pay | Admitting: Internal Medicine

## 2020-12-06 MED ORDER — HYDROCORTISONE (PERIANAL) 2.5 % EX CREA: 1.0000 "application " | TOPICAL_CREAM | Freq: Two times a day (BID) | CUTANEOUS | 1 refills | Status: DC

## 2020-12-06 NOTE — Addendum Note (Signed)
Addended by: Tiffany Kocher on: 12/06/2020 01:30 PM   Modules accepted: Orders

## 2020-12-06 NOTE — Telephone Encounter (Signed)
Pt is requesting a refill on Anusol 2.5% rectal cream to be sent to [facility].

## 2020-12-06 NOTE — Telephone Encounter (Signed)
Pt called asking if we would call in another prescription of Ansul 2.5% rectal cream to [facility].

## 2020-12-09 ENCOUNTER — Other Ambulatory Visit: Payer: Self-pay

## 2020-12-09 ENCOUNTER — Encounter (HOSPITAL_COMMUNITY): Payer: Self-pay | Admitting: Physical Therapy

## 2020-12-09 ENCOUNTER — Ambulatory Visit (HOSPITAL_COMMUNITY): Payer: Medicaid Other | Attending: Student | Admitting: Physical Therapy

## 2020-12-09 DIAGNOSIS — R29898 Other symptoms and signs involving the musculoskeletal system: Secondary | ICD-10-CM | POA: Diagnosis present

## 2020-12-09 DIAGNOSIS — M542 Cervicalgia: Secondary | ICD-10-CM | POA: Diagnosis not present

## 2020-12-09 NOTE — Therapy (Signed)
[facility] [address] [place], [place], [address] Phone: [phone]   Fax:  [phone]  Physical Therapy Treatment and Discharge Note  Patient Details  Name: Daniel Nicholson MRN: [id number] Date of Birth: [birth year] Referring Provider (PT): Kimberlt Meyran FNP  PHYSICAL THERAPY DISCHARGE SUMMARY  Visits from Start of Care: 6  Current functional level related to goals / functional outcomes: See below   Remaining deficits: See below   Education / Equipment: See below   Patient agrees to discharge. Patient goals were met. Patient is being discharged due to meeting the stated rehab goals.   Encounter Date: 12/09/2020   PT End of Session - 12/09/20 1046     Visit Number 6    Number of Visits 8    Date for PT Re-Evaluation 12/23/20    Authorization Type medicaid traditional    Authorization Time Period 3 visits approved 7/25- 8/14; 6 visits approved 8/15-9-25    Authorization - Visit Number 3    Authorization - Number of Visits 6    Progress Note Due on Visit 9    PT Start Time 4315    PT Stop Time 1118    PT Time Calculation (min) 31 min    Activity Tolerance Patient tolerated treatment well    Behavior During Therapy [facility] for tasks assessed/performed             Past Medical History:  Diagnosis Date   Acid reflux    Diabetes mellitus without complication ([place])    Gout    Hemorrhoids    s/p right posterior, left lateral, right anterior banding. Last banding procedure Jan 2015.    Hx of anxiety disorder    Hypertension    Neck pain    Poor circulation    S/P tooth extraction 08/16/2016   three teeth extracted   Tremors of nervous system     Past Surgical History:  Procedure Laterality Date   COLONOSCOPY N/A 12/16/2012   Dr. Gala Romney- internal hemorrhoids otherwise normal. Screening in 2024.    COLONOSCOPY N/A 07/11/2017   Dr. Gala Romney: Nonbleeding grade 1 internal hemorrhoids   FLEXIBLE SIGMOIDOSCOPY N/A  03/26/2019   Procedure: FLEXIBLE SIGMOIDOSCOPY;  Surgeon: Daneil Dolin, MD;  [facility];  Service: Endoscopy;  Laterality: N/A;  7:30am   HEMORRHOID BANDING     None      There were no vitals filed for this visit.       [facility] PT Assessment - 12/09/20 0001       Assessment   Medical Diagnosis neck pain    Referring Provider (PT) Kimberlt Meyran FNP      AROM   Right Shoulder Flexion 165 Degrees    Right Shoulder ABduction 170 Degrees    Left Shoulder Flexion 150 Degrees    Left Shoulder ABduction 170 Degrees    Cervical Flexion --   no symptoms, WNL   Cervical Extension 30   no symptoms   Cervical - Right Side Bend 33   no symptoms   Cervical - Left Side Bend 32   no symptoms   Cervical - Right Rotation 78   no symptoms   Cervical - Left Rotation 75   no symptoms                          [facility] Adult PT Treatment/Exercise - 12/09/20 0001       Neck Exercises: Standing  Other Standing Exercises SHOULDER EXTENSION 2X10 10" HOLDS WITH TOWEL      Neck Exercises: Seated   Other Seated Exercise chin tucks with flexion adn extension 4x5                    PT Education - 12/09/20 1118     Education Details answered all qurestions about HEP, surgery, posture, goals    Person(s) Educated Patient    Methods Explanation    Comprehension Verbalized understanding              PT Short Term Goals - 12/09/20 1054       PT SHORT TERM GOAL #1   Title Patient will be independent in self management strategies to improve quality of life and functional outcomes.    Baseline 8/11:  Reports compliance wiht HEP 2x/ daily    Status Achieved      PT SHORT TERM GOAL #2   Title Patient will report at least 25% improvement in overall symptoms and/or function to demonstrate improved functional mobility    Baseline 89% better    Status Achieved      PT SHORT TERM GOAL #3   Title Patient will be able to demonstrate painfree cervical ROM     Baseline --    Status Achieved               PT Long Term Goals - 12/09/20 1058       PT LONG TERM GOAL #1   Title Patient will be able to demonstrate at least 105 degrees of shoulder abduction to demonstrate improved shoulder mobility    Status Achieved      PT LONG TERM GOAL #2   Title Patient will be able to demonstrate good seated posture for at least 2 minutes without cues from PT to demonstrate improved sitting posture    Baseline --    Status Achieved      PT LONG TERM GOAL #3   Title Patient will report at least 50% improvement in overall symptoms and/or function to demonstrate improved functional mobility    Baseline --    Status Achieved                   Plan - 12/09/20 1120     Clinical Impression Statement Patient has met all goals at this time. Reviewed home exercises and new exercises provided today. Answered all questions and patient to discharge from PT to HEP at this time secondary to progress made while in therapy.    Personal Factors and Comorbidities Comorbidity 1;Comorbidity 2    Comorbidities DB, HTN    Examination-Activity Limitations Lift;Reach Overhead;Carry;Sleep    Examination-Participation Restrictions Driving;Cleaning;Yard Work    Merchant [employer] officer Evolving/Moderate complexity    Rehab Potential Fair    PT Frequency 1x / week    PT Duration 8 weeks    PT Treatment/Interventions ADLs/Self Care Home Management;Cryotherapy;Electrical Stimulation;Traction;Moist Heat;Therapeutic exercise;Manual techniques;Therapeutic activities;Patient/family education;Neuromuscular re-education;Passive range of motion;Dry needling    PT Next Visit Plan DC to HEP    PT Home Exercise Plan on active seated posture throughout the day; shoulder flexion with band between hands, horizontal abduction, scapular retraction with GH ER, PNF d2 pattern; 4/70: pevlic tilt; 9/1 shoulder extension stretch, chin tuck Flexion/extension    Consulted and  Agree with Plan of Care Patient             Patient will benefit from skilled therapeutic intervention in order to improve  the following deficits and impairments:  Pain, Decreased strength, Decreased activity tolerance, Decreased range of motion, Improper body mechanics, Postural dysfunction, Hypomobility  Visit Diagnosis: Cervicalgia  Decreased range of motion of neck     Problem List Patient Active Problem List   Diagnosis Date Noted   Rectal itching 12/10/2018   Rectal pain 06/19/2018   Hemorrhoids 05/14/2017   Constipation 05/14/2017   Rhabdomyolysis 12/27/2016   AKI (acute kidney injury) ([place]) 12/27/2016   Fatty liver 12/05/2012   Rectal bleeding 12/05/2012   Gross hematuria 12/05/2012   Arthritis, wrist 01/10/2011   Acid reflux    Gout    FOOT PAIN 01/30/2008   11:21 AM, 12/09/20 Jerene Pitch, DPT Physical Therapy with [facility]  [phone] office   [place], [place], [address] Phone: [phone]   Fax:  [phone]  Name: Daniel Nicholson MRN: [id number] Date of Birth: [birth year]

## 2020-12-16 ENCOUNTER — Encounter (HOSPITAL_COMMUNITY): Payer: Medicaid Other | Admitting: Physical Therapy

## 2020-12-23 ENCOUNTER — Encounter (HOSPITAL_COMMUNITY): Payer: Medicaid Other | Admitting: Physical Therapy

## 2021-03-02 ENCOUNTER — Telehealth: Payer: Self-pay | Admitting: *Deleted

## 2021-03-02 NOTE — Telephone Encounter (Signed)
Received call from patient.   Requested medication refill on "APAP 800mg " as he is having dental pain but can not be seen until Jan 2023.  Advised to contact PCP for further recommendations.

## 2021-03-08 ENCOUNTER — Other Ambulatory Visit: Payer: Self-pay

## 2021-03-08 ENCOUNTER — Emergency Department (HOSPITAL_COMMUNITY): Payer: Medicaid Other

## 2021-03-08 ENCOUNTER — Encounter (HOSPITAL_COMMUNITY): Payer: Self-pay | Admitting: *Deleted

## 2021-03-08 ENCOUNTER — Emergency Department (HOSPITAL_COMMUNITY)
Admission: EM | Admit: 2021-03-08 | Discharge: 2021-03-08 | Disposition: A | Discharge status: Home / Self Care | Payer: Medicaid Other | Attending: Emergency Medicine | Admitting: Emergency Medicine

## 2021-03-08 DIAGNOSIS — J209 Acute bronchitis, unspecified: Secondary | ICD-10-CM

## 2021-03-08 DIAGNOSIS — Z7984 Long term (current) use of oral hypoglycemic drugs: Secondary | ICD-10-CM | POA: Insufficient documentation

## 2021-03-08 DIAGNOSIS — I10 Essential (primary) hypertension: Secondary | ICD-10-CM | POA: Diagnosis not present

## 2021-03-08 DIAGNOSIS — Z794 Long term (current) use of insulin: Secondary | ICD-10-CM | POA: Insufficient documentation

## 2021-03-08 DIAGNOSIS — E119 Type 2 diabetes mellitus without complications: Secondary | ICD-10-CM | POA: Diagnosis not present

## 2021-03-08 DIAGNOSIS — F17210 Nicotine dependence, cigarettes, uncomplicated: Secondary | ICD-10-CM | POA: Insufficient documentation

## 2021-03-08 DIAGNOSIS — R059 Cough, unspecified: Secondary | ICD-10-CM | POA: Diagnosis present

## 2021-03-08 DIAGNOSIS — Z20822 Contact with and (suspected) exposure to covid-19: Secondary | ICD-10-CM | POA: Diagnosis not present

## 2021-03-08 DIAGNOSIS — Z79899 Other long term (current) drug therapy: Secondary | ICD-10-CM | POA: Insufficient documentation

## 2021-03-08 LAB — RESP PANEL BY RT-PCR (FLU A&B, COVID) ARPGX2
Influenza A by PCR: NEGATIVE
Influenza B by PCR: NEGATIVE
SARS Coronavirus 2 by RT PCR: NEGATIVE

## 2021-03-08 MED ORDER — PREDNISONE 50 MG PO TABS: ORAL_TABLET | ORAL | 0 refills | Status: DC

## 2021-03-08 MED ORDER — ALBUTEROL SULFATE HFA 108 (90 BASE) MCG/ACT IN AERS
2.0000 | INHALATION_SPRAY | Freq: Once | RESPIRATORY_TRACT | Status: AC
  Administered 2021-03-08: 2 via RESPIRATORY_TRACT

## 2021-03-08 MED ORDER — ALBUTEROL SULFATE HFA 108 (90 BASE) MCG/ACT IN AERS: 2.0000 | INHALATION_SPRAY | Freq: Four times a day (QID) | RESPIRATORY_TRACT | 2 refills | Status: AC | PRN

## 2021-03-08 NOTE — ED Provider Notes (Signed)
[facility] EMERGENCY DEPARTMENT Provider Note   CSN: [id number] Arrival date & time: 03/08/21  9030     History Chief Complaint  Patient presents with   Cough    Daniel Nicholson is a 50 y.o. male.  The history is provided by the patient. No language interpreter was used.  Cough Cough characteristics:  Non-productive Sputum characteristics:  Nondescript Severity:  Moderate Onset quality:  Gradual Timing:  Constant Progression:  Worsening Chronicity:  New Smoker: no   Relieved by:  Nothing Worsened by:  Nothing Ineffective treatments:  None tried Associated symptoms: no rhinorrhea       Past Medical History:  Diagnosis Date   Acid reflux    Diabetes mellitus without complication (HCC)    Gout    Hemorrhoids    s/p right posterior, left lateral, right anterior banding. Last banding procedure Jan 2015.    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 Jena Gauss- internal hemorrhoids otherwise normal. Screening in 2024.    COLONOSCOPY N/A 07/11/2017   Dr. Jena Gauss: Nonbleeding grade 1 internal hemorrhoids   FLEXIBLE SIGMOIDOSCOPY N/A 03/26/2019   Procedure: FLEXIBLE SIGMOIDOSCOPY;  Surgeon: Corbin Ade, MD;  Location: [facility] SUITE;  Service: Endoscopy;  Laterality: N/A;  7:30am   HEMORRHOID BANDING     None         Family History  Problem Relation Age of Onset    Hypertension Mother    Arthritis Other    Colon cancer Neg Hx     Social History   Tobacco Use   Smoking status: Every Day    Packs/day: 0.50    Years: 30.00    Pack years: 15.00    Types: Cigarettes   Smokeless tobacco: Never   Tobacco comments:    Smokes 6-7 cigarettes daily  Vaping Use   Vaping Use: Former  Substance Use Topics   Alcohol use: Yes    Alcohol/week: 0.0 standard drinks    Comment: occ. beer but rare    Drug use: No    Home Medications Prior to Admission medications   Medication Sig Start Date End Date Taking? Authorizing Provider  albuterol (VENTOLIN HFA) 108 (90 Base) MCG/ACT inhaler Inhale 2 puffs into the lungs every 6 (six) hours as needed for wheezing or shortness of breath. 03/08/21  Yes Cheron Schaumann K, PA-C  predniSONE (DELTASONE) 50 MG tablet One tablet a day 03/08/21  Yes Elson Areas, PA-C  allopurinol (ZYLOPRIM) 300 MG tablet Take 300 mg by mouth every morning.    [provider]  atorvastatin (LIPITOR) 20 MG tablet Take 20 mg by mouth daily.    [provider]  Docusate Calcium (STOOL SOFTENER PO) Take by mouth as needed.    [provider]  hydrocortisone (ANUSOL-HC) 2.5 %  rectal cream Place 1 application rectally 2 (two) times daily. For 2 weeks. 12/06/20   Mahala Menghini, PA-C  hydrOXYzine (ATARAX/VISTARIL) 25 MG tablet Take 1 tablet (25 mg total) by mouth every 6 (six) hours as needed for anxiety. Patient taking differently: Take 25 mg by mouth as needed for anxiety. 03/27/19   Long, Wonda Olds, MD  hyoscyamine (LEVBID) 0.375 MG 12 hr tablet TAKE (1) TABLET BY MOUTH TWICE DAILY IN THE MORNING AND AT BEDTIME. 12/02/20   Mahala Menghini, PA-C  LANTUS SOLOSTAR 100 UNIT/ML Solostar Pen Inject 49 Units into the skin at bedtime.  03/12/19   [provider]  linagliptin (TRADJENTA) 5 MG TABS tablet Take 5 mg by mouth daily.    [provider]  lisinopril-hydrochlorothiazide (PRINZIDE,ZESTORETIC) 10-12.5 MG  per tablet Take 1 tablet by mouth every morning.     [provider]  LITETOUCH PEN NEEDLES 31G X 8 MM MISC USING TWICE DAILY.T 03/31/19   [provider]  metFORMIN (GLUCOPHAGE) 1000 MG tablet Take 500 mg by mouth 2 (two) times daily. 02/25/19   [provider]  oxyCODONE-acetaminophen (PERCOCET) 5-325 MG tablet Take 1 tablet by mouth every 8 (eight) hours as needed for severe pain. 07/07/20   Ripley Fraise, MD  tamsulosin (FLOMAX) 0.4 MG CAPS capsule Take 0.4 mg by mouth daily.    [provider]  Wheat Dextrin (BENEFIBER PO) Take 1 Scoop by mouth daily as needed (fiber).    [provider]    Allergies    Patient has no known allergies.  Review of Systems   Review of Systems  HENT:  Negative for rhinorrhea.   Respiratory:  Positive for cough.   All other systems reviewed and are negative.  Physical Exam Updated Vital Signs BP (!) 124/101   Pulse 77   Temp 98.1 F (36.7 C) (Oral)   Resp 16   Ht 6\' 4"  (1.93 m)   Wt 122.5 kg   SpO2 97%   BMI 32.87 kg/m   Physical Exam Vitals and nursing note reviewed.  Constitutional:      General: He is not in acute distress.    Appearance: He is well-developed.  HENT:     Head: Normocephalic and atraumatic.  Eyes:     Conjunctiva/sclera: Conjunctivae normal.  Cardiovascular:     Rate and Rhythm: Normal rate and regular rhythm.     Heart sounds: No murmur heard. Pulmonary:     Effort: Pulmonary effort is normal. No respiratory distress.     Breath sounds: Wheezing present.  Abdominal:     Palpations: Abdomen is soft.     Tenderness: There is no abdominal tenderness.  Musculoskeletal:        General: No swelling.     Cervical back: Neck supple.  Skin:    General: Skin is warm and dry.     Capillary Refill: Capillary refill takes less than 2 seconds.  Neurological:     Mental Status: He is alert.  Psychiatric:        Mood and Affect: Mood normal.    ED Results / Procedures /  Treatments   Labs (all labs ordered are listed, but only abnormal results are displayed) Labs Reviewed  RESP PANEL BY RT-PCR (FLU A&B, COVID) [id number]    EKG None  Radiology DG Chest Port 1 View  Result Date: 03/08/2021 CLINICAL DATA:  Cough EXAM: PORTABLE CHEST 1 VIEW COMPARISON:  12/27/2016 FINDINGS: The heart size and mediastinal contours are within normal  limits. Both lungs are clear. The visualized skeletal structures are unremarkable. IMPRESSION: No acute abnormality of the lungs in [facility] portable projection. Electronically Signed   By: Delanna Ahmadi M.D.   On: 03/08/2021 10:35    Procedures Procedures   Medications Ordered in ED Medications  albuterol (VENTOLIN HFA) 108 (90 Base) MCG/ACT inhaler 2 puff (2 puffs Inhalation Given 03/08/21 1041)    ED Course  I have reviewed the triage vital signs and the nursing notes.  Pertinent labs & imaging results that were available during my care of the patient were reviewed by me and considered in my medical decision making (see chart for details).    MDM Rules/Calculators/A&P                           MDM:  labs ordered,  covid and influenza ordered,  negative,  Chest xray no acute abnormality.  Pt feels better after albuterol.  Pt given rx for albuterol and prednisone  Final Clinical Impression(s) / ED Diagnoses Final diagnoses:  Acute bronchitis, unspecified organism    Rx / DC Orders ED Discharge Orders          Ordered    predniSONE (DELTASONE) 50 MG tablet        03/08/21 1251    albuterol (VENTOLIN HFA) 108 (90 Base) MCG/ACT inhaler  Every 6 hours PRN        03/08/21 1251          An After Visit Summary was printed and given to the patient.    Fransico Meadow, Hershal Coria 03/08/21 1532    Milton Ferguson, MD 03/09/21 [phone]

## 2021-03-08 NOTE — Discharge Instructions (Addendum)
Return if any problems.  See your Physician for recheck in 2-3 days °

## 2021-03-08 NOTE — ED Triage Notes (Signed)
Pt c/o cough and congestion x 3 days. Home Covid test this morning was negative.

## 2021-04-06 ENCOUNTER — Telehealth: Payer: Self-pay

## 2021-04-06 ENCOUNTER — Telehealth: Payer: Self-pay | Admitting: Internal Medicine

## 2021-04-06 DIAGNOSIS — R109 Unspecified abdominal pain: Secondary | ICD-10-CM

## 2021-04-06 NOTE — Telephone Encounter (Signed)
No pharmacy request has been received, informed pt to call back when he knows what the name of the medication is. Pt verbalized understanding.

## 2021-04-06 NOTE — Telephone Encounter (Signed)
Pt called saying he needed a refill on his medication but doesn't know the name of it. I told him to call his pharmacy for them to fax [place] a refill request. He said he was there now and was told that faxed it yesterday. Please advise if we received anything on him.

## 2021-04-06 NOTE — Telephone Encounter (Signed)
Received fax from [employer], insurance is no longer covering Hyoscyamine ER 0.375mg , but 0.125mg  is covered. Pt is requesting an alternative.

## 2021-04-07 ENCOUNTER — Telehealth: Payer: Self-pay | Admitting: Internal Medicine

## 2021-04-07 MED ORDER — HYOSCYAMINE SULFATE 0.125 MG SL SUBL
0.1250 mg | SUBLINGUAL_TABLET | SUBLINGUAL | Status: DC | PRN
Start: 2021-04-07 — End: 2021-04-07

## 2021-04-07 MED ORDER — HYOSCYAMINE SULFATE 0.125 MG PO TABS: 0.1250 mg | ORAL_TABLET | Freq: Three times a day (TID) | ORAL | 3 refills | Status: DC

## 2021-04-07 NOTE — Telephone Encounter (Signed)
Rx sent to pharmacy   

## 2021-04-07 NOTE — Telephone Encounter (Signed)
RX was sent earlier today but listed as in office administered medication. I will send in RX to [facility]

## 2021-04-07 NOTE — Addendum Note (Signed)
Addended by: Tiffany Kocher on: 04/07/2021 02:07 PM   Modules accepted: Orders

## 2021-04-07 NOTE — Telephone Encounter (Signed)
Pt was made aware and verbalized understanding.  

## 2021-04-07 NOTE — Telephone Encounter (Signed)
Pt is requesting a refill on hyoscyamine. Pt was last seen by Dr. Jena Gauss on 10/12/2020. [facility].

## 2021-04-07 NOTE — Telephone Encounter (Signed)
PATIENT NEEDS HIS LEVSIN SENT TO [facility]

## 2021-06-27 ENCOUNTER — Encounter (HOSPITAL_COMMUNITY): Payer: Self-pay | Admitting: *Deleted

## 2021-06-27 ENCOUNTER — Emergency Department (HOSPITAL_COMMUNITY)
Admission: EM | Admit: 2021-06-27 | Discharge: 2021-06-27 | Disposition: A | Discharge status: Home / Self Care | Payer: Medicaid Other | Attending: Emergency Medicine | Admitting: Emergency Medicine

## 2021-06-27 DIAGNOSIS — L02412 Cutaneous abscess of left axilla: Secondary | ICD-10-CM | POA: Diagnosis present

## 2021-06-27 MED ORDER — LIDOCAINE-EPINEPHRINE-TETRACAINE (LET) TOPICAL GEL
3.0000 mL | Freq: Once | TOPICAL | Status: AC
  Administered 2021-06-27: 3 mL via TOPICAL
  Filled 2021-06-27: qty 3

## 2021-06-27 MED ORDER — DOXYCYCLINE HYCLATE 100 MG PO TABS
100.0000 mg | ORAL_TABLET | Freq: Once | ORAL | Status: AC
  Administered 2021-06-27: 100 mg via ORAL
  Filled 2021-06-27: qty 1

## 2021-06-27 MED ORDER — DOXYCYCLINE HYCLATE 100 MG PO CAPS: 100.0000 mg | ORAL_CAPSULE | Freq: Two times a day (BID) | ORAL | 0 refills | Status: DC

## 2021-06-27 MED ORDER — HYDROCODONE-ACETAMINOPHEN 5-325 MG PO TABS: 1.0000 | ORAL_TABLET | Freq: Four times a day (QID) | ORAL | 0 refills | Status: DC | PRN

## 2021-06-27 MED ORDER — POVIDONE-IODINE 10 % EX SOLN
CUTANEOUS | Status: DC | PRN
  Filled 2021-06-27: qty 14.8

## 2021-06-27 NOTE — Discharge Instructions (Signed)
Apply warm compresses to the site at least twice daily as discussed to keep it open and draining for the next several days.  Complete entire course of antibiotics prescribed.  You have been prescribed a small quantity of pain medication which you may use if needed for increased pain relief.  This medication will make you drowsy, do not drive within 4 hours of taking this medication.  Get rechecked for any worsening pain or swelling. ?

## 2021-06-27 NOTE — ED Triage Notes (Signed)
Boil under left arm

## 2021-06-28 NOTE — ED Provider Notes (Signed)
?[facility] EMERGENCY DEPARTMENT ?Provider Note ? ? ?CSN: [id number] ?Arrival date & time: 06/27/21  1618 ? ?  ? ?History ? ?Chief Complaint  ?Patient presents with  ? Abscess  ? ? ?Daniel Nicholson is a 51 y.o. male. ? ?The history is provided by the patient and the spouse.  ?Abscess ?Location:  Shoulder/arm ?Shoulder/arm abscess location:  L axilla ?Abscess quality: draining, painful, redness and warmth   ?Duration:  3 days ?Progression:  Worsening ?Pain details:  ?  Quality:  Throbbing and sharp ?  Severity:  Severe ?  Duration:  2 days ?  Timing:  Constant ?  Progression:  Worsening ?Chronicity:  Recurrent (reports similar abscess requiring I&D previously in same axilla x 1.) ?Context: not diabetes and not injected drug use   ?Relieved by:  Warm compresses (It has started spontaneously draining since arriving here, feels a little better now) ?Ineffective treatments:  Draining/squeezing ?Associated symptoms: no fever, no nausea and no vomiting   ?Risk factors: prior abscess   ?Risk factors: no hx of MRSA   ? ?  ? ?Home Medications ?Prior to Admission medications   ?Medication Sig Start Date End Date Taking? Authorizing Provider  ?doxycycline (VIBRAMYCIN) 100 MG capsule Take 1 capsule (100 mg total) by mouth 2 (two) times daily. 06/27/21  Yes Laporshia Hogen, Raynelle Fanning, PA-C  ?HYDROcodone-acetaminophen (NORCO/VICODIN) 5-325 MG tablet Take 1 tablet by mouth every 6 (six) hours as needed. 06/27/21  Yes Rashid Whitenight, Raynelle Fanning, PA-C  ?albuterol (VENTOLIN HFA) 108 (90 Base) MCG/ACT inhaler Inhale 2 puffs into the lungs every 6 (six) hours as needed for wheezing or shortness of breath. 03/08/21   Elson Areas, PA-C  ?allopurinol (ZYLOPRIM) 300 MG tablet Take 300 mg by mouth every morning.    [provider]  ?atorvastatin (LIPITOR) 20 MG tablet Take 20 mg by mouth daily.    [provider]  ?Docusate Calcium (STOOL SOFTENER PO) Take by mouth as needed.    [provider]  ?hydrocortisone (ANUSOL-HC) 2.5 % rectal  cream Place 1 application rectally 2 (two) times daily. For 2 weeks. 12/06/20   Tiffany Kocher, PA-C  ?hydrOXYzine (ATARAX/VISTARIL) 25 MG tablet Take 1 tablet (25 mg total) by mouth every 6 (six) hours as needed for anxiety. ?Patient taking differently: Take 25 mg by mouth as needed for anxiety. 03/27/19   Long, Arlyss Repress, MD  ?hyoscyamine (LEVSIN) 0.125 MG tablet Take 1 tablet (0.125 mg total) by mouth 4 (four) times daily -  before meals and at bedtime. As needed for cramps or diarrhea. 04/07/21   Tiffany Kocher, PA-C  ?LANTUS SOLOSTAR 100 UNIT/ML Solostar Pen Inject 49 Units into the skin at bedtime.  03/12/19   [provider]  ?linagliptin (TRADJENTA) 5 MG TABS tablet Take 5 mg by mouth daily.    [provider]  ?lisinopril-hydrochlorothiazide (PRINZIDE,ZESTORETIC) 10-12.5 MG per tablet Take 1 tablet by mouth every morning.     [provider]  ?LITETOUCH PEN NEEDLES 31G X 8 MM MISC USING TWICE DAILY.T 03/31/19   [provider]  ?metFORMIN (GLUCOPHAGE) 1000 MG tablet Take 500 mg by mouth 2 (two) times daily. 02/25/19   [provider]  ?predniSONE (DELTASONE) 50 MG tablet One tablet a day 03/08/21   Elson Areas, PA-C  ?tamsulosin (FLOMAX) 0.4 MG CAPS capsule Take 0.4 mg by mouth daily.    [provider]  ?Wheat Dextrin (BENEFIBER PO) Take 1 Scoop by mouth daily as needed (fiber).    [provider]  ?   ? ?Allergies    ?Patient has no known allergies.   ? ?Review of Systems   ?Review of Systems  ?Constitutional:  Negative for fever.  ?Gastrointestinal:  Negative for nausea and vomiting.  ?Endocrine: Negative for polydipsia and polyuria.  ?Skin:   ?     Negative except as mentioned in HPI. ? ?  ?All other systems reviewed and are negative. ? ?Physical Exam ?Updated Vital Signs ?BP (!) 129/100 (BP Location: Right Arm)   Pulse 93   Temp 98.1 ?F (36.7 ?C) (Oral)   Resp 14   Ht 6\' 4"  (1.93 m)   Wt 127 kg   SpO2 95%   BMI 34.08 kg/m?   ?Physical Exam ?Constitutional:   ?   Appearance: He is well-developed.  ?HENT:  ?   Head: Normocephalic.  ?Cardiovascular:  ?   Rate and Rhythm: Normal rate.  ?Pulmonary:  ?   Effort: Pulmonary effort is normal.  ?Skin: ?   Findings: Abscess present.  ?   Comments: Fluctuant, draining raised abscess left axilla, measuring 3 cm, subtle induration at periphery, no red streaking.   ?Neurological:  ?   Mental Status: He is alert and oriented to person, place, and time.  ?   Sensory: No sensory deficit.  ? ? ?ED Results / Procedures / Treatments   ?Labs ?(all labs ordered are listed, but only abnormal results are displayed) ?Labs Reviewed - No data to display ? ?EKG ?None ? ?Radiology ?No results found. ? ?Procedures ?Procedures  ? ? ?INCISION AND DRAINAGE ?Performed by: ?Consent: Verbal consent obtained. ?Risks and benefits: risks, benefits and alternatives were discussed ?Type: abscess ? ?Body area: left axilla ? ?Anesthesia: topical LET  ? ?Incision was made with a scalpel. ? ?Local anesthetic: n/a ? ?Anesthetic total: topical LET 4 ml ? ?Complexity: complex ?Blunt dissection to break up loculations ? ?Drainage: purulent ? ?Drainage amount: moderate ? ?Packing material: none ? ?Patient tolerance: Patient tolerated the procedure well with no immediate complications. ? ? ? ?Medications Ordered in ED ?Medications  ?lidocaine-EPINEPHrine-tetracaine (LET) topical gel (3 mLs Topical Given 06/27/21 1830)  ?doxycycline (VIBRA-TABS) tablet 100 mg (100 mg Oral Given 06/27/21 1926)  ? ? ?ED Course/ Medical Decision Making/ A&P ?  ?                        ?Medical Decision Making ?Risk ?Prescription drug management. ? ? ?Doxycycline prescribed, discussed warm compresses, return precautions outlined.  ? ? ? ? ? ? ? ?Final Clinical Impression(s) / ED Diagnoses ?Final diagnoses:  ?Abscess of left axilla  ? ? ?Rx / DC Orders ?ED Discharge Orders   ? ?      Ordered  ?  doxycycline (VIBRAMYCIN) 100 MG capsule  2 times daily        ? 06/27/21 1915  ?  HYDROcodone-acetaminophen (NORCO/VICODIN) 5-325 MG tablet  Every 6 hours PRN       ? 06/27/21 1917  ? ?  ?  ? ?  ? ? ?  ?06/29/21, PA-C ?06/28/21 1903 ? ?  ?06/30/21, MD ?06/29/21 1507 ? ?

## 2021-10-25 ENCOUNTER — Ambulatory Visit: Payer: Medicaid Other | Admitting: Internal Medicine

## 2021-10-25 ENCOUNTER — Encounter: Payer: Self-pay | Admitting: Internal Medicine

## 2021-10-25 VITALS — BP 142/101 | HR 79 | Temp 97.7°F | Ht 76.0 in | Wt 292.8 lb

## 2021-10-25 DIAGNOSIS — K219 Gastro-esophageal reflux disease without esophagitis: Secondary | ICD-10-CM

## 2021-10-25 DIAGNOSIS — R151 Fecal smearing: Secondary | ICD-10-CM

## 2021-10-25 NOTE — Patient Instructions (Signed)
It was good to see you again today!  I suspect your continued tendency towards loose stool and diarrhea may well be related to metformin.  Since the hyoscyamine is not working, I recommend he stop that medication  In addition, I also recommend you stop taking metformin for 8 weeks in conjunction with your prescribing physician.  As discussed, and metformin is commonly associated with the type of altered bowel function which you are describing.  If your bowel function improves off metformin you should stay off this medication.  If it does not change, then further evaluation would be warranted.  We will plan to see you back in 8 weeks.

## 2021-10-25 NOTE — Progress Notes (Signed)
Primary Care Physician:  Cline Crock, NP Primary Gastroenterologist:  Dr. Jena Gauss  Pre-Procedure History & Physical: HPI:  Daniel Nicholson is a 51 y.o. male here for follow-up of altered bowel function.  Patient continues to have urgency and tendency towards loose stool irritated anorectum may go up to 5 times daily.  He does this about 50% of the time.  Levbid no longer covered by his insurance.  He tried LevBID which he feels actually exacerbates his condition.  History of prior hemorrhoid banding.  Sigmoidoscopy 2020 demonstrated no anorectal abnormalities.  Prior colonoscopy as outlined. It is notable his bowel symptoms started since being on metformin. He notes a difficult time with wiping after bowel function as well.  Some fecal seepage.  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  Sweats a lot.  Past Medical History:  Diagnosis Date   Acid reflux    Diabetes mellitus without complication (HCC)    Gout    Hemorrhoids    s/p right posterior, left lateral, right anterior banding. Last banding procedure Jan 2015.    Hx of anxiety disorder    Hypertension    Neck pain    Poor circulation    S/P tooth extraction 08/16/2016   three teeth extracted   Tremors of nervous system     Past Surgical History:  Procedure Laterality Date   COLONOSCOPY N/A 12/16/2012   Dr. Jena Gauss- internal hemorrhoids otherwise normal. Screening in 2024.    COLONOSCOPY N/A 07/11/2017   Dr. Jena Gauss: Nonbleeding grade 1 internal hemorrhoids   FLEXIBLE SIGMOIDOSCOPY N/A 03/26/2019   Procedure: FLEXIBLE SIGMOIDOSCOPY;  Surgeon: Corbin Ade, MD;  Location: [facility];  Service: Endoscopy;  Laterality: N/A;  7:30am   HEMORRHOID BANDING     None      Prior to Admission medications   Medication Sig Start Date End Date Taking? Authorizing Provider  albuterol (VENTOLIN HFA) 108 (90 Base) MCG/ACT inhaler Inhale 2 puffs into the lungs every 6 (six) hours as needed for wheezing or shortness of  breath. 03/08/21  Yes Cheron Schaumann K, PA-C  allopurinol (ZYLOPRIM) 300 MG tablet Take 300 mg by mouth every morning.   Yes [provider]  atorvastatin (LIPITOR) 20 MG tablet Take 20 mg by mouth daily.   Yes [provider]  hydrOXYzine (ATARAX/VISTARIL) 25 MG tablet Take 1 tablet (25 mg total) by mouth every 6 (six) hours as needed for anxiety. Patient taking differently: Take 25 mg by mouth as needed for anxiety. 03/27/19  Yes Long, Arlyss Repress, MD  LANTUS SOLOSTAR 100 UNIT/ML Solostar Pen Inject 49 Units into the skin at bedtime.  03/12/19  Yes [provider]  linagliptin (TRADJENTA) 5 MG TABS tablet Take 5 mg by mouth daily.   Yes [provider]  lisinopril-hydrochlorothiazide (PRINZIDE,ZESTORETIC) 10-12.5 MG per tablet Take 1 tablet by mouth every morning.    Yes [provider]  LITETOUCH PEN NEEDLES 31G X 8 MM MISC USING TWICE DAILY.T 03/31/19  Yes [provider]  metFORMIN (GLUCOPHAGE) 1000 MG tablet Take 500 mg by mouth 2 (two) times daily. 02/25/19  Yes [provider]  tamsulosin (FLOMAX) 0.4 MG CAPS capsule Take 0.4 mg by mouth daily.   Yes [provider]    Allergies as of 10/25/2021   (No Known Allergies)    Family History  Problem Relation Age of Onset   Hypertension Mother    Arthritis Other    Colon cancer Neg Hx  Social History   Socioeconomic History   Marital status: Divorced    Spouse name: Not on file   Number of children: Not on file   Years of education: 12th grade   Highest education level: Not on file  Occupational History   Occupation: unemployed  Tobacco Use   Smoking status: Every Day    Packs/day: 0.50    Years: 30.00    Total pack years: 15.00    Types: Cigarettes   Smokeless tobacco: Never   Tobacco comments:    Smokes 6-7 cigarettes daily  Vaping Use   Vaping Use: Former  Substance and Sexual Activity   Alcohol use: Yes    Alcohol/week: 0.0 standard drinks of  alcohol    Comment: occ. beer but rare    Drug use: No   Sexual activity: Yes    Birth control/protection: Condom  Other Topics Concern   Not on file  Social History Narrative   Not on file   Social Determinants of Health   Financial Resource Strain: Not on file  Food Insecurity: Not on file  Transportation Needs: Not on file  Physical Activity: Not on file  Stress: Not on file  Social Connections: Not on file  Intimate Partner Violence: Not on file    Review of Systems: See HPI, otherwise negative ROS  Physical Exam: BP (!) 142/101 (BP Location: Left Arm, Patient Position: Sitting, Cuff Size: Large)   Pulse 79   Temp 97.7 F (36.5 C) (Temporal)   Ht 6\' 4"  (1.93 m)   Wt 292 lb 12.8 oz (132.8 kg)   SpO2 95%   BMI 35.64 kg/m  General:   Alert,  Well-developed, well-nourished, pleasant and cooperative in NAD Heart:  Regular rate and rhythm; no murmurs, clicks, rubs,  or gallops. Abdomen: Non-distended, normal bowel sounds.  Soft and nontender without appreciable mass or hepatosplenomegaly.  Pulses:  Normal pulses noted. Extremities:  Without clubbing or edema.  Impression/Plan: 51 year old gentleman with multiple comorbidities including diabetes on metformin and insulin  - continues to complaints of altered stool frequency hyper defecation difficulty with hygiene with stooling.  Multiple stools daily. Prior endoscopic evaluation negative.  I suspect metformin is an ongoing culprit.  Recommendations:   I suspect your continued tendency towards loose stool and diarrhea may well be related to metformin.  Since the hyoscyamine is not working, I recommend he stop that medication  In addition, I also recommend you stop taking metformin for 8 weeks in conjunction with your prescribing physician.  As discussed, and metformin is commonly associated with the type of altered bowel function which you are describing.  If your bowel function improves off metformin you should  stay off this medication.  If it does not change, then further evaluation would be warranted.  We will plan to see you back in 8 weeks.    Notice: This dictation was prepared with Dragon dictation along with smaller phrase technology. Any transcriptional errors that result from this process are unintentional and may not be corrected upon review.

## 2021-11-11 ENCOUNTER — Emergency Department (HOSPITAL_COMMUNITY)
Admission: EM | Admit: 2021-11-11 | Discharge: 2021-11-11 | Disposition: A | Discharge status: Home / Self Care | Payer: Medicaid Other | Attending: Emergency Medicine | Admitting: Emergency Medicine

## 2021-11-11 ENCOUNTER — Other Ambulatory Visit: Payer: Self-pay

## 2021-11-11 ENCOUNTER — Encounter (HOSPITAL_COMMUNITY): Payer: Self-pay | Admitting: Emergency Medicine

## 2021-11-11 DIAGNOSIS — Z7984 Long term (current) use of oral hypoglycemic drugs: Secondary | ICD-10-CM | POA: Insufficient documentation

## 2021-11-11 DIAGNOSIS — Z79899 Other long term (current) drug therapy: Secondary | ICD-10-CM | POA: Diagnosis not present

## 2021-11-11 DIAGNOSIS — I10 Essential (primary) hypertension: Secondary | ICD-10-CM | POA: Insufficient documentation

## 2021-11-11 DIAGNOSIS — Z794 Long term (current) use of insulin: Secondary | ICD-10-CM | POA: Insufficient documentation

## 2021-11-11 DIAGNOSIS — E1165 Type 2 diabetes mellitus with hyperglycemia: Secondary | ICD-10-CM | POA: Diagnosis not present

## 2021-11-11 DIAGNOSIS — R739 Hyperglycemia, unspecified: Secondary | ICD-10-CM | POA: Diagnosis present

## 2021-11-11 LAB — URINALYSIS, ROUTINE W REFLEX MICROSCOPIC
Bilirubin Urine: NEGATIVE
Glucose, UA: >=500 mg/dL — AB
Hgb urine dipstick: NEGATIVE
Ketones, ur: NEGATIVE mg/dL
Leukocytes,Ua: NEGATIVE
Nitrite: NEGATIVE
Protein, ur: NEGATIVE mg/dL
Specific Gravity, Urine: 1.011 (ref 1.005–1.030)
pH: 7 (ref 5.0–8.0)

## 2021-11-11 LAB — CBC
HCT: 45.1 % (ref 39.0–52.0)
Hemoglobin: 15.5 g/dL (ref 13.0–17.0)
MCH: 29.5 pg (ref 26.0–34.0)
MCHC: 34.4 g/dL (ref 30.0–36.0)
MCV: 85.7 fL (ref 80.0–100.0)
Platelets: 212 10*3/uL (ref 150–400)
RBC: 5.26 MIL/uL (ref 4.22–5.81)
RDW: 12.8 % (ref 11.5–15.5)
WBC: 6.8 10*3/uL (ref 4.0–10.5)
nRBC: 0 % (ref 0.0–0.2)

## 2021-11-11 LAB — BASIC METABOLIC PANEL
Anion gap: 9 (ref 5–15)
BUN: 15 mg/dL (ref 6–20)
CO2: 24 mmol/L (ref 22–32)
Calcium: 8.9 mg/dL (ref 8.9–10.3)
Chloride: 100 mmol/L (ref 98–111)
Creatinine, Ser: 1.19 mg/dL (ref 0.61–1.24)
GFR, Estimated: >60 mL/min (ref >60)
Glucose, Bld: 357 mg/dL — ABNORMAL HIGH (ref 70–99)
Potassium: 4 mmol/L (ref 3.5–5.1)
Sodium: 133 mmol/L — ABNORMAL LOW (ref 135–145)

## 2021-11-11 LAB — CBG MONITORING, ED
Glucose-Capillary: 388 mg/dL — ABNORMAL HIGH (ref 70–99)
Glucose-Capillary: 299 mg/dL — ABNORMAL HIGH (ref 70–99)

## 2021-11-11 MED ORDER — SODIUM CHLORIDE 0.9 % IV BOLUS
1000.0000 mL | Freq: Once | INTRAVENOUS | Status: AC
  Administered 2021-11-11: 1000 mL via INTRAVENOUS

## 2021-11-11 MED ORDER — INSULIN ASPART 100 UNIT/ML IJ SOLN
10.0000 [IU] | Freq: Once | INTRAMUSCULAR | Status: AC
  Administered 2021-11-11: 10 [IU] via INTRAVENOUS
  Filled 2021-11-11: qty 1

## 2021-11-11 NOTE — ED Provider Notes (Signed)
[facility] EMERGENCY DEPARTMENT Provider Note   CSN: [id number] Arrival date & time: 11/11/21  1725     History  Chief Complaint  Patient presents with   Hyperglycemia    Daniel Nicholson is a 51 y.o. male.   Hyperglycemia  This patient is a 51 year old male, he is a known diabetic for approximately 30 years he is also treated for hypertension with lisinopril and hydrochlorothiazide.  He is currently on Tradjenta and Lantus after his metformin was stopped within the last week.  After being on metformin for many years he eventually developed diarrhea, this was thought to have been caused by the metformin so it was discontinued.  He notes that over the last 2 weeks he has had a steep incline and his blood sugar readings some reading "too high" and today it was no different.  He does report having increasing urinary frequency, increasing thirst, he is lightheaded when he stands.  He denies chest pain shortness of breath abdominal pain.  His diarrhea has now gone away.  The patient has been slowly increasing the dose of his Lantus and is gone from 49 units at night up to 55 units at night but nonetheless his sugar still continues to rise.    Home Medications Prior to Admission medications   Medication Sig Start Date End Date Taking? Authorizing Provider  albuterol (VENTOLIN HFA) 108 (90 Base) MCG/ACT inhaler Inhale 2 puffs into the lungs every 6 (six) hours as needed for wheezing or shortness of breath. 03/08/21  Yes Elson Areas, PA-C  ALLERGY RELIEF 10 MG tablet Take 10 mg by mouth every morning. 11/07/21  Yes [provider]  allopurinol (ZYLOPRIM) 300 MG tablet Take 300 mg by mouth every morning.   Yes [provider]  atorvastatin (LIPITOR) 20 MG tablet Take 20 mg by mouth daily.   Yes [provider]  Calcium Polycarbophil (FIBER-CAPS PO) Take by mouth.   Yes [provider]  glipiZIDE (GLUCOTROL) 5 MG tablet Take 5 mg by mouth 2 (two) times  daily. 11/09/21  Yes [provider]  hydrOXYzine (ATARAX/VISTARIL) 25 MG tablet Take 1 tablet (25 mg total) by mouth every 6 (six) hours as needed for anxiety. Patient taking differently: Take 25 mg by mouth 3 (three) times daily as needed for anxiety. 03/27/19  Yes Long, Arlyss Repress, MD  LANTUS SOLOSTAR 100 UNIT/ML Solostar Pen Inject 55 Units into the skin at bedtime. 03/12/19  Yes [provider]  linagliptin (TRADJENTA) 5 MG TABS tablet Take 5 mg by mouth daily.   Yes [provider]  lisinopril-hydrochlorothiazide (PRINZIDE,ZESTORETIC) 10-12.5 MG per tablet Take 1 tablet by mouth every morning.    Yes [provider]  Vitamin D, Ergocalciferol, (DRISDOL) 1.25 MG (50000 UNIT) CAPS capsule Take 50,000 Units by mouth once a week. 08/17/21  Yes [provider]  LITETOUCH PEN NEEDLES 31G X 8 MM MISC USING TWICE DAILY.T 03/31/19   [provider]      Allergies    Patient has no known allergies.    Review of Systems   Review of Systems  All other systems reviewed and are negative.   Physical Exam Updated Vital Signs BP (!) 115/99 (BP Location: Left Arm)   Pulse 83   Temp 98.3 F (36.8 C) (Oral)   Resp 17   Ht 1.93 m (6\' 4" )   Wt 133.8 kg   SpO2 99%   BMI 35.91 kg/m  Physical Exam Vitals and nursing note reviewed.  Constitutional:      General: He is not in acute distress.    Appearance: He is well-developed.  HENT:     Head: Normocephalic and atraumatic.     Mouth/Throat:     Mouth: Mucous membranes are dry.     Pharynx: No oropharyngeal exudate.  Eyes:     General: No scleral icterus.       Right eye: No discharge.        Left eye: No discharge.     Conjunctiva/sclera: Conjunctivae normal.     Pupils: Pupils are equal, round, and reactive to light.  Neck:     Thyroid: No thyromegaly.     Vascular: No JVD.  Cardiovascular:     Rate and Rhythm: Normal rate and regular rhythm.     Heart sounds: Normal heart sounds. No  murmur heard.    No friction rub. No gallop.  Pulmonary:     Effort: Pulmonary effort is normal. No respiratory distress.     Breath sounds: Normal breath sounds. No wheezing or rales.  Abdominal:     General: Bowel sounds are normal. There is no distension.     Palpations: Abdomen is soft. There is no mass.     Tenderness: There is no abdominal tenderness.  Musculoskeletal:        General: No tenderness. Normal range of motion.     Cervical back: Normal range of motion and neck supple.     Right lower leg: No edema.     Left lower leg: No edema.  Lymphadenopathy:     Cervical: No cervical adenopathy.  Skin:    General: Skin is warm and dry.     Findings: No erythema or rash.  Neurological:     General: No focal deficit present.     Mental Status: He is alert.     Coordination: Coordination normal.  Psychiatric:        Behavior: Behavior normal.     ED Results / Procedures / Treatments   Labs (all labs ordered are listed, but only abnormal results are displayed) Labs Reviewed  BASIC METABOLIC PANEL - Abnormal; Notable for the following components:      Result Value   Sodium 133 (*)    Glucose, Bld 357 (*)    All other components within normal limits  URINALYSIS, ROUTINE W REFLEX MICROSCOPIC - Abnormal; Notable for the following components:   Glucose, UA >=500 (*)    Bacteria, UA RARE (*)    All other components within normal limits  CBG MONITORING, ED - Abnormal; Notable for the following components:   Glucose-Capillary 388 (*)    All other components within normal limits  CBG MONITORING, ED - Abnormal; Notable for the following components:   Glucose-Capillary 299 (*)    All other components within normal limits  CBC  CBG MONITORING, ED    EKG None  Radiology No results found.  Procedures Procedures    Medications Ordered in ED Medications  sodium chloride 0.9 % bolus 1,000 mL (0 mLs Intravenous Stopped 11/11/21 2005)  sodium chloride 0.9 % bolus 1,000 mL  (0 mLs Intravenous Stopped 11/11/21 2005)  insulin aspart (novoLOG) injection 10 Units (10 Units Intravenous Given 11/11/21 1950)    ED Course/ Medical Decision Making/ A&P                           Medical Decision Making Amount and/or Complexity of Data Reviewed Labs: ordered.  Risk Prescription drug management.   This patient presents to the ED for concern of hyperglycemia, this involves an extensive number of treatment options, and is a complaint that carries with it a high risk of complications and morbidity.  The differential diagnosis includes medication reaction, discontinuation of metformin, less likely to be infection or toxic process   Co morbidities that complicate the patient evaluation  Diabetes and hypertension   Additional history obtained:  Additional history obtained from electronic medical record External records from outside source obtained and reviewed including multiple visits to family doctor for his diabetic treatments.  Prior ED visits for complications of diabetes including abscesses, visits to the eye doctor for his ophthalmologic complications of diabetes   Lab Tests:  I Ordered, and personally interpreted labs.  The pertinent results include: CBC and metabolic panel showing hyperglycemia but no acidosis    Cardiac Monitoring: / EKG:  The patient was maintained on a cardiac monitor.  I personally viewed and interpreted the cardiac monitored which showed an underlying rhythm of: Normal sinus rhythm   Consultations Obtained:  I requested consultation with the clinical pharmacist in the ED,  and discussed lab and imaging findings as well as pertinent plan - they recommend: Increasing the Lantus and stopping the glipizide to avoid the concha mitten hypoglycemia that can occur when these medications are combined.  We will go up to 65 units at night based on pharmacy recommendations   Problem List / ED Course / Critical interventions / Medication  management  Hyperglycemia, improved with insulin, vital signs remain normal, patient remained without any systemic symptoms, he is well-appearing at the time of discharge I ordered medication including Lantus insulin increase for chronic management and discontinuing glipizide as well Reevaluation of the patient after these medicines showed that the patient improved I have reviewed the patients home medicines and have made adjustments as needed   Social Determinants of Health:  Chronic diabetic   Test / Admission - Considered:  Considered admission but the patient is not in DKA.  I did counsel the patient and the family at length regarding his dietary choices.  He unfortunately has a hankering for fried foods and breads, we discussed this at length and he understands the indications for following up with his doctor and dietary restrictions.  He will do better in his attempt to eat a more healthy diet        Final Clinical Impression(s) / ED Diagnoses Final diagnoses:  Hyperglycemia    Rx / DC Orders ED Discharge Orders     None         Eber Hong, MD 11/11/21 2153

## 2021-11-11 NOTE — ED Notes (Signed)
Pt informed X 2 of need for urine sample. Given urinal.

## 2021-11-11 NOTE — ED Triage Notes (Signed)
Pt presents with symptoms of hyperglycemia, PCP has adjusted diabetic medications and continues to have high blood sugars.

## 2021-11-11 NOTE — Discharge Instructions (Signed)
Please start taking 65 units of Lantus at night before you go to bed.  Make sure that you are taking your blood sugar 3 times a day and share this information with your doctor.  Please avoid bread and fried foods as this will cause your blood sugar to go up.  Thank you for allowing [place] to treat you in the emergency department today.  After reviewing your examination and potential testing that was done it appears that you are safe to go home.  I would like for you to follow-up with your doctor within the next several days, have them obtain your results and follow-up with them to review all of these tests.  If you should develop severe or worsening symptoms return to the emergency department immediately

## 2021-12-26 ENCOUNTER — Encounter: Payer: Self-pay | Admitting: *Deleted

## 2022-01-26 ENCOUNTER — Emergency Department (HOSPITAL_COMMUNITY)
Admission: EM | Admit: 2022-01-26 | Discharge: 2022-01-27 | Disposition: A | Discharge status: Home / Self Care | Payer: Medicaid Other | Attending: Emergency Medicine | Admitting: Emergency Medicine

## 2022-01-26 ENCOUNTER — Encounter (HOSPITAL_COMMUNITY): Payer: Self-pay | Admitting: *Deleted

## 2022-01-26 ENCOUNTER — Emergency Department (HOSPITAL_COMMUNITY): Payer: Medicaid Other

## 2022-01-26 ENCOUNTER — Other Ambulatory Visit: Payer: Self-pay

## 2022-01-26 DIAGNOSIS — R109 Unspecified abdominal pain: Secondary | ICD-10-CM | POA: Diagnosis present

## 2022-01-26 DIAGNOSIS — K5900 Constipation, unspecified: Secondary | ICD-10-CM | POA: Insufficient documentation

## 2022-01-26 DIAGNOSIS — Z79899 Other long term (current) drug therapy: Secondary | ICD-10-CM | POA: Insufficient documentation

## 2022-01-26 DIAGNOSIS — Z7984 Long term (current) use of oral hypoglycemic drugs: Secondary | ICD-10-CM | POA: Insufficient documentation

## 2022-01-26 DIAGNOSIS — E119 Type 2 diabetes mellitus without complications: Secondary | ICD-10-CM | POA: Diagnosis not present

## 2022-01-26 DIAGNOSIS — R1084 Generalized abdominal pain: Secondary | ICD-10-CM

## 2022-01-26 DIAGNOSIS — Z794 Long term (current) use of insulin: Secondary | ICD-10-CM | POA: Insufficient documentation

## 2022-01-26 DIAGNOSIS — I10 Essential (primary) hypertension: Secondary | ICD-10-CM | POA: Diagnosis not present

## 2022-01-26 LAB — COMPREHENSIVE METABOLIC PANEL
ALT: 51 U/L — ABNORMAL HIGH (ref 0–44)
AST: 32 U/L (ref 15–41)
Albumin: 4.1 g/dL (ref 3.5–5.0)
Alkaline Phosphatase: 56 U/L (ref 38–126)
Anion gap: 12 (ref 5–15)
BUN: 11 mg/dL (ref 6–20)
CO2: 23 mmol/L (ref 22–32)
Calcium: 9.2 mg/dL (ref 8.9–10.3)
Chloride: 100 mmol/L (ref 98–111)
Creatinine, Ser: 1.18 mg/dL (ref 0.61–1.24)
GFR, Estimated: >60 mL/min (ref >60)
Glucose, Bld: 396 mg/dL — ABNORMAL HIGH (ref 70–99)
Potassium: 3.8 mmol/L (ref 3.5–5.1)
Sodium: 135 mmol/L (ref 135–145)
Total Bilirubin: 0.7 mg/dL (ref 0.3–1.2)
Total Protein: 7.1 g/dL (ref 6.5–8.1)

## 2022-01-26 LAB — CBC
HCT: 47.4 % (ref 39.0–52.0)
Hemoglobin: 15.7 g/dL (ref 13.0–17.0)
MCH: 29.8 pg (ref 26.0–34.0)
MCHC: 33.1 g/dL (ref 30.0–36.0)
MCV: 90.1 fL (ref 80.0–100.0)
Platelets: 178 10*3/uL (ref 150–400)
RBC: 5.26 MIL/uL (ref 4.22–5.81)
RDW: 13.3 % (ref 11.5–15.5)
WBC: 8.7 10*3/uL (ref 4.0–10.5)
nRBC: 0 % (ref 0.0–0.2)

## 2022-01-26 LAB — URINALYSIS, ROUTINE W REFLEX MICROSCOPIC
Bacteria, UA: NONE SEEN
Bilirubin Urine: NEGATIVE
Glucose, UA: >=500 mg/dL — AB
Hgb urine dipstick: NEGATIVE
Ketones, ur: NEGATIVE mg/dL
Leukocytes,Ua: NEGATIVE
Nitrite: NEGATIVE
Protein, ur: NEGATIVE mg/dL
Specific Gravity, Urine: 1.02 (ref 1.005–1.030)
pH: 6 (ref 5.0–8.0)

## 2022-01-26 LAB — LIPASE, BLOOD: Lipase: 65 U/L — ABNORMAL HIGH (ref 11–51)

## 2022-01-26 MED ORDER — ONDANSETRON HCL 4 MG/2ML IJ SOLN
4.0000 mg | Freq: Once | INTRAMUSCULAR | Status: AC
  Administered 2022-01-26: 4 mg via INTRAVENOUS
  Filled 2022-01-26: qty 2

## 2022-01-26 MED ORDER — MORPHINE SULFATE (PF) 4 MG/ML IV SOLN
4.0000 mg | Freq: Once | INTRAVENOUS | Status: AC
  Administered 2022-01-26: 4 mg via INTRAVENOUS
  Filled 2022-01-26: qty 1

## 2022-01-26 NOTE — ED Triage Notes (Signed)
Pt with mid abd pain since last night.  LBM the other night and normal per pt. Denies any N/V/D

## 2022-01-26 NOTE — ED Provider Notes (Signed)
[facility] EMERGENCY DEPARTMENT Provider Note   CSN: [id number] Arrival date & time: 01/26/22  2111     History  Chief Complaint  Patient presents with   Abdominal Pain    Daniel Nicholson is a 51 y.o. male.  HPI     This is a 51 year old male who presents with abdominal pain.  Patient reports 1 day history of periumbilical lower abdominal pain.  Denies any radiation of the pain.  Has not had any urinary symptoms.  Reports nausea without vomiting.  He has not had a bowel movement in 3 days.  He states that this is somewhat abnormal for him.  No abdominal surgical history.  No dysuria.  No fevers.  Home Medications Prior to Admission medications   Medication Sig Start Date End Date Taking? Authorizing Provider  polyethylene glycol (MIRALAX) 17 g packet Take 17 g by mouth daily. 01/27/22  Yes Sri Clegg, Barbette Hair, MD  albuterol (VENTOLIN HFA) 108 (90 Base) MCG/ACT inhaler Inhale 2 puffs into the lungs every 6 (six) hours as needed for wheezing or shortness of breath. 03/08/21   Fransico Meadow, PA-C  ALLERGY RELIEF 10 MG tablet Take 10 mg by mouth every morning. 11/07/21   [provider]  allopurinol (ZYLOPRIM) 300 MG tablet Take 300 mg by mouth every morning.    [provider]  atorvastatin (LIPITOR) 20 MG tablet Take 20 mg by mouth daily.    [provider]  Calcium Polycarbophil (FIBER-CAPS PO) Take by mouth.    [provider]  glipiZIDE (GLUCOTROL) 5 MG tablet Take 5 mg by mouth 2 (two) times daily. 11/09/21   [provider]  hydrOXYzine (ATARAX/VISTARIL) 25 MG tablet Take 1 tablet (25 mg total) by mouth every 6 (six) hours as needed for anxiety. Patient taking differently: Take 25 mg by mouth 3 (three) times daily as needed for anxiety. 03/27/19   Long, Wonda Olds, MD  LANTUS SOLOSTAR 100 UNIT/ML Solostar Pen Inject 55 Units into the skin at bedtime. 03/12/19   [provider]  linagliptin (TRADJENTA) 5 MG TABS tablet Take 5  mg by mouth daily.    [provider]  lisinopril-hydrochlorothiazide (PRINZIDE,ZESTORETIC) 10-12.5 MG per tablet Take 1 tablet by mouth every morning.     [provider]  LITETOUCH PEN NEEDLES 31G X 8 MM MISC USING TWICE DAILY.T 03/31/19   [provider]  Vitamin D, Ergocalciferol, (DRISDOL) 1.25 MG (50000 UNIT) CAPS capsule Take 50,000 Units by mouth once a week. 08/17/21   [provider]      Allergies    Patient has no known allergies.    Review of Systems   Review of Systems  Constitutional:  Negative for fever.  Gastrointestinal:  Positive for abdominal pain, constipation and nausea. Negative for vomiting.  All other systems reviewed and are negative.   Physical Exam Updated Vital Signs BP (!) 137/100   Pulse 87   Temp 98.2 F (36.8 C) (Oral)   Resp 18   Ht 1.93 m (6\' 4" )   Wt 136.1 kg   SpO2 100%   BMI 36.52 kg/m  Physical Exam Vitals and nursing note reviewed.  Constitutional:      Appearance: He is well-developed. He is obese. He is not ill-appearing.  HENT:     Head: Normocephalic and atraumatic.  Eyes:     Pupils: Pupils are equal, round, and reactive to light.  Cardiovascular:     Rate and Rhythm: Normal rate and regular rhythm.  Heart sounds: Normal heart sounds. No murmur heard. Pulmonary:     Effort: Pulmonary effort is normal. No respiratory distress.     Breath sounds: Normal breath sounds. No wheezing.  Abdominal:     General: Bowel sounds are normal.     Palpations: Abdomen is soft.     Tenderness: There is generalized abdominal tenderness and tenderness in the periumbilical area. There is no guarding or rebound. Negative signs include Murphy's sign and Rovsing's sign.  Musculoskeletal:     Cervical back: Neck supple.  Lymphadenopathy:     Cervical: No cervical adenopathy.  Skin:    General: Skin is warm and dry.  Neurological:     General: No focal deficit present.     Mental Status: He is alert and  oriented to person, place, and time.     ED Results / Procedures / Treatments   Labs (all labs ordered are listed, but only abnormal results are displayed) Labs Reviewed  LIPASE, BLOOD - Abnormal; Notable for the following components:      Result Value   Lipase 65 (*)    All other components within normal limits  COMPREHENSIVE METABOLIC PANEL - Abnormal; Notable for the following components:   Glucose, Bld 396 (*)    ALT 51 (*)    All other components within normal limits  URINALYSIS, ROUTINE W REFLEX MICROSCOPIC - Abnormal; Notable for the following components:   Glucose, UA >=500 (*)    All other components within normal limits  CBC    EKG None  Radiology DG Abdomen 1 View  Result Date: 01/26/2022 CLINICAL DATA:  Abdomen pain EXAM: ABDOMEN - 1 VIEW COMPARISON:  None Available. FINDINGS: Nonobstructed gas pattern with moderate stool. Probable phleboliths in the left pelvis. IMPRESSION: Nonobstructed gas pattern Electronically Signed   By: Jasmine Pang M.D.   On: 01/26/2022 23:58    Procedures Procedures    Medications Ordered in ED Medications  morphine (PF) 4 MG/ML injection 4 mg (4 mg Intravenous Given 01/26/22 2336)  ondansetron (ZOFRAN) injection 4 mg (4 mg Intravenous Given 01/26/22 2336)    ED Course/ Medical Decision Making/ A&P                           Medical Decision Making Amount and/or Complexity of Data Reviewed Labs: ordered. Radiology: ordered.  Risk Prescription drug management.   This patient presents to the ED for concern of abdominal pain, this involves an extensive number of treatment options, and is a complaint that carries with it a high risk of complications and morbidity.  I considered the following differential and admission for this acute, potentially life threatening condition.  The differential diagnosis includes obstructive pathology, cholecystitis, pancreatitis, appendicitis, diverticulitis, UTI  MDM:    This is a 51 year old  male who presents with abdominal pain.  He is overall nontoxic and vital signs are notable for blood pressure of 137/100.  He has some generalized mild tenderness to palpation.  No focal tenderness or signs of peritonitis.  He reports he has not had a bowel movement in over 3 days.  He is low risk overall for an SBO without any surgical history.  Labs obtained.  X-ray of the abdomen was also obtained.  No obstructive pathology noted.  I independently reviewed the x-ray films and do note a significant mount of stool burden especially in the right colon.  Labs are without leukocytosis.  He has a very slight elevated lipase.  Denies any alcohol or drug use.  Otherwise LFTs are normal.  Patient was able to tolerate fluids.  Discussed with him that at this point I would not recommend CT imaging.  We will trial a course of MiraLAX to see if this helps him have a bowel movement and relieves his symptoms.  If symptoms worsen he was instructed to be reevaluated.  (Labs, imaging, consults)  Labs: I Ordered, and personally interpreted labs.  The pertinent results include: CBC, CMP, lipase, urinalysis  Imaging Studies ordered: I ordered imaging studies including KUB I independently visualized and interpreted imaging. I agree with the radiologist interpretation  Additional history obtained from chart review.  External records from outside source obtained and reviewed including prior evaluations  Cardiac Monitoring: The patient was maintained on a cardiac monitor.  I personally viewed and interpreted the cardiac monitored which showed an underlying rhythm of: Sinus rhythm  Reevaluation: After the interventions noted above, I reevaluated the patient and found that they have :improved  Social Determinants of Health: Lives independently  Disposition: Discharge  Co morbidities that complicate the patient evaluation  Past Medical History:  Diagnosis Date   Acid reflux    Diabetes mellitus without  complication (HCC)    Gout    Hemorrhoids    s/p right posterior, left lateral, right anterior banding. Last banding procedure Jan 2015.    Hx of anxiety disorder    Hypertension    Neck pain    Poor circulation    S/P tooth extraction 08/16/2016   three teeth extracted   Tremors of nervous system      Medicines Meds ordered this encounter  Medications   morphine (PF) 4 MG/ML injection 4 mg   ondansetron (ZOFRAN) injection 4 mg   polyethylene glycol (MIRALAX) 17 g packet    Sig: Take 17 g by mouth daily.    Dispense:  14 each    Refill:  0    I have reviewed the patients home medicines and have made adjustments as needed  Problem List / ED Course: Problem List Items Addressed This Visit       Other   Constipation   Other Visit Diagnoses     Generalized abdominal pain    -  Primary                   Final Clinical Impression(s) / ED Diagnoses Final diagnoses:  Generalized abdominal pain  Constipation, unspecified constipation type    Rx / DC Orders ED Discharge Orders          Ordered    polyethylene glycol (MIRALAX) 17 g packet  Daily        01/27/22 0109              Shon Baton, MD 01/27/22 [phone]

## 2022-01-27 MED ORDER — POLYETHYLENE GLYCOL 3350 17 G PO PACK: 17.0000 g | PACK | Freq: Every day | ORAL | 0 refills | Status: AC

## 2022-01-27 NOTE — Discharge Instructions (Signed)
You were seen today for abdominal pain.  Your work-up today is reassuring.  Your x-ray does support some evidence of constipation.  Trial MiraLAX daily.  You may use 1 capful daily.  If pain worsens, you develop nausea, vomiting, fevers, you should be reevaluated.

## 2022-01-31 ENCOUNTER — Ambulatory Visit (INDEPENDENT_AMBULATORY_CARE_PROVIDER_SITE_OTHER): Payer: Medicaid Other | Admitting: Internal Medicine

## 2022-01-31 ENCOUNTER — Encounter: Payer: Self-pay | Admitting: Internal Medicine

## 2022-01-31 VITALS — BP 119/81 | HR 101 | Temp 98.1°F | Ht 76.0 in | Wt 284.4 lb

## 2022-01-31 DIAGNOSIS — R151 Fecal smearing: Secondary | ICD-10-CM

## 2022-01-31 NOTE — Progress Notes (Unsigned)
Established Patient Office Visit  Subjective   Patient ID: Daniel Nicholson, male    DOB: July 08, 1970  Age: 51 y.o. MRN: [id number]  Chief Complaint  Patient presents with   Follow-up    Doing better since stopping metformin.     HPI  {History (Optional):23778}  ROS    Objective:     BP 119/81 (BP Location: Right Arm, Patient Position: Sitting, Cuff Size: Large)   Pulse (!) 101   Temp 98.1 F (36.7 C) (Oral)   Ht 6\' 4"  (1.93 m)   Wt 284 lb 6.4 oz (129 kg)   SpO2 98%   BMI 34.62 kg/m  {Vitals History (Optional):23777}  Physical Exam   No results found for any visits on 01/31/22.  {Labs (Optional):23779}  The 10-year ASCVD risk score (Arnett DK, et al., 2019) is: 25%    Assessment & Plan:   Problem List Items Addressed This Visit   None Visit Diagnoses     Fecal smearing    -  Primary       No follow-ups on file.    Eula Listen, MD    Primary Care Physician:  Donato Schultz, FNP Primary Gastroenterologist:  Dr.   Pre-Procedure History & Physical: HPI:  Daniel Nicholson is a 51 y.o. male here for   Past Medical History:  Diagnosis Date   Acid reflux    Diabetes mellitus without complication (HCC)    Gout    Hemorrhoids    s/p right posterior, left lateral, right anterior banding. Last banding procedure Jan 2015.    Hx of anxiety disorder    Hypertension    Neck pain    Poor circulation    S/P tooth extraction 08/16/2016   three teeth extracted   Tremors of nervous system     Past Surgical History:  Procedure Laterality Date   COLONOSCOPY N/A 12/16/2012   Dr. Jena Gauss- internal hemorrhoids otherwise normal. Screening in 2024.    COLONOSCOPY N/A 07/11/2017   Dr. Jena Gauss: Nonbleeding grade 1 internal hemorrhoids   FLEXIBLE SIGMOIDOSCOPY N/A 03/26/2019   Procedure: FLEXIBLE SIGMOIDOSCOPY;  Surgeon: Corbin Ade, MD;  Location: [facility];  Service: Endoscopy;  Laterality: N/A;  7:30am   HEMORRHOID BANDING     None      Prior  to Admission medications   Medication Sig Start Date End Date Taking? Authorizing Provider  albuterol (VENTOLIN HFA) 108 (90 Base) MCG/ACT inhaler Inhale 2 puffs into the lungs every 6 (six) hours as needed for wheezing or shortness of breath. 03/08/21  Yes Elson Areas, PA-C  ALLERGY RELIEF 10 MG tablet Take 10 mg by mouth every morning. 11/07/21  Yes [provider]  allopurinol (ZYLOPRIM) 300 MG tablet Take 300 mg by mouth every morning.   Yes [provider]  atorvastatin (LIPITOR) 20 MG tablet Take 20 mg by mouth daily.   Yes [provider]  Continuous Blood Gluc Sensor (FREESTYLE LIBRE 2 SENSOR) MISC CHANGE SENSOR EVERY 14oDAYS 01/27/22  Yes [provider]  glipiZIDE (GLUCOTROL) 10 MG tablet Take 10 mg by mouth 2 (two) times daily. 11/09/21  Yes [provider]  LANTUS SOLOSTAR 100 UNIT/ML Solostar Pen Inject 50 Units into the skin at bedtime. 03/12/19  Yes [provider]  lisinopril-hydrochlorothiazide (PRINZIDE,ZESTORETIC) 10-12.5 MG per tablet Take 1 tablet by mouth every morning.    Yes [provider]  LITETOUCH PEN NEEDLES 31G X 8 MM MISC USING TWICE DAILY.T 03/31/19  Yes [provider]  polyethylene glycol (MIRALAX) 17 g packet Take 17 g by mouth daily. 01/27/22  Yes Horton, Mayer Masker, MD    Allergies as of 01/31/2022   (No Known Allergies)    Family History  Problem Relation Age of Onset   Hypertension Mother    Arthritis Other    Colon cancer Neg Hx     Social History   Socioeconomic History   Marital status: Divorced    Spouse name: Not on file   Number of children: Not on file   Years of education: 12th grade   Highest education level: Not on file  Occupational History   Occupation: unemployed  Tobacco Use   Smoking status: Every Day    Packs/day: 0.50    Years: 30.00    Total pack years: 15.00    Types: Cigarettes   Smokeless tobacco: Never   Tobacco comments:    Smokes 6-7  cigarettes daily  Vaping Use   Vaping Use: Former  Substance and Sexual Activity   Alcohol use: Yes    Alcohol/week: 0.0 standard drinks of alcohol    Comment: occ. beer but rare    Drug use: No   Sexual activity: Yes    Birth control/protection: Condom  Other Topics Concern   Not on file  Social History Narrative   Not on file   Social Determinants of Health   Financial Resource Strain: Not on file  Food Insecurity: Not on file  Transportation Needs: Not on file  Physical Activity: Not on file  Stress: Not on file  Social Connections: Not on file  Intimate Partner Violence: Not on file    Review of Systems: See HPI, otherwise negative ROS  Physical Exam: BP 119/81 (BP Location: Right Arm, Patient Position: Sitting, Cuff Size: Large)   Pulse (!) 101   Temp 98.1 F (36.7 C) (Oral)   Ht 6\' 4"  (1.93 m)   Wt 284 lb 6.4 oz (129 kg)   SpO2 98%   BMI 34.62 kg/m  General:   Alert,  Well-developed, well-nourished, pleasant and cooperative in NAD Skin:  Intact without significant lesions or rashes. Eyes:  Sclera clear, no icterus.   Conjunctiva pink. Ears:  Normal auditory acuity. Nose:  No deformity, discharge,  or lesions. Mouth:  No deformity or lesions. Neck:  Supple; no masses or thyromegaly. No significant cervical adenopathy. Lungs:  Clear throughout to auscultation.   No wheezes, crackles, or rhonchi. No acute distress. Heart:  Regular rate and rhythm; no murmurs, clicks, rubs,  or gallops. Abdomen: Non-distended, normal bowel sounds.  Soft and nontender without appreciable mass or hepatosplenomegaly.  Pulses:  Normal pulses noted. Extremities:  Without clubbing or edema.  Impression/Plan:  ***     Notice: This dictation was prepared with Dragon dictation along with smaller phrase technology. Any transcriptional errors that result from this process are unintentional and may not be corrected upon review.

## 2022-01-31 NOTE — Progress Notes (Unsigned)
Primary Care Physician:  Donato Schultz, FNP Primary Gastroenterologist:  Dr. Jena Gauss  Pre-Procedure History & Physical: HPI:  Daniel Nicholson is a 51 y.o. male here for follow-up of a tendency towards loose stools with fecal seepage/leakage.  He came off metformin at my direction.  Symptoms have improved.  In fact, he was recently seen in the ED for constipation.  Had some basic lab work and plain films.  Nothing significant found.  MiraLAX prescribed.  I reviewed his ED evaluation.  He did have a slightly elevated lipase in the 60 range but nothing else to go with pancreatitis..  Since his ED visit, he has been using MiraLAX daily to every other day and having great bowel function 1-2 bowel movements daily to every other day.  Has nice solid bowel movements  - minimal straining  - no fecal seepage or leakage.  No rectal bleeding.  He is happy.  Of note, he initially started on some fiber after stopping metformin; this did not agree with him as he describes that he stopped taking it.  Patient may have a family history of polyps and multiple first-degree relatives.  He is to get more information.  Negative colonoscopy 2019.  Past Medical History:  Diagnosis Date   Acid reflux    Diabetes mellitus without complication (HCC)    Gout    Hemorrhoids    s/p right posterior, left lateral, right anterior banding. Last banding procedure Jan 2015.    Hx of anxiety disorder    Hypertension    Neck pain    Poor circulation    S/P tooth extraction 08/16/2016   three teeth extracted   Tremors of nervous system     Past Surgical History:  Procedure Laterality Date   COLONOSCOPY N/A 12/16/2012   Dr. Jena Gauss- internal hemorrhoids otherwise normal. Screening in 2024.    COLONOSCOPY N/A 07/11/2017   Dr. Jena Gauss: Nonbleeding grade 1 internal hemorrhoids   FLEXIBLE SIGMOIDOSCOPY N/A 03/26/2019   Procedure: FLEXIBLE SIGMOIDOSCOPY;  Surgeon: Corbin Ade, MD;  Location: [facility];  Service:  Endoscopy;  Laterality: N/A;  7:30am   HEMORRHOID BANDING     None      Prior to Admission medications   Medication Sig Start Date End Date Taking? Authorizing Provider  albuterol (VENTOLIN HFA) 108 (90 Base) MCG/ACT inhaler Inhale 2 puffs into the lungs every 6 (six) hours as needed for wheezing or shortness of breath. 03/08/21  Yes Elson Areas, PA-C  ALLERGY RELIEF 10 MG tablet Take 10 mg by mouth every morning. 11/07/21  Yes [provider]  allopurinol (ZYLOPRIM) 300 MG tablet Take 300 mg by mouth every morning.   Yes [provider]  atorvastatin (LIPITOR) 20 MG tablet Take 20 mg by mouth daily.   Yes [provider]  Continuous Blood Gluc Sensor (FREESTYLE LIBRE 2 SENSOR) MISC CHANGE SENSOR EVERY 14oDAYS 01/27/22  Yes [provider]  glipiZIDE (GLUCOTROL) 10 MG tablet Take 10 mg by mouth 2 (two) times daily. 11/09/21  Yes [provider]  LANTUS SOLOSTAR 100 UNIT/ML Solostar Pen Inject 50 Units into the skin at bedtime. 03/12/19  Yes [provider]  lisinopril-hydrochlorothiazide (PRINZIDE,ZESTORETIC) 10-12.5 MG per tablet Take 1 tablet by mouth every morning.    Yes [provider]  LITETOUCH PEN NEEDLES 31G X 8 MM MISC USING TWICE DAILY.T 03/31/19  Yes [provider]  polyethylene glycol (MIRALAX) 17 g packet Take 17 g by mouth daily. 01/27/22  Yes Horton, Barbette Hair, MD    Allergies as of 01/31/2022   (No Known Allergies)    Family History  Problem Relation Age of Onset   Hypertension Mother    Arthritis Other    Colon cancer Neg Hx     Social History   Socioeconomic History   Marital status: Divorced    Spouse name: Not on file   Number of children: Not on file   Years of education: 12th grade   Highest education level: Not on file  Occupational History   Occupation: unemployed  Tobacco Use   Smoking status: Every Day    Packs/day: 0.50    Years: 30.00    Total pack years: 15.00    Types:  Cigarettes   Smokeless tobacco: Never   Tobacco comments:    Smokes 6-7 cigarettes daily  Vaping Use   Vaping Use: Former  Substance and Sexual Activity   Alcohol use: Yes    Alcohol/week: 0.0 standard drinks of alcohol    Comment: occ. beer but rare    Drug use: No   Sexual activity: Yes    Birth control/protection: Condom  Other Topics Concern   Not on file  Social History Narrative   Not on file   Social Determinants of Health   Financial Resource Strain: Not on file  Food Insecurity: Not on file  Transportation Needs: Not on file  Physical Activity: Not on file  Stress: Not on file  Social Connections: Not on file  Intimate Partner Violence: Not on file    Review of Systems: See HPI, otherwise negative ROS  Physical Exam: BP 119/81 (BP Location: Right Arm, Patient Position: Sitting, Cuff Size: Large)   Pulse (!) 101   Temp 98.1 F (36.7 C) (Oral)   Ht 6\' 4"  (1.93 m)   Wt 284 lb 6.4 oz (129 kg)   SpO2 98%   BMI 34.62 kg/m  General:   Alert,  Well-developed, well-nourished, pleasant and cooperative in NAD  Impression/Plan: 51 year old gentleman with a history of fecal smearing/seepage in the setting of metformin therapy.  With cessation of metformin, his fecal seepage and leakage issues have resolved.  In fact, he has reverted back to a baseline bowel function-that of mild constipation.  No alarm features at this time.  Patient states he may have a family history of polyps in both his mom dad and possibly brother but he is going to check on that.  He had a negative colonoscopy in 2019. Recent minimally elevated lipase when he was evaluated in the ED for constipation is nonspecific in this setting and not consistent with pancreatitis  Recommendations:  bowels function seems to have nicely normalized  It is okay to take MiraLAX regularly  May take as often as once daily as needed.  Ideally, our goal is 1 bowel movement daily to every other day.  Patient may need  to be on a 5-year screening program possible family history of polyps.   Unless something comes up, we will schedule a follow-up appointment in 1 year    Notice: This dictation was prepared with Dragon dictation along with smaller phrase technology. Any transcriptional errors that result from this process are unintentional and may not be corrected upon review.

## 2022-01-31 NOTE — Patient Instructions (Signed)
It was great to see you again today!  Your bowels seem to have settled down very nicely.  It is okay to take MiraLAX regularly for the rest of your life as needed.  May take as often as once daily as needed.  Ideally, you should have a bowel movement daily to every other day.  Because of your possible family history of polyps we will consider performing a screening colonoscopy in 2024  Unless something comes up, we will schedule a follow-up appointment in 1 year

## 2022-11-05 ENCOUNTER — Encounter (HOSPITAL_COMMUNITY): Payer: Self-pay

## 2022-11-05 ENCOUNTER — Other Ambulatory Visit: Payer: Self-pay

## 2022-11-05 ENCOUNTER — Emergency Department (HOSPITAL_COMMUNITY)
Admission: EM | Admit: 2022-11-05 | Discharge: 2022-11-05 | Disposition: A | Discharge status: Home / Self Care | Payer: Medicaid Other | Attending: Emergency Medicine | Admitting: Emergency Medicine

## 2022-11-05 DIAGNOSIS — L0291 Cutaneous abscess, unspecified: Secondary | ICD-10-CM

## 2022-11-05 DIAGNOSIS — Z794 Long term (current) use of insulin: Secondary | ICD-10-CM | POA: Insufficient documentation

## 2022-11-05 DIAGNOSIS — I10 Essential (primary) hypertension: Secondary | ICD-10-CM | POA: Insufficient documentation

## 2022-11-05 DIAGNOSIS — Z79899 Other long term (current) drug therapy: Secondary | ICD-10-CM | POA: Insufficient documentation

## 2022-11-05 DIAGNOSIS — Z7984 Long term (current) use of oral hypoglycemic drugs: Secondary | ICD-10-CM | POA: Diagnosis not present

## 2022-11-05 DIAGNOSIS — E119 Type 2 diabetes mellitus without complications: Secondary | ICD-10-CM | POA: Diagnosis not present

## 2022-11-05 DIAGNOSIS — L02412 Cutaneous abscess of left axilla: Secondary | ICD-10-CM | POA: Insufficient documentation

## 2022-11-05 MED ORDER — OXYCODONE-ACETAMINOPHEN 5-325 MG PO TABS
1.0000 | ORAL_TABLET | Freq: Once | ORAL | Status: AC
  Administered 2022-11-05: 1 via ORAL
  Filled 2022-11-05: qty 1

## 2022-11-05 MED ORDER — LIDOCAINE-EPINEPHRINE (PF) 2 %-1:200000 IJ SOLN
20.0000 mL | Freq: Once | INTRAMUSCULAR | Status: AC
  Administered 2022-11-05: 20 mL
  Filled 2022-11-05: qty 20

## 2022-11-05 NOTE — ED Triage Notes (Signed)
Pt arrived via POV c/o a "boil" located in left axilla. Pt reports trying DrawOut Salve at home w/o relief. Pt also reports trying warm compresses w/o relief.

## 2022-11-05 NOTE — ED Provider Notes (Signed)
[place] EMERGENCY DEPARTMENT AT [facility] Provider Note   CSN: [id number] Arrival date & time: 11/05/22  0023     History  Chief Complaint  Patient presents with   Abscess    Daniel Nicholson is a 52 y.o. male.  HPI     This is a 52 year old male who presents with an abscess of the left axilla.  Has a history of the same.  States that it required lancing.  Has not had any fevers or systemic symptoms.  Has not noted any redness.  No spontaneous drainage.  States that this has been worsening over the last week.  Home Medications Prior to Admission medications   Medication Sig Start Date End Date Taking? Authorizing Provider  albuterol (VENTOLIN HFA) 108 (90 Base) MCG/ACT inhaler Inhale 2 puffs into the lungs every 6 (six) hours as needed for wheezing or shortness of breath. 03/08/21   Elson Areas, PA-C  ALLERGY RELIEF 10 MG tablet Take 10 mg by mouth every morning. 11/07/21   [provider]  allopurinol (ZYLOPRIM) 300 MG tablet Take 300 mg by mouth every morning.    [provider]  atorvastatin (LIPITOR) 20 MG tablet Take 20 mg by mouth daily.    [provider]  Continuous Blood Gluc Sensor (FREESTYLE LIBRE 2 SENSOR) MISC CHANGE SENSOR EVERY 14oDAYS 01/27/22   [provider]  glipiZIDE (GLUCOTROL) 10 MG tablet Take 10 mg by mouth 2 (two) times daily. 11/09/21   [provider]  LANTUS SOLOSTAR 100 UNIT/ML Solostar Pen Inject 50 Units into the skin at bedtime. 03/12/19   [provider]  lisinopril-hydrochlorothiazide (PRINZIDE,ZESTORETIC) 10-12.5 MG per tablet Take 1 tablet by mouth every morning.     [provider]  LITETOUCH PEN NEEDLES 31G X 8 MM MISC USING TWICE DAILY.T 03/31/19   [provider]  polyethylene glycol (MIRALAX) 17 g packet Take 17 g by mouth daily. 01/27/22   Satoru Milich, Mayer Masker, MD      Allergies    Patient has no known allergies.    Review of Systems   Review of  Systems  Constitutional:  Negative for fever.  Skin:        abscess  All other systems reviewed and are negative.   Physical Exam Updated Vital Signs BP (!) 135/101 (BP Location: Right Arm)   Pulse 87   Temp 98.2 F (36.8 C) (Oral)   Resp 18   Ht 1.93 m (6\' 4" )   Wt 117.9 kg   SpO2 97%   BMI 31.65 kg/m  Physical Exam Vitals and nursing note reviewed.  Constitutional:      Appearance: He is well-developed. He is obese. He is not ill-appearing.  HENT:     Head: Normocephalic and atraumatic.  Eyes:     Pupils: Pupils are equal, round, and reactive to light.  Cardiovascular:     Rate and Rhythm: Normal rate and regular rhythm.  Pulmonary:     Effort: Pulmonary effort is normal. No respiratory distress.     Breath sounds: No wheezing.  Musculoskeletal:     Cervical back: Neck supple.  Lymphadenopathy:     Cervical: No cervical adenopathy.  Skin:    General: Skin is warm and dry.     Comments: Fluctuance and induration approximately 3 x 2 cm in the left axilla with tenderness to palpation, no adjacent erythema  Neurological:     Mental Status: He is alert and oriented to person, place, and time.  Psychiatric:        Mood and Affect: Mood normal.     ED Results / Procedures / Treatments   Labs (all labs ordered are listed, but only abnormal results are displayed) Labs Reviewed - No data to display  EKG None  Radiology No results found.  Procedures .Marland KitchenIncision and Drainage  Date/Time: 11/05/2022 1:35 AM  Performed by: Shon Baton, MD Authorized by: Shon Baton, MD   Consent:    Consent obtained:  Verbal   Consent given by:  Patient   Risks, benefits, and alternatives were discussed: yes     Risks discussed:  Bleeding, incomplete drainage and pain   Alternatives discussed:  No treatment Universal protocol:    Patient identity confirmed:  Verbally with patient Location:    Type:  Abscess   Size:  3x2   Location:  Upper extremity   Upper  extremity location: Axilla. Pre-procedure details:    Skin preparation:  Chlorhexidine Sedation:    Sedation type:  None Anesthesia:    Anesthesia method:  Local infiltration   Local anesthetic:  Lidocaine 2% WITH epi Procedure type:    Complexity:  Simple Procedure details:    Ultrasound guidance: no     Needle aspiration: no     Incision types:  Stab incision   Incision depth:  Dermal   Drainage:  Purulent   Drainage amount:  Copious   Packing materials:  1/4 in gauze Post-procedure details:    Procedure completion:  Tolerated     Medications Ordered in ED Medications  lidocaine-EPINEPHrine (XYLOCAINE W/EPI) 2 %-1:200000 (PF) injection 20 mL (has no administration in time range)  oxyCODONE-acetaminophen (PERCOCET/ROXICET) 5-325 MG per tablet 1 tablet (1 tablet Oral Given 11/05/22 0103)    ED Course/ Medical Decision Making/ A&P                             Medical Decision Making Risk Prescription drug management.   This patient presents to the ED for concern of abscess, this involves an extensive number of treatment options, and is a complaint that carries with it a high risk of complications and morbidity.  I considered the following differential and admission for this acute, potentially life threatening condition.  The differential diagnosis includes abscess, cellulitis, cyst  MDM:    This is a 52 year old male who presents with an abscess to the left axilla.  He is nontoxic and vital signs are reassuring.  Denies systemic symptoms.  Clinically has a large abscess of the left axilla.  This was drained at the bedside without complication.  Packing was placed.  No adjacent erythema to suggest cellulitis.  No indication for antibiotics at this time.  Wound was packed.  They were given instructions for packing removal.  (Labs, imaging, consults)  Labs: I Ordered, and personally interpreted labs.  The pertinent results include: None  Imaging Studies ordered: I ordered  imaging studies including none I independently visualized and interpreted imaging. I agree with the radiologist interpretation  Additional history obtained from wife at bedside.  External records from outside source obtained and reviewed including prior evaluations  Cardiac Monitoring: The patient was not maintained on a cardiac monitor.  If on the cardiac monitor, I personally viewed and interpreted the cardiac monitored which showed an underlying rhythm of: N/A  Reevaluation: After the interventions noted above, I reevaluated the patient and found that they have :improved  Social Determinants of Health:  lives  independently  Disposition: Discharge  Co morbidities that complicate the patient evaluation  Past Medical History:  Diagnosis Date   Acid reflux    Diabetes mellitus without complication (HCC)    Gout    Hemorrhoids    s/p right posterior, left lateral, right anterior banding. Last banding procedure Jan 2015.    Hx of anxiety disorder    Hypertension    Neck pain    Poor circulation    S/P tooth extraction 08/16/2016   three teeth extracted   Tremors of nervous system      Medicines Meds ordered this encounter  Medications   lidocaine-EPINEPHrine (XYLOCAINE W/EPI) 2 %-1:200000 (PF) injection 20 mL   oxyCODONE-acetaminophen (PERCOCET/ROXICET) 5-325 MG per tablet 1 tablet    I have reviewed the patients home medicines and have made adjustments as needed  Problem List / ED Course: Problem List Items Addressed This Visit   None Visit Diagnoses     Abscess    -  Primary                   Final Clinical Impression(s) / ED Diagnoses Final diagnoses:  Abscess    Rx / DC Orders ED Discharge Orders     None         Shon Baton, MD 11/05/22 [phone]

## 2022-11-05 NOTE — Discharge Instructions (Signed)
You were seen today for an abscess.  This was drained and packed.  Keep packing in for the next 2 to 3 days.  If you note redness around the site or begin to have fevers or systemic symptoms, you need to be reevaluated.

## 2023-01-09 ENCOUNTER — Encounter: Payer: Self-pay | Admitting: Internal Medicine

## 2023-01-09 ENCOUNTER — Encounter: Payer: Self-pay | Admitting: *Deleted

## 2023-01-09 ENCOUNTER — Ambulatory Visit: Payer: Medicaid Other | Admitting: Internal Medicine

## 2023-01-09 ENCOUNTER — Other Ambulatory Visit: Payer: Self-pay | Admitting: *Deleted

## 2023-01-09 VITALS — BP 121/86 | HR 82 | Temp 97.7°F | Ht 76.0 in | Wt 298.2 lb

## 2023-01-09 DIAGNOSIS — L290 Pruritus ani: Secondary | ICD-10-CM | POA: Diagnosis not present

## 2023-01-09 DIAGNOSIS — R151 Fecal smearing: Secondary | ICD-10-CM | POA: Diagnosis not present

## 2023-01-09 DIAGNOSIS — Z83719 Family history of colon polyps, unspecified: Secondary | ICD-10-CM | POA: Diagnosis not present

## 2023-01-09 DIAGNOSIS — Z1211 Encounter for screening for malignant neoplasm of colon: Secondary | ICD-10-CM

## 2023-01-09 MED ORDER — PEG 3350-KCL-NA BICARB-NACL 420 G PO SOLR: 4000.0000 mL | Freq: Once | ORAL | 0 refills | Status: AC

## 2023-01-09 NOTE — Progress Notes (Signed)
Primary Care Physician:  Donato Schultz, FNP Primary Gastroenterologist:  Dr.   Pre-Procedure History & Physical: HPI:  Daniel Nicholson is a 52 y.o. male here for anorectal symptoms.  Symptoms have resolved since he came off of metformin.  In fact, he is occasionally constipated takes for which he takes MiraLAX.  No bleeding.  He is due for high risk screening colonoscopy given multiple family numbers with colon polyps.  Past Medical History:  Diagnosis Date   Acid reflux    Diabetes mellitus without complication (HCC)    Gout    Hemorrhoids    s/p right posterior, left lateral, right anterior banding. Last banding procedure Jan 2015.    Hx of anxiety disorder    Hypertension    Neck pain    Poor circulation    S/P tooth extraction 08/16/2016   three teeth extracted   Tremors of nervous system     Past Surgical History:  Procedure Laterality Date   COLONOSCOPY N/A 12/16/2012   Dr. Jena Gauss- internal hemorrhoids otherwise normal. Screening in 2024.    COLONOSCOPY N/A 07/11/2017   Dr. Jena Gauss: Nonbleeding grade 1 internal hemorrhoids   FLEXIBLE SIGMOIDOSCOPY N/A 03/26/2019   Procedure: FLEXIBLE SIGMOIDOSCOPY;  Surgeon: Corbin Ade, MD;  Location: [facility];  Service: Endoscopy;  Laterality: N/A;  7:30am   HEMORRHOID BANDING     None      Prior to Admission medications   Medication Sig Start Date End Date Taking? Authorizing Provider  albuterol (VENTOLIN HFA) 108 (90 Base) MCG/ACT inhaler Inhale 2 puffs into the lungs every 6 (six) hours as needed for wheezing or shortness of breath. 03/08/21  Yes Elson Areas, PA-C  ALLERGY RELIEF 10 MG tablet Take 10 mg by mouth every morning. 11/07/21  Yes [provider]  allopurinol (ZYLOPRIM) 300 MG tablet Take 300 mg by mouth every morning.   Yes [provider]  atorvastatin (LIPITOR) 20 MG tablet Take 20 mg by mouth daily.   Yes [provider]  Continuous Blood Gluc Sensor (FREESTYLE LIBRE 2  SENSOR) MISC CHANGE SENSOR EVERY 14oDAYS 01/27/22  Yes [provider]  gabapentin (NEURONTIN) 100 MG capsule Take by mouth. 12/20/22  Yes [provider]  glipiZIDE (GLUCOTROL) 10 MG tablet Take 10 mg by mouth 2 (two) times daily. 11/09/21  Yes [provider]  HUMULIN 70/30 KWIKPEN (70-30) 100 UNIT/ML KwikPen SMARTSIG:45 Unit(s) SUB-Q Twice Daily   Yes [provider]  LANTUS SOLOSTAR 100 UNIT/ML Solostar Pen Inject 55 Units into the skin at bedtime. 03/12/19  Yes [provider]  lisinopril-hydrochlorothiazide (PRINZIDE,ZESTORETIC) 10-12.5 MG per tablet Take 1 tablet by mouth every morning.    Yes [provider]  LITETOUCH PEN NEEDLES 31G X 8 MM MISC USING TWICE DAILY.T 03/31/19  Yes [provider]  polyethylene glycol (MIRALAX) 17 g packet Take 17 g by mouth daily. 01/27/22  Yes Horton, Mayer Masker, MD  Semaglutide, 2 MG/DOSE, 8 MG/3ML SOPN Inject into the skin. 12/20/22  Yes [provider]    Allergies as of 01/09/2023   (No Known Allergies)    Family History  Problem Relation Age of Onset   Hypertension Mother    Arthritis Other    Colon cancer Neg Hx     Social History   Socioeconomic History   Marital status: Divorced    Spouse name: Not on file   Number of children: Not on file   Years of education: 12th grade  Highest education level: Not on file  Occupational History   Occupation: unemployed  Tobacco Use   Smoking status: Every Day    Current packs/day: 0.50    Average packs/day: 0.5 packs/day for 39.7 years (19.9 ttl pk-yrs)    Types: Cigarettes    Start date: 61   Smokeless tobacco: Never   Tobacco comments:    Smokes 6-7 cigarettes daily  Vaping Use   Vaping status: Former  Substance and Sexual Activity   Alcohol use: Yes    Alcohol/week: 0.0 standard drinks of alcohol    Comment: occ. beer but rare    Drug use: No   Sexual activity: Yes    Birth control/protection: Condom  Other  Topics Concern   Not on file  Social History Narrative   Not on file   Social Determinants of Health   Financial Resource Strain: Not on File (07/28/2021)   Received from [employer], [employer]    Financial Resource Strain: 0  Food Insecurity: Not on File (01/04/2023)   Received from [employer]    Food: 0  Transportation Needs: Not on File (07/28/2021)   Received from [employer], [employer] Needs    Transportation: 0  Physical Activity: Not on File (07/28/2021)   Received from [place], [place]   Physical Activity    Physical Activity: 0  Stress: Not on File (07/28/2021)   Received from [facility], [place]   Stress    Stress: 0  Social Connections: Not on File (12/21/2022)   Received from [employer]   Social Connections    Connectedness: 0  Intimate Partner Violence: Not on file    Review of Systems: See HPI, otherwise negative ROS  Physical Exam: BP 121/86 (BP Location: Right Arm, Patient Position: Sitting, Cuff Size: Large)   Pulse 82   Temp 97.7 F (36.5 C) (Oral)   Ht 6\' 4"  (1.93 m)   Wt 298 lb 3.2 oz (135.3 kg)   SpO2 97%   BMI 36.30 kg/m  General:   Alert,  Well-developed, well-nourished, pleasant and cooperative in NAD Neck:  Supple; no masses or thyromegaly. No significant cervical adenopathy. Lungs:  Clear throughout to auscultation.   No wheezes, crackles, or rhonchi. No acute distress. Heart:  Regular rate and rhythm; no murmurs, clicks, rubs,  or gallops. Abdomen: Non-distended, normal bowel sounds.  Soft and nontender without appreciable mass or hepatosplenomegaly.   Impression/Plan: Very pleasant 52 year old gentleman with a history of pruritus ani and fecal smearing.  He has come off of metformin though symptoms have resolved.  If anything his baseline bowel function is normal with occasional constipation very effectively managed with MiraLAX.  Multiple family members with colonic polyps.  Patient is due for a high risk screening  colonoscopy at this time.   As discussed,  a high risk screening colonoscopy (multiple family members with colon polyps) is indicated..  ASA 2.  The risks, benefits, limitations, alternatives and imponderables have been reviewed with the patient. Questions have been answered. All parties are agreeable.    Modification of diabetes medications per protocol.  Further recommendations to follow.      Notice: This dictation was prepared with Dragon dictation along with smaller phrase technology. Any transcriptional errors that result from this process are unintentional and may not be corrected upon review.

## 2023-01-09 NOTE — H&P (View-Only) (Signed)
Primary Care Physician:  Donato Schultz, FNP Primary Gastroenterologist:  Dr.   Pre-Procedure History & Physical: HPI:  Daniel Nicholson is a 52 y.o. male here for anorectal symptoms.  Symptoms have resolved since he came off of metformin.  In fact, he is occasionally constipated takes for which he takes MiraLAX.  No bleeding.  He is due for high risk screening colonoscopy given multiple family numbers with colon polyps.  Past Medical History:  Diagnosis Date   Acid reflux    Diabetes mellitus without complication (HCC)    Gout    Hemorrhoids    s/p right posterior, left lateral, right anterior banding. Last banding procedure Jan 2015.    Hx of anxiety disorder    Hypertension    Neck pain    Poor circulation    S/P tooth extraction 08/16/2016   three teeth extracted   Tremors of nervous system     Past Surgical History:  Procedure Laterality Date   COLONOSCOPY N/A 12/16/2012   Dr. Jena Gauss- internal hemorrhoids otherwise normal. Screening in 2024.    COLONOSCOPY N/A 07/11/2017   Dr. Jena Gauss: Nonbleeding grade 1 internal hemorrhoids   FLEXIBLE SIGMOIDOSCOPY N/A 03/26/2019   Procedure: FLEXIBLE SIGMOIDOSCOPY;  Surgeon: Corbin Ade, MD;  Location: [facility];  Service: Endoscopy;  Laterality: N/A;  7:30am   HEMORRHOID BANDING     None      Prior to Admission medications   Medication Sig Start Date End Date Taking? Authorizing Provider  albuterol (VENTOLIN HFA) 108 (90 Base) MCG/ACT inhaler Inhale 2 puffs into the lungs every 6 (six) hours as needed for wheezing or shortness of breath. 03/08/21  Yes Elson Areas, PA-C  ALLERGY RELIEF 10 MG tablet Take 10 mg by mouth every morning. 11/07/21  Yes [provider]  allopurinol (ZYLOPRIM) 300 MG tablet Take 300 mg by mouth every morning.   Yes [provider]  atorvastatin (LIPITOR) 20 MG tablet Take 20 mg by mouth daily.   Yes [provider]  Continuous Blood Gluc Sensor (FREESTYLE LIBRE 2  SENSOR) MISC CHANGE SENSOR EVERY 14oDAYS 01/27/22  Yes [provider]  gabapentin (NEURONTIN) 100 MG capsule Take by mouth. 12/20/22  Yes [provider]  glipiZIDE (GLUCOTROL) 10 MG tablet Take 10 mg by mouth 2 (two) times daily. 11/09/21  Yes [provider]  HUMULIN 70/30 KWIKPEN (70-30) 100 UNIT/ML KwikPen SMARTSIG:45 Unit(s) SUB-Q Twice Daily   Yes [provider]  LANTUS SOLOSTAR 100 UNIT/ML Solostar Pen Inject 55 Units into the skin at bedtime. 03/12/19  Yes [provider]  lisinopril-hydrochlorothiazide (PRINZIDE,ZESTORETIC) 10-12.5 MG per tablet Take 1 tablet by mouth every morning.    Yes [provider]  LITETOUCH PEN NEEDLES 31G X 8 MM MISC USING TWICE DAILY.T 03/31/19  Yes [provider]  polyethylene glycol (MIRALAX) 17 g packet Take 17 g by mouth daily. 01/27/22  Yes Horton, Mayer Masker, MD  Semaglutide, 2 MG/DOSE, 8 MG/3ML SOPN Inject into the skin. 12/20/22  Yes [provider]    Allergies as of 01/09/2023   (No Known Allergies)    Family History  Problem Relation Age of Onset   Hypertension Mother    Arthritis Other    Colon cancer Neg Hx     Social History   Socioeconomic History   Marital status: Divorced    Spouse name: Not on file   Number of children: Not on file   Years of education: 12th grade  Highest education level: Not on file  Occupational History   Occupation: unemployed  Tobacco Use   Smoking status: Every Day    Current packs/day: 0.50    Average packs/day: 0.5 packs/day for 39.7 years (19.9 ttl pk-yrs)    Types: Cigarettes    Start date: 61   Smokeless tobacco: Never   Tobacco comments:    Smokes 6-7 cigarettes daily  Vaping Use   Vaping status: Former  Substance and Sexual Activity   Alcohol use: Yes    Alcohol/week: 0.0 standard drinks of alcohol    Comment: occ. beer but rare    Drug use: No   Sexual activity: Yes    Birth control/protection: Condom  Other  Topics Concern   Not on file  Social History Narrative   Not on file   Social Determinants of Health   Financial Resource Strain: Not on File (07/28/2021)   Received from [employer], [employer]    Financial Resource Strain: 0  Food Insecurity: Not on File (01/04/2023)   Received from [employer]    Food: 0  Transportation Needs: Not on File (07/28/2021)   Received from [employer], [employer] Needs    Transportation: 0  Physical Activity: Not on File (07/28/2021)   Received from [place], [place]   Physical Activity    Physical Activity: 0  Stress: Not on File (07/28/2021)   Received from [facility], [place]   Stress    Stress: 0  Social Connections: Not on File (12/21/2022)   Received from [employer]   Social Connections    Connectedness: 0  Intimate Partner Violence: Not on file    Review of Systems: See HPI, otherwise negative ROS  Physical Exam: BP 121/86 (BP Location: Right Arm, Patient Position: Sitting, Cuff Size: Large)   Pulse 82   Temp 97.7 F (36.5 C) (Oral)   Ht 6\' 4"  (1.93 m)   Wt 298 lb 3.2 oz (135.3 kg)   SpO2 97%   BMI 36.30 kg/m  General:   Alert,  Well-developed, well-nourished, pleasant and cooperative in NAD Neck:  Supple; no masses or thyromegaly. No significant cervical adenopathy. Lungs:  Clear throughout to auscultation.   No wheezes, crackles, or rhonchi. No acute distress. Heart:  Regular rate and rhythm; no murmurs, clicks, rubs,  or gallops. Abdomen: Non-distended, normal bowel sounds.  Soft and nontender without appreciable mass or hepatosplenomegaly.   Impression/Plan: Very pleasant 52 year old gentleman with a history of pruritus ani and fecal smearing.  He has come off of metformin though symptoms have resolved.  If anything his baseline bowel function is normal with occasional constipation very effectively managed with MiraLAX.  Multiple family members with colonic polyps.  Patient is due for a high risk screening  colonoscopy at this time.   As discussed,  a high risk screening colonoscopy (multiple family members with colon polyps) is indicated..  ASA 2.  The risks, benefits, limitations, alternatives and imponderables have been reviewed with the patient. Questions have been answered. All parties are agreeable.    Modification of diabetes medications per protocol.  Further recommendations to follow.      Notice: This dictation was prepared with Dragon dictation along with smaller phrase technology. Any transcriptional errors that result from this process are unintentional and may not be corrected upon review.

## 2023-01-09 NOTE — Patient Instructions (Signed)
It was good to see you again today!  I am glad your anorectal symptoms have resolved  As discussed, you need to have a high risk screening colonoscopy (multiple family members with colon polyps).  ASA 2.  Modification of diabetes medications per protocol.  Further recommendations to follow.

## 2023-01-09 NOTE — Addendum Note (Signed)
Addended by: Elinor Dodge on: 01/09/2023 01:32 PM   Modules accepted: Orders

## 2023-01-17 ENCOUNTER — Other Ambulatory Visit (HOSPITAL_COMMUNITY)
Admission: RE | Admit: 2023-01-17 | Discharge: 2023-01-17 | Disposition: A | Discharge status: Home / Self Care | Payer: Medicaid Other | Source: Ambulatory Visit | Attending: Internal Medicine | Admitting: Internal Medicine

## 2023-01-17 DIAGNOSIS — Z1211 Encounter for screening for malignant neoplasm of colon: Secondary | ICD-10-CM | POA: Insufficient documentation

## 2023-01-17 DIAGNOSIS — Z83719 Family history of colon polyps, unspecified: Secondary | ICD-10-CM | POA: Diagnosis present

## 2023-01-17 LAB — BASIC METABOLIC PANEL
Anion gap: 11 (ref 5–15)
BUN: 15 mg/dL (ref 6–20)
CO2: 26 mmol/L (ref 22–32)
Calcium: 9.3 mg/dL (ref 8.9–10.3)
Chloride: 98 mmol/L (ref 98–111)
Creatinine, Ser: 1.27 mg/dL — ABNORMAL HIGH (ref 0.61–1.24)
GFR, Estimated: >60 mL/min (ref >60)
Glucose, Bld: 126 mg/dL — ABNORMAL HIGH (ref 70–99)
Potassium: 3.3 mmol/L — ABNORMAL LOW (ref 3.5–5.1)
Sodium: 135 mmol/L (ref 135–145)

## 2023-01-24 ENCOUNTER — Encounter: Payer: Self-pay | Admitting: *Deleted

## 2023-01-24 ENCOUNTER — Telehealth: Payer: Self-pay | Admitting: *Deleted

## 2023-01-24 NOTE — Telephone Encounter (Signed)
From: Guy Begin, RN  Sent: 01/24/2023  12:29 PM EDT  To: Ferne Reus   Patient took ozempic on Monday 10/14. It ws supposed to be stopped 7 days prior to procedure . Patients procedure for 10/17 must be rescheduled per anesthesia

## 2023-01-24 NOTE — Telephone Encounter (Signed)
Spoke with pt. Rescheduled to 10/28 at 8:15am. He is aware no ozempic starting 10/21. He voiced understanding. I advised him will send instructions to his mychart since he would not get them in time in mail. He stated he did have access to this. I again had patient repeat back to me when he would need to stop his ozempic.

## 2023-02-02 ENCOUNTER — Ambulatory Visit: Payer: Medicaid Other | Admitting: Internal Medicine

## 2023-02-05 ENCOUNTER — Other Ambulatory Visit: Payer: Self-pay

## 2023-02-05 ENCOUNTER — Ambulatory Visit (HOSPITAL_COMMUNITY)
Admission: RE | Admit: 2023-02-05 | Discharge: 2023-02-05 | Disposition: A | Discharge status: Home / Self Care | Payer: Medicaid Other | Attending: Internal Medicine | Admitting: Internal Medicine

## 2023-02-05 ENCOUNTER — Ambulatory Visit (HOSPITAL_COMMUNITY): Payer: Medicaid Other | Admitting: Certified Registered"

## 2023-02-05 ENCOUNTER — Encounter (HOSPITAL_COMMUNITY): Payer: Self-pay | Admitting: Internal Medicine

## 2023-02-05 ENCOUNTER — Encounter (HOSPITAL_COMMUNITY)
Admission: RE | Disposition: A | Discharge status: Home / Self Care | Payer: Self-pay | Source: Home / Self Care | Attending: Internal Medicine

## 2023-02-05 DIAGNOSIS — Z1211 Encounter for screening for malignant neoplasm of colon: Secondary | ICD-10-CM | POA: Insufficient documentation

## 2023-02-05 DIAGNOSIS — E119 Type 2 diabetes mellitus without complications: Secondary | ICD-10-CM | POA: Insufficient documentation

## 2023-02-05 DIAGNOSIS — Z794 Long term (current) use of insulin: Secondary | ICD-10-CM | POA: Diagnosis not present

## 2023-02-05 DIAGNOSIS — Z7984 Long term (current) use of oral hypoglycemic drugs: Secondary | ICD-10-CM | POA: Insufficient documentation

## 2023-02-05 DIAGNOSIS — Z83719 Family history of colon polyps, unspecified: Secondary | ICD-10-CM | POA: Insufficient documentation

## 2023-02-05 DIAGNOSIS — K5730 Diverticulosis of large intestine without perforation or abscess without bleeding: Secondary | ICD-10-CM

## 2023-02-05 DIAGNOSIS — I10 Essential (primary) hypertension: Secondary | ICD-10-CM | POA: Diagnosis not present

## 2023-02-05 DIAGNOSIS — Z8249 Family history of ischemic heart disease and other diseases of the circulatory system: Secondary | ICD-10-CM | POA: Insufficient documentation

## 2023-02-05 DIAGNOSIS — F17210 Nicotine dependence, cigarettes, uncomplicated: Secondary | ICD-10-CM | POA: Insufficient documentation

## 2023-02-05 HISTORY — PX: COLONOSCOPY WITH PROPOFOL: SHX5780

## 2023-02-05 LAB — GLUCOSE, CAPILLARY: Glucose-Capillary: 104 mg/dL — ABNORMAL HIGH (ref 70–99)

## 2023-02-05 SURGERY — COLONOSCOPY WITH PROPOFOL
Anesthesia: General

## 2023-02-05 MED ORDER — SODIUM CHLORIDE 0.9% FLUSH: 10.0000 mL | Freq: Two times a day (BID) | INTRAVENOUS | Status: DC

## 2023-02-05 MED ORDER — PROPOFOL 1000 MG/100ML IV EMUL
INTRAVENOUS | Status: AC
  Filled 2023-02-05: qty 100

## 2023-02-05 MED ORDER — STERILE WATER FOR IRRIGATION IR SOLN
Status: DC | PRN
  Administered 2023-02-05: 100 mL

## 2023-02-05 MED ORDER — LIDOCAINE HCL (CARDIAC) PF 100 MG/5ML IV SOSY
PREFILLED_SYRINGE | INTRAVENOUS | Status: DC | PRN
  Administered 2023-02-05: 80 mg via INTRAVENOUS

## 2023-02-05 MED ORDER — LACTATED RINGERS IV SOLN: INTRAVENOUS | Status: DC | PRN

## 2023-02-05 MED ORDER — PROPOFOL 10 MG/ML IV BOLUS
INTRAVENOUS | Status: DC | PRN
  Administered 2023-02-05: 30 mg via INTRAVENOUS
  Administered 2023-02-05: 80 mg via INTRAVENOUS

## 2023-02-05 MED ORDER — DEXMEDETOMIDINE HCL IN NACL 80 MCG/20ML IV SOLN
INTRAVENOUS | Status: DC | PRN
  Administered 2023-02-05: 12 ug via INTRAVENOUS

## 2023-02-05 MED ORDER — PROPOFOL 500 MG/50ML IV EMUL
INTRAVENOUS | Status: DC | PRN
  Administered 2023-02-05: 150 ug/kg/min via INTRAVENOUS

## 2023-02-05 NOTE — Discharge Instructions (Addendum)
  Colonoscopy Discharge Instructions  Read the instructions outlined below and refer to this sheet in the next few weeks. These discharge instructions provide you with general information on caring for yourself after you leave the hospital. Your doctor may also give you specific instructions. While your treatment has been planned according to the most current medical practices available, unavoidable complications occasionally occur. If you have any problems or questions after discharge, call Dr. Jena Gauss at [phone]. ACTIVITY You may resume your regular activity, but move at a slower pace for the next 24 hours.  Take frequent rest periods for the next 24 hours.  Walking will help get rid of the air and reduce the bloated feeling in your belly (abdomen).  No driving for 24 hours (because of the medicine (anesthesia) used during the test).   Do not sign any important legal documents or operate any machinery for 24 hours (because of the anesthesia used during the test).  NUTRITION Drink plenty of fluids.  You may resume your normal diet as instructed by your doctor.  Begin with a light meal and progress to your normal diet. Heavy or fried foods are harder to digest and may make you feel sick to your stomach (nauseated).  Avoid alcoholic beverages for 24 hours or as instructed.  MEDICATIONS You may resume your normal medications unless your doctor tells you otherwise.  WHAT YOU CAN EXPECT TODAY Some feelings of bloating in the abdomen.  Passage of more gas than usual.  Spotting of blood in your stool or on the toilet paper.  IF YOU HAD POLYPS REMOVED DURING THE COLONOSCOPY: No aspirin products for 7 days or as instructed.  No alcohol for 7 days or as instructed.  Eat a soft diet for the next 24 hours.  FINDING OUT THE RESULTS OF YOUR TEST Not all test results are available during your visit. If your test results are not back during the visit, make an appointment with your caregiver to find out the  results. Do not assume everything is normal if you have not heard from your caregiver or the medical facility. It is important for you to follow up on all of your test results.  SEEK IMMEDIATE MEDICAL ATTENTION IF: You have more than a spotting of blood in your stool.  Your belly is swollen (abdominal distention).  You are nauseated or vomiting.  You have a temperature over 101.  You have abdominal pain or discomfort that is severe or gets worse throughout the day.     diverticulosis only found (no polyps)   diverticulosis information provided  It is recommended you return in 5 years for repeat colonoscopy  At patient request, I called Raymond at [phone] findings and recommendations

## 2023-02-05 NOTE — Plan of Care (Signed)
 [facility] Tonsillectomy/Adenoidectomy, Postoperative PEDS care plan entered in error.

## 2023-02-05 NOTE — Transfer of Care (Addendum)
Immediate Anesthesia Transfer of Care Note  Patient: Daniel Nicholson  Procedure(s) Performed: COLONOSCOPY WITH PROPOFOL  Patient Location: PACU and Endoscopy Unit  Anesthesia Type:General  Level of Consciousness: drowsy and patient cooperative  Airway & Oxygen Therapy: Patient Spontanous Breathing and Patient connected to face mask oxygen  Post-op Assessment: Report given to RN and Post -op Vital signs reviewed and stable  Post vital signs: Reviewed and stable  Last Vitals:  Vitals Value Taken Time  BP 98/62 02/05/23   0852  Temp 36.7 02/05/23   0852  Pulse 78 02/05/23   0852  Resp 17 02/05/23   0852  SpO2 98% 02/05/23   0852    Last Pain:  Vitals:   02/05/23 0829  TempSrc:   PainSc: 0-No pain      Patients Stated Pain Goal: 8 (02/05/23 0722)  Complications: No notable events documented.

## 2023-02-05 NOTE — Anesthesia Postprocedure Evaluation (Signed)
Anesthesia Post Note  Patient: Daniel Nicholson  Procedure(s) Performed: COLONOSCOPY WITH PROPOFOL  Patient location during evaluation: Phase II Anesthesia Type: General Level of consciousness: awake Pain management: pain level controlled Vital Signs Assessment: post-procedure vital signs reviewed and stable Respiratory status: spontaneous breathing and respiratory function stable Cardiovascular status: blood pressure returned to baseline and stable Postop Assessment: no headache and no apparent nausea or vomiting Anesthetic complications: no Comments: Late entry   No notable events documented.   Last Vitals:  Vitals:   02/05/23 0852 02/05/23 0857  BP: 98/62 99/67  Pulse: 78 81  Resp: 17   Temp: 36.7 C   SpO2: 98% 96%    Last Pain:  Vitals:   02/05/23 0857  TempSrc:   PainSc: 0-No pain                 Windell Norfolk

## 2023-02-05 NOTE — Anesthesia Preprocedure Evaluation (Signed)
Anesthesia Evaluation  Patient identified by MRN, date of birth, ID band Patient awake    Reviewed: Allergy & Precautions, H&P , NPO status , Patient's Chart, lab work & pertinent test results, reviewed documented beta blocker date and time   Airway Mallampati: II  TM Distance: >3 FB Neck ROM: full    Dental no notable dental hx.    Pulmonary neg pulmonary ROS, Current Smoker   Pulmonary exam normal breath sounds clear to auscultation       Cardiovascular Exercise Tolerance: Good hypertension, negative cardio ROS  Rhythm:regular Rate:Normal     Neuro/Psych  Neuromuscular disease negative neurological ROS  negative psych ROS   GI/Hepatic negative GI ROS, Neg liver ROS,GERD  ,,  Endo/Other  negative endocrine ROSdiabetes    Renal/GU Renal diseasenegative Renal ROS  negative genitourinary   Musculoskeletal   Abdominal   Peds  Hematology negative hematology ROS (+)   Anesthesia Other Findings   Reproductive/Obstetrics negative OB ROS                             Anesthesia Physical Anesthesia Plan  ASA: 2  Anesthesia Plan: General   Post-op Pain Management:    Induction:   PONV Risk Score and Plan: Propofol infusion  Airway Management Planned:   Additional Equipment:   Intra-op Plan:   Post-operative Plan:   Informed Consent: I have reviewed the patients History and Physical, chart, labs and discussed the procedure including the risks, benefits and alternatives for the proposed anesthesia with the patient or authorized representative who has indicated his/her understanding and acceptance.     Dental Advisory Given  Plan Discussed with: CRNA  Anesthesia Plan Comments:        Anesthesia Quick Evaluation

## 2023-02-05 NOTE — Interval H&P Note (Signed)
History and Physical Interval Note:  02/05/2023 8:22 AM  Daniel Nicholson  has presented today for surgery, with the diagnosis of SCREENING, FHX: POLYPS.  The various methods of treatment have been discussed with the patient and family. After consideration of risks, benefits and other options for treatment, the patient has consented to  Procedure(s) with comments: COLONOSCOPY WITH PROPOFOL (N/A) - 12:30 PM, ASA 2 as a surgical intervention.  The patient's history has been reviewed, patient examined, no change in status, stable for surgery.  I have reviewed the patient's chart and labs.  Questions were answered to the patient's satisfaction.     Nigel Ericsson  No change.  High rescreening colonoscopy today per plan.  The risks, benefits, limitations, alternatives and imponderables have been reviewed with the patient. Questions have been answered. All parties are agreeable.

## 2023-02-05 NOTE — Op Note (Signed)
[facility] Patient Name: Daniel Nicholson Procedure Date: 02/05/2023 8:04 AM MRN: [id number] Date of Birth: Jul 25, 1970 Attending MD: Gennette Pac , MD, [id number] CSN: [id number] Age: 52 Admit Type: Outpatient Procedure:                Colonoscopy Indications:              Colon cancer screening in patient at increased                            risk: Family history of 1st-degree relative with                            colon polyps Providers:                Gennette Pac, MD, Angelica Ran, Dyann Ruddle Referring MD:              Medicines:                Propofol per Anesthesia Complications:            No immediate complications. Estimated Blood Loss:     Estimated blood loss: none. Procedure:                Pre-Anesthesia Assessment:                           - Prior to the procedure, a History and Physical                            was performed, and patient medications and                            allergies were reviewed. The patient's tolerance of                            previous anesthesia was also reviewed. The risks                            and benefits of the procedure and the sedation                            options and risks were discussed with the patient.                            All questions were answered, and informed consent                            was obtained. Prior Anticoagulants: The patient has                            taken no anticoagulant or antiplatelet agents. ASA                            Grade Assessment: II - A patient with mild systemic  disease. After reviewing the risks and benefits,                            the patient was deemed in satisfactory condition to                            undergo the procedure.                           After obtaining informed consent, the colonoscope                            was passed under direct vision. Throughout the                             procedure, the patient's blood pressure, pulse, and                            oxygen saturations were monitored continuously. The                            [id number]) scope was introduced through the                            anus and advanced to the the cecum, identified by                            appendiceal orifice and ileocecal valve. The                            colonoscopy was performed without difficulty. The                            patient tolerated the procedure well. The quality                            of the bowel preparation was adequate. The                            ileocecal valve, appendiceal orifice, and rectum                            were photographed. Scope In: 8:34:41 AM Scope Out: 8:47:41 AM Scope Withdrawal Time: 0 hours 7 minutes 39 seconds  Total Procedure Duration: 0 hours 13 minutes 0 seconds  Findings:      The perianal and digital rectal examinations were normal.      A few medium-mouthed diverticula were found in the sigmoid colon and       descending colon.      The exam was otherwise without abnormality on direct and retroflexion       views. Impression:               - Diverticulosis in the sigmoid colon and in the  descending colon.                           - The examination was otherwise normal on direct                            and retroflexion views.                           - No specimens collected. Moderate Sedation:      Moderate (conscious) sedation was personally administered by an       anesthesia professional. The following parameters were monitored: oxygen       saturation, heart rate, blood pressure, respiratory rate, EKG, adequacy       of pulmonary ventilation, and response to care. Recommendation:           - Patient has a contact number available for                            emergencies. The signs and symptoms of potential                            delayed complications were  discussed with the                            patient. Return to normal activities tomorrow.                            Written discharge instructions were provided to the                            patient.                           - Advance diet as tolerated.                           - Continue present medications.                           - Repeat colonoscopy in 10 years for screening                            purposes.                           - Return to GI office (date not yet determined). Procedure Code(s):        --- Professional ---                           [phone], Colonoscopy, flexible; diagnostic, including                            collection of specimen(s) by brushing or washing,                            when performed (separate  procedure) Diagnosis Code(s):        --- Professional ---                           Z83.71, Family history of colonic polyps                           K57.30, Diverticulosis of large intestine without                            perforation or abscess without bleeding CPT copyright 2022 American [employer] Association. All rights reserved. The codes documented in this report are preliminary and upon coder review may  be revised to meet current compliance requirements. Gerrit Friends. Ignatius Kloos, MD Gennette Pac, MD 02/05/2023 8:56:41 AM This report has been signed electronically. Number of Addenda: 0

## 2023-02-09 ENCOUNTER — Encounter (HOSPITAL_COMMUNITY): Payer: Self-pay | Admitting: Internal Medicine

## 2023-02-17 ENCOUNTER — Other Ambulatory Visit: Payer: Self-pay

## 2023-02-17 ENCOUNTER — Encounter (HOSPITAL_COMMUNITY): Payer: Self-pay | Admitting: Emergency Medicine

## 2023-02-17 ENCOUNTER — Emergency Department (HOSPITAL_COMMUNITY)
Admission: EM | Admit: 2023-02-17 | Discharge: 2023-02-17 | Disposition: A | Discharge status: Home / Self Care | Payer: Medicaid Other | Attending: Emergency Medicine | Admitting: Emergency Medicine

## 2023-02-17 DIAGNOSIS — E119 Type 2 diabetes mellitus without complications: Secondary | ICD-10-CM | POA: Insufficient documentation

## 2023-02-17 DIAGNOSIS — F410 Panic disorder [episodic paroxysmal anxiety] without agoraphobia: Secondary | ICD-10-CM | POA: Insufficient documentation

## 2023-02-17 DIAGNOSIS — R748 Abnormal levels of other serum enzymes: Secondary | ICD-10-CM | POA: Insufficient documentation

## 2023-02-17 DIAGNOSIS — Z79899 Other long term (current) drug therapy: Secondary | ICD-10-CM | POA: Insufficient documentation

## 2023-02-17 DIAGNOSIS — I10 Essential (primary) hypertension: Secondary | ICD-10-CM | POA: Insufficient documentation

## 2023-02-17 DIAGNOSIS — F419 Anxiety disorder, unspecified: Secondary | ICD-10-CM

## 2023-02-17 DIAGNOSIS — Z7984 Long term (current) use of oral hypoglycemic drugs: Secondary | ICD-10-CM | POA: Insufficient documentation

## 2023-02-17 LAB — BASIC METABOLIC PANEL
Anion gap: 10 (ref 5–15)
BUN: 15 mg/dL (ref 6–20)
CO2: 24 mmol/L (ref 22–32)
Calcium: 8.9 mg/dL (ref 8.9–10.3)
Chloride: 102 mmol/L (ref 98–111)
Creatinine, Ser: 1.23 mg/dL (ref 0.61–1.24)
GFR, Estimated: >60 mL/min (ref >60)
Glucose, Bld: 202 mg/dL — ABNORMAL HIGH (ref 70–99)
Potassium: 3.3 mmol/L — ABNORMAL LOW (ref 3.5–5.1)
Sodium: 136 mmol/L (ref 135–145)

## 2023-02-17 LAB — CBC WITH DIFFERENTIAL/PLATELET
Abs Immature Granulocytes: 0.01 10*3/uL (ref 0.00–0.07)
Basophils Absolute: 0.1 10*3/uL (ref 0.0–0.1)
Basophils Relative: 1 %
Eosinophils Absolute: 0.6 10*3/uL — ABNORMAL HIGH (ref 0.0–0.5)
Eosinophils Relative: 10 %
HCT: 45.2 % (ref 39.0–52.0)
Hemoglobin: 15 g/dL (ref 13.0–17.0)
Immature Granulocytes: 0 %
Lymphocytes Relative: 48 %
Lymphs Abs: 3.1 10*3/uL (ref 0.7–4.0)
MCH: 28.9 pg (ref 26.0–34.0)
MCHC: 33.2 g/dL (ref 30.0–36.0)
MCV: 87.1 fL (ref 80.0–100.0)
Monocytes Absolute: 0.5 10*3/uL (ref 0.1–1.0)
Monocytes Relative: 7 %
Neutro Abs: 2.2 10*3/uL (ref 1.7–7.7)
Neutrophils Relative %: 34 %
Platelets: 202 10*3/uL (ref 150–400)
RBC: 5.19 MIL/uL (ref 4.22–5.81)
RDW: 13.2 % (ref 11.5–15.5)
WBC: 6.4 10*3/uL (ref 4.0–10.5)
nRBC: 0 % (ref 0.0–0.2)

## 2023-02-17 LAB — TROPONIN I (HIGH SENSITIVITY)
Troponin I (High Sensitivity): 5 ng/L (ref <18)
Troponin I (High Sensitivity): 4 ng/L (ref <18)

## 2023-02-17 LAB — CBG MONITORING, ED: Glucose-Capillary: 177 mg/dL — ABNORMAL HIGH (ref 70–99)

## 2023-02-17 LAB — CK: Total CK: 886 U/L — ABNORMAL HIGH (ref 49–397)

## 2023-02-17 MED ORDER — ONDANSETRON HCL 4 MG/2ML IJ SOLN
4.0000 mg | Freq: Once | INTRAMUSCULAR | Status: AC
Start: 2023-02-17 — End: 2023-02-17
  Administered 2023-02-17: 4 mg via INTRAVENOUS
  Filled 2023-02-17: qty 2

## 2023-02-17 MED ORDER — DIAZEPAM 5 MG/ML IJ SOLN
2.5000 mg | Freq: Once | INTRAMUSCULAR | Status: AC
  Administered 2023-02-17: 2.5 mg via INTRAVENOUS
  Filled 2023-02-17: qty 2

## 2023-02-17 NOTE — ED Triage Notes (Addendum)
Pt states he thinks he is having a panic attack. States he always takes his anxiety medication (Hydroxyzine HCL 25mg ) before bed (around 1am) and that when he woke up around 5am, he was "feeling hot and sweaty and nauseated" so he took another one of his anxiety medication "thinking that would help, but it didn't".

## 2023-02-17 NOTE — ED Provider Notes (Signed)
Patient feeling much better.  Initial troponin very normal at 5.  Potassium a little low at 3.3 but is been low like that before.  Renal function very normal GFR greater than 60.  Creatinine 1.23.  CBC without any acute findings.  However patient's total CK8 86.  Patient was not working out yesterday or doing strenuous work.  Patient was admitted for acute kidney injury in 2018 and it was noted that his CKs were in the thousand range.  They gave him IV fluids renal function improved but his CK never really came down below 1000.  Patient's elevated CK may be a sign of an inborn metabolic error.  Do not feel that we need to repeat it.  Since his renal function is good and the CK is below 2000.  But will recommend close follow-up with primary care doctor to have this rechecked.  Will wait for the delta troponin since patient awoke with chest discomfort this morning all chest discomfort is now resolved.  If delta troponin is not significantly elevated patient will be stable for discharge and close follow-up with primary care provider.   Vanetta Mulders, MD 02/17/23 [phone]

## 2023-02-17 NOTE — ED Provider Notes (Signed)
[place] EMERGENCY DEPARTMENT AT [facility]  Provider Note  CSN: [id number] Arrival date & time: 02/17/23 0554  History Chief Complaint  Patient presents with   Panic Attack    ADEM GAMM is a 52 y.o. male with history of HTN, DM, AKI and rhabdo reports he woke up a short time ago feeling like he was having a panic attack, described as feeling clammy, nauseated. Not having chest pain, SOB or racing heart. Checked his glucose and it was in the 90s which is usual for him. He typically take hydroxyzine at bedtime but took another dose when he woke up feeling bad without improvement. He only sleeps 2-3 hours at night, naps during the day.    Home Medications Prior to Admission medications   Medication Sig Start Date End Date Taking? Authorizing Provider  albuterol (VENTOLIN HFA) 108 (90 Base) MCG/ACT inhaler Inhale 2 puffs into the lungs every 6 (six) hours as needed for wheezing or shortness of breath. 03/08/21   Elson Areas, PA-C  ALLERGY RELIEF 10 MG tablet Take 10 mg by mouth every morning. 11/07/21   [provider]  allopurinol (ZYLOPRIM) 300 MG tablet Take 300 mg by mouth every morning.    [provider]  atorvastatin (LIPITOR) 20 MG tablet Take 20 mg by mouth daily.    [provider]  Continuous Blood Gluc Sensor (FREESTYLE LIBRE 2 SENSOR) MISC CHANGE SENSOR EVERY 14oDAYS 01/27/22   [provider]  gabapentin (NEURONTIN) 100 MG capsule Take by mouth. 12/20/22   [provider]  glipiZIDE (GLUCOTROL) 10 MG tablet Take 10 mg by mouth 2 (two) times daily. 11/09/21   [provider]  HUMULIN 70/30 KWIKPEN (70-30) 100 UNIT/ML KwikPen SMARTSIG:45 Unit(s) SUB-Q Twice Daily    [provider]  LANTUS SOLOSTAR 100 UNIT/ML Solostar Pen Inject 55 Units into the skin at bedtime. 03/12/19   [provider]  lisinopril-hydrochlorothiazide (PRINZIDE,ZESTORETIC) 10-12.5 MG per tablet Take 1 tablet by mouth  every morning.     [provider]  LITETOUCH PEN NEEDLES 31G X 8 MM MISC USING TWICE DAILY.T 03/31/19   [provider]  polyethylene glycol (MIRALAX) 17 g packet Take 17 g by mouth daily. 01/27/22   Horton, Mayer Masker, MD  Semaglutide, 2 MG/DOSE, 8 MG/3ML SOPN Inject into the skin. 12/20/22   [provider]     Allergies    Patient has no known allergies.   Review of Systems   Review of Systems Please see HPI for pertinent positives and negatives  Physical Exam BP (!) 129/94 (BP Location: Right Arm)   Pulse 83   Temp 97.7 F (36.5 C) (Oral)   Resp 18   Ht 6\' 4"  (1.93 m)   Wt 127 kg   SpO2 95%   BMI 34.08 kg/m   Physical Exam Vitals and nursing note reviewed.  Constitutional:      Appearance: Normal appearance.  HENT:     Head: Normocephalic and atraumatic.     Nose: Nose normal.     Mouth/Throat:     Mouth: Mucous membranes are moist.  Eyes:     Extraocular Movements: Extraocular movements intact.     Conjunctiva/sclera: Conjunctivae normal.  Cardiovascular:     Rate and Rhythm: Normal rate.  Pulmonary:     Effort: Pulmonary effort is normal.     Breath sounds: Normal breath sounds.  Abdominal:     General: Abdomen is flat.     Palpations: Abdomen  is soft.     Tenderness: There is no abdominal tenderness.  Musculoskeletal:        General: No swelling. Normal range of motion.     Cervical back: Neck supple.  Skin:    General: Skin is warm and dry.  Neurological:     General: No focal deficit present.     Mental Status: He is alert.  Psychiatric:        Mood and Affect: Mood normal.        Behavior: Behavior normal.     ED Results / Procedures / Treatments   EKG EKG Interpretation Date/Time:  Saturday February 17 2023 06:27:19 EST Ventricular Rate:  85 PR Interval:  157 QRS Duration:  100 QT Interval:  390 QTC Calculation: 464 R Axis:   32  Text Interpretation: Sinus rhythm Abnormal R-wave progression, early  transition No significant change since last tracing Confirmed by Susy Frizzle [phone]) on 02/17/2023 6:39:59 AM  Procedures Procedures  Medications Ordered in the ED Medications  diazepam (VALIUM) injection 2.5 mg (2.5 mg Intravenous Given 02/17/23 0625)  ondansetron (ZOFRAN) injection 4 mg (4 mg Intravenous Given 02/17/23 0625)    Initial Impression and Plan  Patient here with vague symptoms of feeling clammy and nauseated he attributes to panic attack. He appears calm and vitals are reassuring. Will check labs and EKG to rule out organic cause of symptoms. Valium for symptom relief in the meantime.   ED Course   Clinical Course as of 02/17/23 0701  Sat Feb 17, 2023  0616 CBG is not low.  [CS]  0640 CBC is unremarkable. [CS]  0700 Care of the patient signed out to oncoming team at change of shift.  [CS]    Clinical Course User Index [CS] Pollyann Savoy, MD     MDM Rules/Calculators/A&P Medical Decision Making Problems Addressed: Anxiety: acute illness or injury  Amount and/or Complexity of Data Reviewed Labs: ordered. Decision-making details documented in ED Course. ECG/medicine tests: ordered and independent interpretation performed. Decision-making details documented in ED Course.  Risk Prescription drug management.     Final Clinical Impression(s) / ED Diagnoses Final diagnoses:  Anxiety    Rx / DC Orders ED Discharge Orders     None        Pollyann Savoy, MD 02/17/23 [phone]

## 2023-02-17 NOTE — ED Notes (Signed)
Pt sleeping with significant other at bedside. Family updated. Chest rise and fall noted. Vs wnl. Awaiting re eval.

## 2023-02-17 NOTE — Discharge Instructions (Signed)
Follow-up with your primary care doctor.  You may have an inborn metabolic error that has your CKs elevated on a regular basis.  Today CK was in the 800 range.  That is not in the danger range of 2000.  Rest of your workup without any acute findings.  And your kidney function was normal today.  Return for any new or worse symptoms.  No evidence of any acute cardiac event.

## 2023-04-15 ENCOUNTER — Emergency Department (HOSPITAL_COMMUNITY)
Admission: EM | Admit: 2023-04-15 | Discharge: 2023-04-15 | Disposition: A | Discharge status: Home / Self Care | Payer: Medicaid Other | Attending: Emergency Medicine | Admitting: Emergency Medicine

## 2023-04-15 ENCOUNTER — Other Ambulatory Visit: Payer: Self-pay

## 2023-04-15 ENCOUNTER — Encounter (HOSPITAL_COMMUNITY): Payer: Self-pay

## 2023-04-15 DIAGNOSIS — I10 Essential (primary) hypertension: Secondary | ICD-10-CM | POA: Insufficient documentation

## 2023-04-15 DIAGNOSIS — N4821 Abscess of corpus cavernosum and penis: Secondary | ICD-10-CM | POA: Insufficient documentation

## 2023-04-15 DIAGNOSIS — E119 Type 2 diabetes mellitus without complications: Secondary | ICD-10-CM | POA: Diagnosis not present

## 2023-04-15 DIAGNOSIS — Z79899 Other long term (current) drug therapy: Secondary | ICD-10-CM | POA: Diagnosis not present

## 2023-04-15 DIAGNOSIS — Z7984 Long term (current) use of oral hypoglycemic drugs: Secondary | ICD-10-CM | POA: Diagnosis not present

## 2023-04-15 DIAGNOSIS — Z794 Long term (current) use of insulin: Secondary | ICD-10-CM | POA: Insufficient documentation

## 2023-04-15 MED ORDER — DOXYCYCLINE HYCLATE 100 MG PO TABS: 100.0000 mg | ORAL_TABLET | Freq: Two times a day (BID) | ORAL | 0 refills | Status: AC

## 2023-04-15 MED ORDER — CEFTRIAXONE SODIUM 500 MG IJ SOLR
500.0000 mg | Freq: Once | INTRAMUSCULAR | Status: AC
  Administered 2023-04-15: 500 mg via INTRAMUSCULAR
  Filled 2023-04-15: qty 500

## 2023-04-15 MED ORDER — LIDOCAINE HCL (PF) 1 % IJ SOLN
1.0000 mL | Freq: Once | INTRAMUSCULAR | Status: AC
  Administered 2023-04-15: 1 mL
  Filled 2023-04-15: qty 5

## 2023-04-15 NOTE — Discharge Instructions (Signed)
 There is an abscess on your penis.  You have been covered for STDs and a culture has been sent.  Follow-up with your primary care doctor and potentially urology.  Return for worsening symptoms.

## 2023-04-15 NOTE — ED Notes (Signed)
 Explained to patient about the need for a urine sample. Pt states he is unable to urinate at this time. Pt given a cup of water.

## 2023-04-15 NOTE — ED Triage Notes (Signed)
 Pt reports:  Bump/open wound Located on penis First noticed 3-4 days ago Drainage White-red color

## 2023-04-15 NOTE — ED Provider Notes (Signed)
 [place] EMERGENCY DEPARTMENT AT [facility] Provider Note   CSN: [id number] Arrival date & time: 04/15/23  9072     History  Chief Complaint  Patient presents with   Abscess    Daniel Nicholson is a 53 y.o. male.   Abscess Patient presents with mass and tenderness on the top of his penis.  Has around 3 to 4 days.  Has had some drainage.  No fevers.  Denies dysuria.  Denies STD risk.    Past Medical History:  Diagnosis Date   Acid reflux    Diabetes mellitus without complication (HCC)    Gout    Hemorrhoids    s/p right posterior, left lateral, right anterior banding. Last banding procedure Jan 2015.    Hx of anxiety disorder    Hypertension    Neck pain    Poor circulation    S/P tooth extraction 08/16/2016   three teeth extracted   Tremors of nervous system     Home Medications Prior to Admission medications   Medication Sig Start Date End Date Taking? Authorizing Provider  doxycycline  (VIBRA -TABS) 100 MG tablet Take 1 tablet (100 mg total) by mouth 2 (two) times daily for 7 days. 04/15/23 04/22/23 Yes Patsey Lot, MD  albuterol  (VENTOLIN  HFA) 108 (90 Base) MCG/ACT inhaler Inhale 2 puffs into the lungs every 6 (six) hours as needed for wheezing or shortness of breath. 03/08/21   Sofia, Leslie K, PA-C  ALLERGY RELIEF 10 MG tablet Take 10 mg by mouth every morning. 11/07/21   [provider]  allopurinol  (ZYLOPRIM ) 300 MG tablet Take 300 mg by mouth every morning.    [provider]  atorvastatin  (LIPITOR) 20 MG tablet Take 20 mg by mouth daily.    [provider]  Continuous Blood Gluc Sensor (FREESTYLE LIBRE 2 SENSOR) MISC CHANGE SENSOR EVERY 14oDAYS 01/27/22   [provider]  gabapentin  (NEURONTIN ) 100 MG capsule Take by mouth. 12/20/22   [provider]  glipiZIDE  (GLUCOTROL ) 10 MG tablet Take 10 mg by mouth 2 (two) times daily. 11/09/21   [provider]  HUMULIN 70/30 KWIKPEN (70-30) 100 UNIT/ML  KwikPen SMARTSIG:45 Unit(s) SUB-Q Twice Daily    [provider]  LANTUS SOLOSTAR 100 UNIT/ML Solostar Pen Inject 55 Units into the skin at bedtime. 03/12/19   [provider]  lisinopril-hydrochlorothiazide (PRINZIDE,ZESTORETIC) 10-12.5 MG per tablet Take 1 tablet by mouth every morning.     [provider]  LITETOUCH PEN NEEDLES 31G X 8 MM MISC USING TWICE DAILY.T 03/31/19   [provider]  polyethylene glycol (MIRALAX ) 17 g packet Take 17 g by mouth daily. 01/27/22   Horton, Charmaine FALCON, MD  Semaglutide, 2 MG/DOSE, 8 MG/3ML SOPN Inject into the skin. 12/20/22   [provider]      Allergies    Patient has no known allergies.    Review of Systems   Review of Systems  Physical Exam Updated Vital Signs BP (!) 117/91 (BP Location: Right Arm)   Pulse 89   Temp 97.7 F (36.5 C) (Oral)   Resp 18   Ht 6' 4 (1.93 m)   Wt 108.9 kg   SpO2 97%   BMI 29.21 kg/m  Physical Exam Vitals and nursing note reviewed.  Genitourinary:    Comments: Mild long dorsum of penis at the base of the head there is firmness along the skin on the distal shaft.  There is some purulent drainage with palpation.  Mild induration.  No penile discharge. Neurological:     Mental Status: He is alert.     ED Results / Procedures / Treatments   Labs (all labs ordered are listed, but only abnormal results are displayed) Labs Reviewed  AEROBIC CULTURE W GRAM STAIN (SUPERFICIAL SPECIMEN)  RPR  HIV ANTIBODY (ROUTINE TESTING W REFLEX)  GC/CHLAMYDIA PROBE AMP ([place]) NOT AT [facility]    EKG None  Radiology No results found.  Procedures Procedures    Medications Ordered in ED Medications  cefTRIAXone  (ROCEPHIN ) injection 500 mg (has no administration in time range)  lidocaine  (PF) (XYLOCAINE ) 1 % injection 1-2.1 mL (has no administration in time range)    ED Course/ Medical Decision Making/ A&P                                 Medical Decision  Making Amount and/or Complexity of Data Reviewed Labs: ordered.  Risk Prescription drug management.   Patient with apparent abscess on the dorsum of penis.  Potential skin flora versus less likely STD per patient.  Well-appearing.  Cultures been sent.  Will treat with Rocephin  and outpatient doxycycline .  Follow-up with urology and PCP.  Return for worsening symptoms.  Does not appear septic at this time.        Final Clinical Impression(s) / ED Diagnoses Final diagnoses:  Penile abscess    Rx / DC Orders ED Discharge Orders          Ordered    doxycycline  (VIBRA -TABS) 100 MG tablet  2 times daily        04/15/23 1215              Patsey Lot, MD 04/15/23 1219

## 2023-04-16 LAB — GC/CHLAMYDIA PROBE AMP (~~LOC~~) NOT AT ARMC
Chlamydia: NEGATIVE
Comment: NEGATIVE
Comment: NORMAL
Neisseria Gonorrhea: NEGATIVE

## 2023-04-16 LAB — RPR: RPR Ser Ql: NONREACTIVE

## 2023-04-16 LAB — HIV ANTIBODY (ROUTINE TESTING W REFLEX): HIV Screen 4th Generation wRfx: NONREACTIVE

## 2023-04-17 LAB — AEROBIC CULTURE W GRAM STAIN (SUPERFICIAL SPECIMEN)

## 2023-04-18 ENCOUNTER — Telehealth (HOSPITAL_BASED_OUTPATIENT_CLINIC_OR_DEPARTMENT_OTHER): Payer: Self-pay

## 2023-04-18 NOTE — Telephone Encounter (Signed)
 Post ED Visit - Positive Culture Follow-up  Culture report reviewed by antimicrobial stewardship pharmacist: [facility] [x]  Dorn Poot, Pharm.D. []  Venetia Gully, Pharm.D., BCPS AQ-ID []  Garrel Crews, Pharm.D., BCPS []  Almarie Lunger, Pharm.D., BCPS []  [place], [place].D., BCPS, AAHIVP []  Rosaline Bihari, Pharm.D., BCPS, AAHIVP []  Vernell Meier, PharmD, BCPS []  Latanya Hint, PharmD, BCPS []  Donald Medley, PharmD, BCPS []  Rocky Bold, PharmD []  Dorothyann Alert, PharmD, BCPS []  Morene Babe, PharmD  [facility] []  Rosaline Edison, PharmD []  Romona Bliss, PharmD []  Dolphus Roller, PharmD []  Veva Seip, Rph []  Vernell Daunt) Leonce, PharmD []  Eva Allis, PharmD []  Rosaline Millet, PharmD []  Iantha Batch, PharmD []  Arvin Gauss, PharmD []  Wanda Hasting, PharmD []  Ronal Rav, PharmD []  Rocky Slade, PharmD []  Bard Jeans, PharmD   Positive wound culture Treated with doxycycline , organism sensitive to the same and no further patient follow-up is required at this time.  Ruth Camelia Elbe 04/18/2023, 10:50 AM

## 2023-09-11 ENCOUNTER — Encounter: Payer: Self-pay | Admitting: Podiatry

## 2023-09-11 ENCOUNTER — Ambulatory Visit: Admitting: Podiatry

## 2023-09-11 DIAGNOSIS — B351 Tinea unguium: Secondary | ICD-10-CM | POA: Diagnosis not present

## 2023-09-11 DIAGNOSIS — L600 Ingrowing nail: Secondary | ICD-10-CM

## 2023-09-11 DIAGNOSIS — E1151 Type 2 diabetes mellitus with diabetic peripheral angiopathy without gangrene: Secondary | ICD-10-CM | POA: Diagnosis not present

## 2023-09-11 DIAGNOSIS — E119 Type 2 diabetes mellitus without complications: Secondary | ICD-10-CM

## 2023-09-11 DIAGNOSIS — M79671 Pain in right foot: Secondary | ICD-10-CM

## 2023-09-11 DIAGNOSIS — M79672 Pain in left foot: Secondary | ICD-10-CM

## 2023-09-11 DIAGNOSIS — I70209 Unspecified atherosclerosis of native arteries of extremities, unspecified extremity: Secondary | ICD-10-CM

## 2023-09-11 NOTE — Patient Instructions (Addendum)
2

## 2023-09-11 NOTE — Progress Notes (Signed)
 Patient presents for evaluation and treatment of tenderness and some redness around nails feet.  Tenderness around toes with walking and wearing shoes.  Physical exam:  General appearance: Alert, pleasant, and in no acute distress.  Vascular: Pedal pulses: DP 2/4, PT 2/4.  Mild edema lower legs bilaterally  Neurological:  Paresthesias or burning noted  Dermatologic:  Nails thickened, disfigured, discolored 1-5 BL with subungual debris.  Redness and hypertrophic nail folds along nail folds bilaterally but no signs of drainage or infection.  Musculoskeletal:  Hammertoes 2 through 5 bilaterally   Diagnosis: 1. Painful onychomycotic nails 1 through 5 bilaterally. 2. Pain toes 1 through 5 bilaterally. 3.  Ingrown toe nails bilaterally 4.  Diabetes mellitus type 2 uncomplicated  Plan: Debrided onychomycotic nails 1 through 5 bilaterally.  Return 3 months

## 2023-11-14 ENCOUNTER — Encounter (HOSPITAL_COMMUNITY): Payer: Self-pay | Admitting: Occupational Therapy

## 2023-11-14 ENCOUNTER — Ambulatory Visit (HOSPITAL_COMMUNITY): Attending: Orthopedic Surgery | Admitting: Occupational Therapy

## 2023-11-14 DIAGNOSIS — R29898 Other symptoms and signs involving the musculoskeletal system: Secondary | ICD-10-CM | POA: Diagnosis present

## 2023-11-14 DIAGNOSIS — M25612 Stiffness of left shoulder, not elsewhere classified: Secondary | ICD-10-CM | POA: Diagnosis present

## 2023-11-14 DIAGNOSIS — M25512 Pain in left shoulder: Secondary | ICD-10-CM | POA: Insufficient documentation

## 2023-11-14 NOTE — Therapy (Unsigned)
 OUTPATIENT OCCUPATIONAL THERAPY ORTHO EVALUATION  Patient Name: Daniel Nicholson MRN: [id number] DOB:March 09, 1971, 53 y.o., male Today's Date: 11/15/2023   END OF SESSION:  OT End of Session - 11/15/23 1051     Visit Number 1    Number of Visits 5    Date for OT Re-Evaluation 12/14/23    Authorization Type [employer] (requesting 4 visits)    Authorization - Visit Number 0    Authorization - Number of Visits 4    OT Start Time 1128    OT Stop Time 1153    OT Time Calculation (min) 25 min    Activity Tolerance Patient tolerated treatment well    Behavior During Therapy WFL for tasks assessed/performed          Past Medical History:  Diagnosis Date   Acid reflux    Diabetes mellitus without complication (HCC)    Gout    Hemorrhoids    s/p right posterior, left lateral, right anterior banding. Last banding procedure Jan 2015.    Hx of anxiety disorder    Hypertension    Neck pain    Poor circulation    S/P tooth extraction 08/16/2016   three teeth extracted   Tremors of nervous system    Past Surgical History:  Procedure Laterality Date   COLONOSCOPY N/A 12/16/2012   Dr. Shaaron- internal hemorrhoids otherwise normal. Screening in 2024.    COLONOSCOPY N/A 07/11/2017   Dr. Shaaron: Nonbleeding grade 1 internal hemorrhoids   COLONOSCOPY WITH PROPOFOL  N/A 02/05/2023   Procedure: COLONOSCOPY WITH PROPOFOL ;  Surgeon: Shaaron Lamar HERO, MD;  Location: [facility];  Service: Endoscopy;  Laterality: N/A;  12:30 PM, ASA 2   FLEXIBLE SIGMOIDOSCOPY N/A 03/26/2019   Procedure: FLEXIBLE SIGMOIDOSCOPY;  Surgeon: Shaaron Lamar HERO, MD;  Location: [facility];  Service: Endoscopy;  Laterality: N/A;  7:30am   HEMORRHOID BANDING     None     Patient Active Problem List   Diagnosis Date Noted   Rectal itching 12/10/2018   Rectal pain 06/19/2018   Hemorrhoids 05/14/2017   Constipation 05/14/2017   Rhabdomyolysis 12/27/2016   AKI (acute kidney injury) (HCC) 12/27/2016   Fatty liver  12/05/2012   Rectal bleeding 12/05/2012   Gross hematuria 12/05/2012   Arthritis, wrist 01/10/2011   Acid reflux    Gout    FOOT PAIN 01/30/2008    PCP: Annabella Rigg, FNP REFERRING PROVIDER: Dr. Toribio Silos  ONSET DATE: ~1 month  REFERRING DIAG: L Shoulder pain  THERAPY DIAG:  Left shoulder pain, unspecified chronicity  Stiffness of left shoulder, not elsewhere classified  Other symptoms and signs involving the musculoskeletal system  Rationale for Evaluation and Treatment: Rehabilitation  SUBJECTIVE:   SUBJECTIVE STATEMENT: I don't know what happened, it just hurts. Pt accompanied by: self and significant other  PERTINENT HISTORY: PMH significant for neck pain, shoulder pain, DM2, HTN, and HLD.   PRECAUTIONS: None  WEIGHT BEARING RESTRICTIONS: No  PAIN:  Are you having pain? No  FALLS: Has patient fallen in last 6 months? No  PLOF: Independent  PATIENT GOALS: To reduce pain and improve movement  NEXT MD VISIT: None  OBJECTIVE:   HAND DOMINANCE: Right  ADLs: Overall ADLs: Pt having difficulty with reaching overhead and behind back, limiting dressing and bathing. Pt having difficulty with lifting and carrying things, especially his young grandchild.   FUNCTIONAL OUTCOME MEASURES: Quick Dash: 34.09  UPPER EXTREMITY ROM:       Assessed in seated, er/IR  adducted  Active ROM Left eval  Shoulder flexion 136  Shoulder abduction 116 * pain  Shoulder internal rotation 90  Shoulder external rotation 72  (Blank rows = not tested)    UPPER EXTREMITY MMT:     Assessed in seated, er/IR adducted  MMT Left eval  Shoulder flexion 5/5  Shoulder abduction 4+/5  Shoulder internal rotation 5/5  Shoulder external rotation 4+/5  (Blank rows = not tested)  SENSATION: Pt reports tingling in the shoulder joint  EDEMA: No swelling noted  OBSERVATIONS: moderate to severe trigger points along the pectoralis, subscapularis, and trigger  point   TODAY'S TREATMENT:                                                                                                                              DATE:  11/14/23 -A/ROM: flexion, abduction, protraction, horizontal abduction, er/IR, x10 -Wall Slides: flexion, abduction, x10    PATIENT EDUCATION: Education details: A/ROM and [employer] Person educated: Patient Education method: Explanation, Demonstration, and Handouts Education comprehension: verbalized understanding and returned demonstration  HOME EXERCISE PROGRAM: 8/6: Wall Slides, A/ROM, Isometrics  GOALS: Goals reviewed with patient? Yes   SHORT TERM GOALS: Target date: 12/14/23  Pt will be provided with and educated on HEP to improve mobility in LUE required for use during ADL completion.   Goal status: INITIAL   LONG TERM GOALS: Target date: 12/14/23  Pt will decrease pain in LUE to 3/10 or less to improve ability to sleep for 2+ consecutive hours without waking due to pain.   Goal status: INITIAL  2.  Pt will decrease LUE fascial restrictions to min amounts or less to improve mobility required for functional reaching tasks.   Goal status: INITIAL  3.  Pt will increase LUE A/ROM by 15 degrees to improve ability to use LUE when reaching overhead or behind back during dressing and bathing tasks.   Goal status: INITIAL  4.  Pt will increase LUE strength to 5/5 or greater to improve ability to use LUE when lifting or carrying items during meal preparation/housework/yardwork tasks.   Goal status: INITIAL  5.  Pt will return to highest level of function using LUE as non-dominant during functional task completion.   Goal status: INITIAL   ASSESSMENT:  CLINICAL IMPRESSION: Patient is a 53 y.o. male who was seen today for occupational therapy evaluation for L shoulder pain. Pt presents with increased pain and fascial restrictions, decreased ROM, strength, and functional use of the LUE.   PERFORMANCE DEFICITS: in  functional skills including in functional skills including ADLs, IADLs, coordination, tone, ROM, strength, pain, fascial restrictions, muscle spasms, and UE functional use.  IMPAIRMENTS: are limiting patient from ADLs, IADLs, rest and sleep, work, leisure, and social participation.   COMORBIDITIES: has no other co-morbidities that affects occupational performance. Patient will benefit from skilled OT to address above impairments and improve overall function.  MODIFICATION OR ASSISTANCE TO COMPLETE EVALUATION: No modification of  tasks or assist necessary to complete an evaluation.  OT OCCUPATIONAL PROFILE AND HISTORY: Problem focused assessment: Including review of records relating to presenting problem.  CLINICAL DECISION MAKING: LOW - limited treatment options, no task modification necessary  REHAB POTENTIAL: Good  EVALUATION COMPLEXITY: Low      PLAN:  OT FREQUENCY: 1x/week  OT DURATION: 4 weeks  PLANNED INTERVENTIONS: 97168 OT Re-evaluation, 97535 self care/ADL training, 02889 therapeutic exercise, 97530 therapeutic activity, 97112 neuromuscular re-education, 97140 manual therapy, 97035 ultrasound, 97010 moist heat, 97032 electrical stimulation (manual), passive range of motion, balance training, functional mobility training, energy conservation, coping strategies training, patient/family education, and DME and/or AE instructions  RECOMMENDED OTHER SERVICES: N/A  CONSULTED AND AGREED WITH PLAN OF CARE: Patient  PLAN FOR NEXT SESSION: Manual Therapy, stability work, proximal shoulder exercises, strengthening   Valentin Thelbert NEILA Zelda [facility] [phone] Valentin Jillyn Thelbert, OT 2023-12-02, 10:53 AM   Managed Medicaid Authorization Request Treatment Start Date: 2023-12-02  Visit Dx Codes: M25.611, M25.612, R29.898  Functional Tool Score: Quick Dash: 34.09  For all possible CPT codes, reference the Planned Interventions line above.     Check all conditions  that are expected to impact treatment: {Conditions expected to impact treatment:None of these apply   If treatment provided at initial evaluation, no treatment charged due to lack of authorization.

## 2023-11-14 NOTE — Patient Instructions (Addendum)
 Repeat all exercises 10-15 times, 1-2 times per day.  1) Shoulder Protraction    Begin with elbows by your side, slowly punch straight out in front of you.      2) Shoulder Flexion  Supine:     Standing:         Begin with arms at your side with thumbs pointed up, slowly raise both arms up and forward towards overhead.               3) Horizontal abduction/adduction  Supine:   Standing:           Begin with arms straight out in front of you, bring out to the side in at T shape. Keep arms straight entire time.                 4) Internal & External Rotation   Supine:     Standing:     Stand with elbows at the side and elbows bent 90 degrees. Move your forearms away from your body, then bring back inward toward the body.     5) Shoulder Abduction  Supine:     Standing:       Lying on your back begin with your arms flat on the table next to your side. Slowly move your arms out to the side so that they go overhead, in a jumping jack or snow angel movement.     Complete the following 2-3 a day. Hold for 15 seconds. Complete 4-5 sets for each.   1) SHOULDER - ISOMETRIC FLEXION  Gently push your fist forward into a wall with your elbow bent.    2) SHOULDER - ISOMETRIC EXTENSION  Gently push your a bent elbow back into a wall.    3) SHOULDER - ISOMETRIC INTERNAL ROTATION   Gently press your hand into a wall using the palm side of your hand.  Maintain a bent elbow the entire time.        4) SHOULDER - ISOMETRIC ADDUCTION  Gently push your elbow into the side of your body.   5) SHOULDER - ISOMETRIC ABDUCTION  Gently push your elbow out to the side into a wall with your elbow bent.

## 2023-11-21 ENCOUNTER — Encounter (HOSPITAL_COMMUNITY): Payer: Self-pay | Admitting: Occupational Therapy

## 2023-11-21 ENCOUNTER — Ambulatory Visit (HOSPITAL_COMMUNITY): Admitting: Occupational Therapy

## 2023-11-21 DIAGNOSIS — M25512 Pain in left shoulder: Secondary | ICD-10-CM

## 2023-11-21 DIAGNOSIS — R29898 Other symptoms and signs involving the musculoskeletal system: Secondary | ICD-10-CM

## 2023-11-21 DIAGNOSIS — M25612 Stiffness of left shoulder, not elsewhere classified: Secondary | ICD-10-CM

## 2023-11-21 NOTE — Patient Instructions (Signed)

## 2023-11-21 NOTE — Therapy (Signed)
 OUTPATIENT OCCUPATIONAL THERAPY ORTHO TREATMENT NOTE  Patient Name: Daniel Nicholson MRN: [id number] DOB:January 29, 1971, 53 y.o., male Today's Date: 11/22/2023   END OF SESSION:  OT End of Session - 11/21/23 0926     Visit Number 2    Number of Visits 5    Date for OT Re-Evaluation 12/14/23    Authorization Type [employer]    Authorization Time Period 5 visits approved (11/14/23-01/12/24)    Authorization - Visit Number 1    Authorization - Number of Visits 4    OT Start Time 0847    OT Stop Time 0926    OT Time Calculation (min) 39 min    Activity Tolerance Patient tolerated treatment well    Behavior During Therapy WFL for tasks assessed/performed          Past Medical History:  Diagnosis Date   Acid reflux    Diabetes mellitus without complication (HCC)    Gout    Hemorrhoids    s/p right posterior, left lateral, right anterior banding. Last banding procedure Jan 2015.    Hx of anxiety disorder    Hypertension    Neck pain    Poor circulation    S/P tooth extraction 08/16/2016   three teeth extracted   Tremors of nervous system    Past Surgical History:  Procedure Laterality Date   COLONOSCOPY N/A 12/16/2012   Dr. Shaaron- internal hemorrhoids otherwise normal. Screening in 2024.    COLONOSCOPY N/A 07/11/2017   Dr. Shaaron: Nonbleeding grade 1 internal hemorrhoids   COLONOSCOPY WITH PROPOFOL  N/A 02/05/2023   Procedure: COLONOSCOPY WITH PROPOFOL ;  Surgeon: Shaaron Lamar HERO, MD;  Location: [facility];  Service: Endoscopy;  Laterality: N/A;  12:30 PM, ASA 2   FLEXIBLE SIGMOIDOSCOPY N/A 03/26/2019   Procedure: FLEXIBLE SIGMOIDOSCOPY;  Surgeon: Shaaron Lamar HERO, MD;  Location: [facility];  Service: Endoscopy;  Laterality: N/A;  7:30am   HEMORRHOID BANDING     None     Patient Active Problem List   Diagnosis Date Noted   Rectal itching 12/10/2018   Rectal pain 06/19/2018   Hemorrhoids 05/14/2017   Constipation 05/14/2017   Rhabdomyolysis 12/27/2016   AKI (acute  kidney injury) (HCC) 12/27/2016   Fatty liver 12/05/2012   Rectal bleeding 12/05/2012   Gross hematuria 12/05/2012   Arthritis, wrist 01/10/2011   Acid reflux    Gout    FOOT PAIN 01/30/2008    PCP: Annabella Rigg, FNP REFERRING PROVIDER: Dr. Toribio Silos  ONSET DATE: ~1 month  REFERRING DIAG: L Shoulder pain  THERAPY DIAG:  Left shoulder pain, unspecified chronicity  Stiffness of left shoulder, not elsewhere classified  Other symptoms and signs involving the musculoskeletal system  Rationale for Evaluation and Treatment: Rehabilitation  SUBJECTIVE:   SUBJECTIVE STATEMENT: My shoulders been alright. Pt accompanied by: self and significant other  PERTINENT HISTORY: PMH significant for neck pain, shoulder pain, DM2, HTN, and HLD.   PRECAUTIONS: None  WEIGHT BEARING RESTRICTIONS: No  PAIN:  Are you having pain? No  FALLS: Has patient fallen in last 6 months? No  PLOF: Independent  PATIENT GOALS: To reduce pain and improve movement  NEXT MD VISIT: None  OBJECTIVE:   HAND DOMINANCE: Right  ADLs: Overall ADLs: Pt having difficulty with reaching overhead and behind back, limiting dressing and bathing. Pt having difficulty with lifting and carrying things, especially his young grandchild.   FUNCTIONAL OUTCOME MEASURES: Quick Dash: 34.09  UPPER EXTREMITY ROM:  Assessed in seated, er/IR adducted  Active ROM Left eval  Shoulder flexion 136  Shoulder abduction 116 * pain  Shoulder internal rotation 90  Shoulder external rotation 72  (Blank rows = not tested)    UPPER EXTREMITY MMT:     Assessed in seated, er/IR adducted  MMT Left eval  Shoulder flexion 5/5  Shoulder abduction 4+/5  Shoulder internal rotation 5/5  Shoulder external rotation 4+/5  (Blank rows = not tested)  SENSATION: Pt reports tingling in the shoulder joint  EDEMA: No swelling noted  OBSERVATIONS: moderate to severe trigger points along the pectoralis,  subscapularis, and trigger point   TODAY'S TREATMENT:                                                                                                                              DATE:   11/21/23 -Manual Therapy: myofascial release and trigger point applied to biceps, pectoralis, subscapularis, and trapezius in order to reduce fascial restrictions and pain, as well as improve fascial restrictions.  -A/ROM: supine, flexion, abduction, protraction, horizontal abduction, er/IR, x10 -Proximal shoulder exercises: supine, paddles, criss cross, circles both directions, x10 each -Scapular Strengthening: green band, extension, retraction, rows, x12 -Shoulder Strengthening: green band, horizontal abduction, er, IR, x12 -ABC's on the wall, green ball -UBE, level 3, 2.5' forwards and backwards  11/14/23 -A/ROM: flexion, abduction, protraction, horizontal abduction, er/IR, x10 -Wall Slides: flexion, abduction, x10    PATIENT EDUCATION: Education details: Shoulder and scapular strengthening Person educated: Patient Education method: Explanation, Demonstration, and Handouts Education comprehension: verbalized understanding and returned demonstration  HOME EXERCISE PROGRAM: 8/6: Wall Slides, A/ROM, Isometrics 8/13: Shoulder and scapular strengthening  GOALS: Goals reviewed with patient? Yes   SHORT TERM GOALS: Target date: 12/14/23  Pt will be provided with and educated on HEP to improve mobility in LUE required for use during ADL completion.   Goal status: IN PROGRESS   LONG TERM GOALS: Target date: 12/14/23  Pt will decrease pain in LUE to 3/10 or less to improve ability to sleep for 2+ consecutive hours without waking due to pain.   Goal status: IN PROGRESS  2.  Pt will decrease LUE fascial restrictions to min amounts or less to improve mobility required for functional reaching tasks.   Goal status: IN PROGRESS  3.  Pt will increase LUE A/ROM by 15 degrees to improve ability to use LUE  when reaching overhead or behind back during dressing and bathing tasks.   Goal status: IN PROGRESS  4.  Pt will increase LUE strength to 5/5 or greater to improve ability to use LUE when lifting or carrying items during meal preparation/housework/yardwork tasks.   Goal status: IN PROGRESS  5.  Pt will return to highest level of function using LUE as non-dominant during functional task completion.   Goal status: IN PROGRESS   ASSESSMENT:  CLINICAL IMPRESSION: This session pt continuing to have pain, however was able to tolerate multiple exercises. He  did fatigue with theraband tasks and required a short rest break in between sets. His ROM was full this session as well, despite pain. OT providing verbal and tactile cuing throughout session.   PERFORMANCE DEFICITS: in functional skills including in functional skills including ADLs, IADLs, coordination, tone, ROM, strength, pain, fascial restrictions, muscle spasms, and UE functional use.   PLAN:  OT FREQUENCY: 1x/week  OT DURATION: 4 weeks  PLANNED INTERVENTIONS: 97168 OT Re-evaluation, 97535 self care/ADL training, 02889 therapeutic exercise, 97530 therapeutic activity, 97112 neuromuscular re-education, 97140 manual therapy, 97035 ultrasound, 97010 moist heat, 97032 electrical stimulation (manual), passive range of motion, balance training, functional mobility training, energy conservation, coping strategies training, patient/family education, and DME and/or AE instructions  RECOMMENDED OTHER SERVICES: N/A  CONSULTED AND AGREED WITH PLAN OF CARE: Patient  PLAN FOR NEXT SESSION: Manual Therapy, stability work, proximal shoulder exercises, strengthening   Valentin Thelbert NEILA [facility] [phone] Valentin Jillyn Thelbert, OT 11/22/2023, 8:50 AM   Managed Medicaid Authorization Request Treatment Start Date: 27-Nov-2023  Visit Dx Codes: M25.611, M25.612, R29.898  Functional Tool Score: Quick Dash: 34.09  For all  possible CPT codes, reference the Planned Interventions line above.     Check all conditions that are expected to impact treatment: {Conditions expected to impact treatment:None of these apply   If treatment provided at initial evaluation, no treatment charged due to lack of authorization.

## 2023-11-28 ENCOUNTER — Ambulatory Visit (HOSPITAL_COMMUNITY): Admitting: Occupational Therapy

## 2023-11-28 ENCOUNTER — Encounter (HOSPITAL_COMMUNITY): Payer: Self-pay | Admitting: Occupational Therapy

## 2023-11-28 DIAGNOSIS — M25512 Pain in left shoulder: Secondary | ICD-10-CM | POA: Diagnosis not present

## 2023-11-28 DIAGNOSIS — R29898 Other symptoms and signs involving the musculoskeletal system: Secondary | ICD-10-CM

## 2023-11-28 DIAGNOSIS — M25612 Stiffness of left shoulder, not elsewhere classified: Secondary | ICD-10-CM

## 2023-11-28 NOTE — Patient Instructions (Signed)

## 2023-11-28 NOTE — Therapy (Signed)
 OUTPATIENT OCCUPATIONAL THERAPY ORTHO TREATMENT NOTE  Patient Name: Daniel Nicholson MRN: [id number] DOB:[birth year], 53 y.o., male Today's Date: 11/28/2023   END OF SESSION:  OT End of Session - 11/28/23 1017     Visit Number 3    Number of Visits 5    Date for OT Re-Evaluation 12/14/23    Authorization Type [employer]    Authorization Time Period 5 visits approved (11/14/23-01/12/24)    Authorization - Visit Number 2    Authorization - Number of Visits 5    OT Start Time 0935    OT Stop Time 1015    OT Time Calculation (min) 40 min    Activity Tolerance Patient tolerated treatment well    Behavior During Therapy WFL for tasks assessed/performed           Past Medical History:  Diagnosis Date   Acid reflux    Diabetes mellitus without complication (HCC)    Gout    Hemorrhoids    s/p right posterior, left lateral, right anterior banding. Last banding procedure Jan 2015.    Hx of anxiety disorder    Hypertension    Neck pain    Poor circulation    S/P tooth extraction 08/16/2016   three teeth extracted   Tremors of nervous system    Past Surgical History:  Procedure Laterality Date   COLONOSCOPY N/A 12/16/2012   Dr. Shaaron- internal hemorrhoids otherwise normal. Screening in 2024.    COLONOSCOPY N/A 07/11/2017   Dr. Shaaron: Nonbleeding grade 1 internal hemorrhoids   COLONOSCOPY WITH PROPOFOL  N/A 02/05/2023   Procedure: COLONOSCOPY WITH PROPOFOL ;  Surgeon: Shaaron Lamar HERO, MD;  Location: [facility];  Service: Endoscopy;  Laterality: N/A;  12:30 PM, ASA 2   FLEXIBLE SIGMOIDOSCOPY N/A 03/26/2019   Procedure: FLEXIBLE SIGMOIDOSCOPY;  Surgeon: Shaaron Lamar HERO, MD;  Location: [facility];  Service: Endoscopy;  Laterality: N/A;  7:30am   HEMORRHOID BANDING     None     Patient Active Problem List   Diagnosis Date Noted   Rectal itching 12/10/2018   Rectal pain 06/19/2018   Hemorrhoids 05/14/2017   Constipation 05/14/2017   Rhabdomyolysis 12/27/2016   AKI (acute  kidney injury) (HCC) 12/27/2016   Fatty liver 12/05/2012   Rectal bleeding 12/05/2012   Gross hematuria 12/05/2012   Arthritis, wrist 01/10/2011   Acid reflux    Gout    FOOT PAIN 01/30/2008    PCP: Annabella Rigg, FNP REFERRING PROVIDER: Dr. Toribio Silos  ONSET DATE: ~1 month  REFERRING DIAG: L Shoulder pain  THERAPY DIAG:  Left shoulder pain, unspecified chronicity  Stiffness of left shoulder, not elsewhere classified  Other symptoms and signs involving the musculoskeletal system  Rationale for Evaluation and Treatment: Rehabilitation  SUBJECTIVE:   SUBJECTIVE STATEMENT: I've been doing good. Pt accompanied by: self and significant other  PERTINENT HISTORY: PMH significant for neck pain, shoulder pain, DM2, HTN, and HLD.   PRECAUTIONS: None  WEIGHT BEARING RESTRICTIONS: No  PAIN:  Are you having pain? No  FALLS: Has patient fallen in last 6 months? No  PLOF: Independent  PATIENT GOALS: To reduce pain and improve movement  NEXT MD VISIT: None  OBJECTIVE:   HAND DOMINANCE: Right  ADLs: Overall ADLs: Pt having difficulty with reaching overhead and behind back, limiting dressing and bathing. Pt having difficulty with lifting and carrying things, especially his young grandchild.   FUNCTIONAL OUTCOME MEASURES: Quick Dash: 34.09  UPPER EXTREMITY ROM:  Assessed in seated, er/IR adducted  Active ROM Left eval  Shoulder flexion 136  Shoulder abduction 116 * pain  Shoulder internal rotation 90  Shoulder external rotation 72  (Blank rows = not tested)    UPPER EXTREMITY MMT:     Assessed in seated, er/IR adducted  MMT Left eval  Shoulder flexion 5/5  Shoulder abduction 4+/5  Shoulder internal rotation 5/5  Shoulder external rotation 4+/5  (Blank rows = not tested)  SENSATION: Pt reports tingling in the shoulder joint  EDEMA: No swelling noted  OBSERVATIONS: moderate to severe trigger points along the pectoralis, subscapularis,  and trigger point   TODAY'S TREATMENT:                                                                                                                              DATE:   11/28/23 -Manual Therapy: myofascial release and trigger point applied to biceps, pectoralis, subscapularis, and trapezius in order to reduce fascial restrictions and pain, as well as improve fascial restrictions.  -A/ROM: supine, flexion, abduction, protraction, horizontal abduction, er/IR, x15 -X to V arms, x15 -Goal Post arms, x15 -Proximal shoulder exercises: supine, paddles, criss cross, circles both directions, x10 each -PNF Strengthening: green band, chest pulls, overhead pulls, er pulls, PNF up, PNF down, x 15  11/21/23 -Manual Therapy: myofascial release and trigger point applied to biceps, pectoralis, subscapularis, and trapezius in order to reduce fascial restrictions and pain, as well as improve fascial restrictions.  -A/ROM: supine, flexion, abduction, protraction, horizontal abduction, er/IR, x10 -Proximal shoulder exercises: supine, paddles, criss cross, circles both directions, x10 each -Scapular Strengthening: green band, extension, retraction, rows, x12 -Shoulder Strengthening: green band, horizontal abduction, er, IR, x12 -ABC's on the wall, green ball -UBE, level 3, 2.5' forwards and backwards  11/14/23 -A/ROM: flexion, abduction, protraction, horizontal abduction, er/IR, x10 -Wall Slides: flexion, abduction, x10    PATIENT EDUCATION: Education details: PNF strengthening Person educated: Patient Education method: Programmer, multimedia, Demonstration, and Handouts Education comprehension: verbalized understanding and returned demonstration  HOME EXERCISE PROGRAM: 8/6: Wall Slides, A/ROM, Isometrics 8/13: Shoulder and scapular strengthening 8/20: PNF Strengthening  GOALS: Goals reviewed with patient? Yes   SHORT TERM GOALS: Target date: 12/14/23  Pt will be provided with and educated on HEP to improve  mobility in LUE required for use during ADL completion.   Goal status: IN PROGRESS   LONG TERM GOALS: Target date: 12/14/23  Pt will decrease pain in LUE to 3/10 or less to improve ability to sleep for 2+ consecutive hours without waking due to pain.   Goal status: IN PROGRESS  2.  Pt will decrease LUE fascial restrictions to min amounts or less to improve mobility required for functional reaching tasks.   Goal status: IN PROGRESS  3.  Pt will increase LUE A/ROM by 15 degrees to improve ability to use LUE when reaching overhead or behind back during dressing and bathing tasks.   Goal status: IN PROGRESS  4.  Pt will increase LUE strength to 5/5 or greater to improve ability to use LUE when lifting or carrying items during meal preparation/housework/yardwork tasks.   Goal status: IN PROGRESS  5.  Pt will return to highest level of function using LUE as non-dominant during functional task completion.   Goal status: IN PROGRESS   ASSESSMENT:  CLINICAL IMPRESSION: Pt reporting no pain this session. Tolerated all exercises well with minimal fatigue with non-resistance based exercises. ROM continues to be mildly limited, around approximately 80%, specifically in flexion and abduction. OT added PNF pattern strengthening this session with no pain noted, however he did fatigue with these exercises.   PERFORMANCE DEFICITS: in functional skills including in functional skills including ADLs, IADLs, coordination, tone, ROM, strength, pain, fascial restrictions, muscle spasms, and UE functional use.   PLAN:  OT FREQUENCY: 1x/week  OT DURATION: 4 weeks  PLANNED INTERVENTIONS: 97168 OT Re-evaluation, 97535 self care/ADL training, 02889 therapeutic exercise, 97530 therapeutic activity, 97112 neuromuscular re-education, 97140 manual therapy, 97035 ultrasound, 97010 moist heat, 97032 electrical stimulation (manual), passive range of motion, balance training, functional mobility training, energy  conservation, coping strategies training, patient/family education, and DME and/or AE instructions  RECOMMENDED OTHER SERVICES: N/A  CONSULTED AND AGREED WITH PLAN OF CARE: Patient  PLAN FOR NEXT SESSION: Manual Therapy, stability work, proximal shoulder exercises, strengthening   Valentin Thelbert [facility] [phone] Rebakah Cokley Jillyn Thelbert, OT 11/28/2023, 10:18 AM   Managed Medicaid Authorization Request Treatment Start Date: 2023-11-19  Visit Dx Codes: M25.611, M25.612, R29.898  Functional Tool Score: Quick Dash: 34.09  For all possible CPT codes, reference the Planned Interventions line above.     Check all conditions that are expected to impact treatment: {Conditions expected to impact treatment:None of these apply   If treatment provided at initial evaluation, no treatment charged due to lack of authorization.

## 2023-12-05 ENCOUNTER — Encounter (HOSPITAL_COMMUNITY): Payer: Self-pay | Admitting: Occupational Therapy

## 2023-12-05 ENCOUNTER — Ambulatory Visit (HOSPITAL_COMMUNITY): Admitting: Occupational Therapy

## 2023-12-05 DIAGNOSIS — R29898 Other symptoms and signs involving the musculoskeletal system: Secondary | ICD-10-CM

## 2023-12-05 DIAGNOSIS — M25512 Pain in left shoulder: Secondary | ICD-10-CM | POA: Diagnosis not present

## 2023-12-05 DIAGNOSIS — M25612 Stiffness of left shoulder, not elsewhere classified: Secondary | ICD-10-CM

## 2023-12-05 NOTE — Therapy (Signed)
 OUTPATIENT OCCUPATIONAL THERAPY ORTHO TREATMENT NOTE  Patient Name: Daniel Nicholson MRN: [id number] DOB:02/04/1971, 53 y.o., male Today's Date: 12/05/2023   END OF SESSION:  OT End of Session - 12/05/23 1020     Visit Number 4    Number of Visits 5    Date for OT Re-Evaluation 12/14/23    Authorization Type [employer]    Authorization Time Period 5 visits approved (11/14/23-01/12/24)    Authorization - Visit Number 3    Authorization - Number of Visits 5    OT Start Time [phone]    OT Stop Time 1016    OT Time Calculation (min) 39 min    Activity Tolerance Patient tolerated treatment well    Behavior During Therapy WFL for tasks assessed/performed          Past Medical History:  Diagnosis Date   Acid reflux    Diabetes mellitus without complication (HCC)    Gout    Hemorrhoids    s/p right posterior, left lateral, right anterior banding. Last banding procedure Jan 2015.    Hx of anxiety disorder    Hypertension    Neck pain    Poor circulation    S/P tooth extraction 08/16/2016   three teeth extracted   Tremors of nervous system    Past Surgical History:  Procedure Laterality Date   COLONOSCOPY N/A 12/16/2012   Dr. Shaaron- internal hemorrhoids otherwise normal. Screening in 2024.    COLONOSCOPY N/A 07/11/2017   Dr. Shaaron: Nonbleeding grade 1 internal hemorrhoids   COLONOSCOPY WITH PROPOFOL  N/A 02/05/2023   Procedure: COLONOSCOPY WITH PROPOFOL ;  Surgeon: Shaaron Lamar HERO, MD;  Location: [facility];  Service: Endoscopy;  Laterality: N/A;  12:30 PM, ASA 2   FLEXIBLE SIGMOIDOSCOPY N/A 03/26/2019   Procedure: FLEXIBLE SIGMOIDOSCOPY;  Surgeon: Shaaron Lamar HERO, MD;  Location: [facility];  Service: Endoscopy;  Laterality: N/A;  7:30am   HEMORRHOID BANDING     None     Patient Active Problem List   Diagnosis Date Noted   Rectal itching 12/10/2018   Rectal pain 06/19/2018   Hemorrhoids 05/14/2017   Constipation 05/14/2017   Rhabdomyolysis 12/27/2016   AKI (acute  kidney injury) (HCC) 12/27/2016   Fatty liver 12/05/2012   Rectal bleeding 12/05/2012   Gross hematuria 12/05/2012   Arthritis, wrist 01/10/2011   Acid reflux    Gout    FOOT PAIN 01/30/2008    PCP: Annabella Rigg, FNP REFERRING PROVIDER: Dr. Toribio Silos  ONSET DATE: ~1 month  REFERRING DIAG: L Shoulder pain  THERAPY DIAG:  Left shoulder pain, unspecified chronicity  Stiffness of left shoulder, not elsewhere classified  Other symptoms and signs involving the musculoskeletal system  Rationale for Evaluation and Treatment: Rehabilitation  SUBJECTIVE:   SUBJECTIVE STATEMENT: It hasn't been too bad Pt accompanied by: self and significant other  PERTINENT HISTORY: PMH significant for neck pain, shoulder pain, DM2, HTN, and HLD.   PRECAUTIONS: None  WEIGHT BEARING RESTRICTIONS: No  PAIN:  Are you having pain? No  FALLS: Has patient fallen in last 6 months? No  PLOF: Independent  PATIENT GOALS: To reduce pain and improve movement  NEXT MD VISIT: None  OBJECTIVE:   HAND DOMINANCE: Right  ADLs: Overall ADLs: Pt having difficulty with reaching overhead and behind back, limiting dressing and bathing. Pt having difficulty with lifting and carrying things, especially his young grandchild.   FUNCTIONAL OUTCOME MEASURES: Quick Dash: 34.09  UPPER EXTREMITY ROM:  Assessed in seated, er/IR adducted  Active ROM Left eval  Shoulder flexion 136  Shoulder abduction 116 * pain  Shoulder internal rotation 90  Shoulder external rotation 72  (Blank rows = not tested)    UPPER EXTREMITY MMT:     Assessed in seated, er/IR adducted  MMT Left eval  Shoulder flexion 5/5  Shoulder abduction 4+/5  Shoulder internal rotation 5/5  Shoulder external rotation 4+/5  (Blank rows = not tested)  SENSATION: Pt reports tingling in the shoulder joint  EDEMA: No swelling noted  OBSERVATIONS: moderate to severe trigger points along the pectoralis, subscapularis,  and trigger point   TODAY'S TREATMENT:                                                                                                                              DATE:   12/05/23 -A/ROM: seated flexion, abduction, protraction, horizontal abduction, er/IR, x15 -X to V arms, x15 -Goal Post arms, x15 -ABC's on the wall, small green ball -Overhead lacing -scapular strengthening: green band, extension, retraction, rows, x15 -Shoulder strengthening: green band, horizontal abduction, er, IR, flexion, abduction, x15 -Stretching: flexion, corner stretch, er doorway stretch, 4x10 -UBE: level 3, 2.5' forwards and backwards  11/28/23 -Manual Therapy: myofascial release and trigger point applied to biceps, pectoralis, subscapularis, and trapezius in order to reduce fascial restrictions and pain, as well as improve fascial restrictions.  -A/ROM: supine, flexion, abduction, protraction, horizontal abduction, er/IR, x15 -X to V arms, x15 -Goal Post arms, x15 -Proximal shoulder exercises: supine, paddles, criss cross, circles both directions, x10 each -PNF Strengthening: green band, chest pulls, overhead pulls, er pulls, PNF up, PNF down, x 15  11/21/23 -Manual Therapy: myofascial release and trigger point applied to biceps, pectoralis, subscapularis, and trapezius in order to reduce fascial restrictions and pain, as well as improve fascial restrictions.  -A/ROM: supine, flexion, abduction, protraction, horizontal abduction, er/IR, x10 -Proximal shoulder exercises: supine, paddles, criss cross, circles both directions, x10 each -Scapular Strengthening: green band, extension, retraction, rows, x12 -Shoulder Strengthening: green band, horizontal abduction, er, IR, x12 -ABC's on the wall, green ball -UBE, level 3, 2.5' forwards and backwards    PATIENT EDUCATION: Education details: Stretching Person educated: Patient Education method: Explanation, Demonstration, and Handouts Education  comprehension: verbalized understanding and returned demonstration  HOME EXERCISE PROGRAM: 8/6: Wall Slides, A/ROM, Isometrics 8/13: Shoulder and scapular strengthening 8/20: PNF Strengthening 8/27: Stretching  GOALS: Goals reviewed with patient? Yes   SHORT TERM GOALS: Target date: 12/14/23  Pt will be provided with and educated on HEP to improve mobility in LUE required for use during ADL completion.   Goal status: IN PROGRESS   LONG TERM GOALS: Target date: 12/14/23  Pt will decrease pain in LUE to 3/10 or less to improve ability to sleep for 2+ consecutive hours without waking due to pain.   Goal status: IN PROGRESS  2.  Pt will decrease LUE fascial restrictions to min amounts or less  to improve mobility required for functional reaching tasks.   Goal status: IN PROGRESS  3.  Pt will increase LUE A/ROM by 15 degrees to improve ability to use LUE when reaching overhead or behind back during dressing and bathing tasks.   Goal status: IN PROGRESS  4.  Pt will increase LUE strength to 5/5 or greater to improve ability to use LUE when lifting or carrying items during meal preparation/housework/yardwork tasks.   Goal status: IN PROGRESS  5.  Pt will return to highest level of function using LUE as non-dominant during functional task completion.   Goal status: IN PROGRESS   ASSESSMENT:  CLINICAL IMPRESSION: Pt reporting minimal pain this session, however he appears more stiff. With A/ROM in sitting he achieved 80% of full ROM and had difficulty keeping his elbows extended with flexion and abduction. Abduction still elicits increased pain. He tolerated all theraband and endurance exercises well this session, however abduction with the theraband created a lasting pain requiring an increased break and stretches to reduce pain. Verbal and tactile cuing provided for positioning and technique throughout session.   PERFORMANCE DEFICITS: in functional skills including in functional  skills including ADLs, IADLs, coordination, tone, ROM, strength, pain, fascial restrictions, muscle spasms, and UE functional use.   PLAN:  OT FREQUENCY: 1x/week  OT DURATION: 4 weeks  PLANNED INTERVENTIONS: 97168 OT Re-evaluation, 97535 self care/ADL training, 02889 therapeutic exercise, 97530 therapeutic activity, 97112 neuromuscular re-education, 97140 manual therapy, 97035 ultrasound, 97010 moist heat, 97032 electrical stimulation (manual), passive range of motion, balance training, functional mobility training, energy conservation, coping strategies training, patient/family education, and DME and/or AE instructions  RECOMMENDED OTHER SERVICES: N/A  CONSULTED AND AGREED WITH PLAN OF CARE: Patient  PLAN FOR NEXT SESSION: Manual Therapy, stability work, proximal shoulder exercises, strengthening   Valentin Thelbert [facility] [phone] Valentin Jillyn Thelbert, OT 12/05/2023, 10:21 AM   Managed Medicaid Authorization Request Treatment Start Date: 12/08/23  Visit Dx Codes: M25.611, M25.612, R29.898  Functional Tool Score: Quick Dash: 34.09  For all possible CPT codes, reference the Planned Interventions line above.     Check all conditions that are expected to impact treatment: {Conditions expected to impact treatment:None of these apply   If treatment provided at initial evaluation, no treatment charged due to lack of authorization.

## 2023-12-11 ENCOUNTER — Encounter (HOSPITAL_COMMUNITY): Payer: Self-pay | Admitting: Occupational Therapy

## 2023-12-11 ENCOUNTER — Ambulatory Visit (HOSPITAL_COMMUNITY): Attending: Orthopedic Surgery | Admitting: Occupational Therapy

## 2023-12-11 DIAGNOSIS — M25612 Stiffness of left shoulder, not elsewhere classified: Secondary | ICD-10-CM | POA: Diagnosis present

## 2023-12-11 DIAGNOSIS — R29898 Other symptoms and signs involving the musculoskeletal system: Secondary | ICD-10-CM | POA: Insufficient documentation

## 2023-12-11 DIAGNOSIS — M25512 Pain in left shoulder: Secondary | ICD-10-CM | POA: Insufficient documentation

## 2023-12-11 NOTE — Therapy (Signed)
 OUTPATIENT OCCUPATIONAL THERAPY ORTHO TREATMENT NOTE DISCHARGE NOTE  Patient Name: Daniel Nicholson MRN: [id number] DOB:1970/07/11, 53 y.o., male Today's Date: 12/11/2023  OCCUPATIONAL THERAPY DISCHARGE SUMMARY  Visits from Start of Care: 5  Current functional level related to goals / functional outcomes: Pt has met all OT goals.    Remaining deficits: He continues to have moderate pain.    Education / Equipment: Pt provided comprehensive HEP.    Plan: Patient agrees to discharge as all OT goals have been met.     END OF SESSION:  OT End of Session - 12/11/23 0910     Visit Number 5    Number of Visits 5    Date for OT Re-Evaluation 12/14/23    Authorization Type [employer]    Authorization Time Period 5 visits approved (11/14/23-01/12/24)    Authorization - Visit Number 4    Authorization - Number of Visits 5    OT Start Time [phone]    OT Stop Time 0910    OT Time Calculation (min) 18 min    Activity Tolerance Patient tolerated treatment well    Behavior During Therapy WFL for tasks assessed/performed           Past Medical History:  Diagnosis Date   Acid reflux    Diabetes mellitus without complication (HCC)    Gout    Hemorrhoids    s/p right posterior, left lateral, right anterior banding. Last banding procedure Jan 2015.    Hx of anxiety disorder    Hypertension    Neck pain    Poor circulation    S/P tooth extraction 08/16/2016   three teeth extracted   Tremors of nervous system    Past Surgical History:  Procedure Laterality Date   COLONOSCOPY N/A 12/16/2012   Dr. Shaaron- internal hemorrhoids otherwise normal. Screening in 2024.    COLONOSCOPY N/A 07/11/2017   Dr. Shaaron: Nonbleeding grade 1 internal hemorrhoids   COLONOSCOPY WITH PROPOFOL  N/A 02/05/2023   Procedure: COLONOSCOPY WITH PROPOFOL ;  Surgeon: Shaaron Lamar HERO, MD;  Location: [facility];  Service: Endoscopy;  Laterality: N/A;  12:30 PM, ASA 2   FLEXIBLE SIGMOIDOSCOPY N/A 03/26/2019    Procedure: FLEXIBLE SIGMOIDOSCOPY;  Surgeon: Shaaron Lamar HERO, MD;  Location: [facility];  Service: Endoscopy;  Laterality: N/A;  7:30am   HEMORRHOID BANDING     None     Patient Active Problem List   Diagnosis Date Noted   Rectal itching 12/10/2018   Rectal pain 06/19/2018   Hemorrhoids 05/14/2017   Constipation 05/14/2017   Rhabdomyolysis 12/27/2016   AKI (acute kidney injury) (HCC) 12/27/2016   Fatty liver 12/05/2012   Rectal bleeding 12/05/2012   Gross hematuria 12/05/2012   Arthritis, wrist 01/10/2011   Acid reflux    Gout    FOOT PAIN 01/30/2008    PCP: Annabella Rigg, FNP REFERRING PROVIDER: Dr. Toribio Silos  ONSET DATE: ~1 month  REFERRING DIAG: L Shoulder pain  THERAPY DIAG:  Left shoulder pain, unspecified chronicity  Stiffness of left shoulder, not elsewhere classified  Other symptoms and signs involving the musculoskeletal system  Rationale for Evaluation and Treatment: Rehabilitation  SUBJECTIVE:   SUBJECTIVE STATEMENT: Some days I feel it more than others Pt accompanied by: self and significant other  PERTINENT HISTORY: PMH significant for neck pain, shoulder pain, DM2, HTN, and HLD.   PRECAUTIONS: None  WEIGHT BEARING RESTRICTIONS: No  PAIN:  Are you having pain? No  FALLS: Has patient fallen in last 6  months? No  PLOF: Independent  PATIENT GOALS: To reduce pain and improve movement  NEXT MD VISIT: None  OBJECTIVE:   HAND DOMINANCE: Right  ADLs: Overall ADLs: Pt having difficulty with reaching overhead and behind back, limiting dressing and bathing. Pt having difficulty with lifting and carrying things, especially his young grandchild.   FUNCTIONAL OUTCOME MEASURES: Quick Dash: 34.09  UPPER EXTREMITY ROM:       Assessed in seated, er/IR adducted  Active ROM Left eval Left 12/11/23  Shoulder flexion 136 147  Shoulder abduction 116 * pain 141  Shoulder internal rotation 90 90  Shoulder external rotation 72 86  (Blank  rows = not tested)    UPPER EXTREMITY MMT:     Assessed in seated, er/IR adducted  MMT Left eval Left 12/11/23  Shoulder flexion 5/5 5/5  Shoulder abduction 4+/5 5/5  Shoulder internal rotation 5/5 5/5  Shoulder external rotation 4+/5 5/5  (Blank rows = not tested)  SENSATION: Pt reports tingling in the shoulder joint  EDEMA: No swelling noted  OBSERVATIONS: moderate to severe trigger points along the pectoralis, subscapularis, and trigger point   TODAY'S TREATMENT:                                                                                                                              DATE:   12/11/23 -A/ROM: seated flexion, abduction, protraction, horizontal abduction, er/IR, x15 -X to V arms, x15 -Goal Post arms, x15 -Stretching: flexion, corner stretch, er doorway stretch, 4x10 -Reassessment  12/05/23 -A/ROM: seated flexion, abduction, protraction, horizontal abduction, er/IR, x15 -X to V arms, x15 -Goal Post arms, x15 -ABC's on the wall, small green ball -Overhead lacing -scapular strengthening: green band, extension, retraction, rows, x15 -Shoulder strengthening: green band, horizontal abduction, er, IR, flexion, abduction, x15 -Stretching: flexion, corner stretch, er doorway stretch, 4x10 -UBE: level 3, 2.5' forwards and backwards  11/28/23 -Manual Therapy: myofascial release and trigger point applied to biceps, pectoralis, subscapularis, and trapezius in order to reduce fascial restrictions and pain, as well as improve fascial restrictions.  -A/ROM: supine, flexion, abduction, protraction, horizontal abduction, er/IR, x15 -X to V arms, x15 -Goal Post arms, x15 -Proximal shoulder exercises: supine, paddles, criss cross, circles both directions, x10 each -PNF Strengthening: green band, chest pulls, overhead pulls, er pulls, PNF up, PNF down, x 15   PATIENT EDUCATION: Education details: Reviewed HEP Person educated: Patient Education method: Explanation,  Demonstration, and Handouts Education comprehension: verbalized understanding and returned demonstration  HOME EXERCISE PROGRAM: 8/6: Wall Slides, A/ROM, Isometrics 8/13: Shoulder and scapular strengthening 8/20: PNF Strengthening 8/27: Stretching  GOALS: Goals reviewed with patient? Yes   SHORT TERM GOALS: Target date: 12/14/23  Pt will be provided with and educated on HEP to improve mobility in LUE required for use during ADL completion.   Goal status: MET   LONG TERM GOALS: Target date: 12/14/23  Pt will decrease pain in LUE to 3/10 or less to improve ability  to sleep for 2+ consecutive hours without waking due to pain.   Goal status: NOT MET  2.  Pt will decrease LUE fascial restrictions to min amounts or less to improve mobility required for functional reaching tasks.   Goal status: MET  3.  Pt will increase LUE A/ROM by 15 degrees to improve ability to use LUE when reaching overhead or behind back during dressing and bathing tasks.   Goal status: MET  4.  Pt will increase LUE strength to 5/5 or greater to improve ability to use LUE when lifting or carrying items during meal preparation/housework/yardwork tasks.   Goal status: MET  5.  Pt will return to highest level of function using LUE as non-dominant during functional task completion.   Goal status: MET   ASSESSMENT:  CLINICAL IMPRESSION: Pt completed reassessment this session. He demonstrated good improvements with his ROM and strength. Overall he has met all OT goals, except he continues to have severe pain. Pt demonstrates good follow through with his technique and HEP. He has no further skilled OT needs at this time and will be discharged from OT.  PERFORMANCE DEFICITS: in functional skills including in functional skills including ADLs, IADLs, coordination, tone, ROM, strength, pain, fascial restrictions, muscle spasms, and UE functional use.   PLAN:  OT FREQUENCY: 1x/week  OT DURATION: 4  weeks  PLANNED INTERVENTIONS: 97168 OT Re-evaluation, 97535 self care/ADL training, 02889 therapeutic exercise, 97530 therapeutic activity, 97112 neuromuscular re-education, 97140 manual therapy, 97035 ultrasound, 97010 moist heat, 97032 electrical stimulation (manual), passive range of motion, balance training, functional mobility training, energy conservation, coping strategies training, patient/family education, and DME and/or AE instructions  RECOMMENDED OTHER SERVICES: N/A  CONSULTED AND AGREED WITH PLAN OF CARE: Patient  PLAN FOR NEXT SESSION: Manual Therapy, stability work, proximal shoulder exercises, strengthening   Valentin Thelbert NEILA [facility] [phone] Valentin Jillyn Thelbert, OT 12/11/2023, 12:36 PM   Managed [employer] Authorization Request Treatment Start Date: 29-Nov-2023  Visit Dx Codes: M25.611, M25.612, R29.898  Functional Tool Score: Quick Dash: 34.09  For all possible CPT codes, reference the Planned Interventions line above.     Check all conditions that are expected to impact treatment: {Conditions expected to impact treatment:None of these apply   If treatment provided at initial evaluation, no treatment charged due to lack of authorization.

## 2023-12-12 ENCOUNTER — Encounter: Payer: Self-pay | Admitting: Podiatry

## 2023-12-12 ENCOUNTER — Ambulatory Visit: Admitting: Podiatry

## 2023-12-12 DIAGNOSIS — B351 Tinea unguium: Secondary | ICD-10-CM | POA: Diagnosis not present

## 2023-12-12 DIAGNOSIS — M79671 Pain in right foot: Secondary | ICD-10-CM

## 2023-12-12 DIAGNOSIS — M79672 Pain in left foot: Secondary | ICD-10-CM

## 2023-12-12 NOTE — Progress Notes (Signed)
 Patient presents for evaluation and treatment of tenderness and some redness around nails feet.  Tenderness around toes with walking and wearing shoes.  Physical exam:  General appearance: Alert, pleasant, and in no acute distress.  Vascular: Pedal pulses: DP 2/4 B/L, PT 2/4 B/L. Mild edema lower legs bilaterally  Neu  Dermatologic:  Nails thickened, disfigured, discolored 1-5 BL with subungual debris.  Redness and hypertrophic nail folds along nail folds bilaterally but no signs of drainage or infection.  Musculoskeletal:     Diagnosis: 1. Painful onychomycotic nails 1 through 5 bilaterally. 2. Pain toes 1 through 5 bilaterally.  Plan: -Debrided onychomycotic nails 1 through 5 bilaterally.  Sharply debrided nails with nail clipper and reduced with a power bur.  Return 3 months [facility]

## 2024-03-12 ENCOUNTER — Ambulatory Visit: Admitting: Podiatry

## 2024-03-12 DIAGNOSIS — B351 Tinea unguium: Secondary | ICD-10-CM | POA: Diagnosis not present

## 2024-03-12 DIAGNOSIS — M79671 Pain in right foot: Secondary | ICD-10-CM

## 2024-03-12 DIAGNOSIS — M79672 Pain in left foot: Secondary | ICD-10-CM

## 2024-03-12 NOTE — Progress Notes (Signed)
 Patient presents for evaluation and treatment of tenderness and some redness around nails feet.  Tenderness around toes with walking and wearing shoes.  Physical exam:  General appearance: Alert, pleasant, and in no acute distress.  Vascular: Pedal pulses: DP 2/4 B/L, PT 2/4 B/L. Mild edema lower legs bilaterally.  Capillary refill time immediate bilaterally  Neurologic:  Dermatologic:  Nails thickened, disfigured, discolored 1-5 BL with subungual debris.  Redness and hypertrophic nail folds along nail folds bilaterally but no signs of drainage or infection.  Musculoskeletal:     Diagnosis: 1. Painful onychomycotic nails 1 through 5 bilaterally. 2. Pain toes 1 through 5 bilaterally.  Plan: -Debrided onychomycotic nails 1 through 5 bilaterally.  Sharply debrided nails with nail clipper and reduced with a power bur.  Return 3 months [facility]

## 2024-06-10 ENCOUNTER — Ambulatory Visit: Admitting: Podiatry
